# Patient Record
Sex: Female | Born: 1935 | Race: Black or African American | Hispanic: No | Smoking: Former smoker
Health system: Southern US, Community
[De-identification: ages and names within clinical notes are randomized; demographics above are authoritative.]

## PROBLEM LIST (undated history)

## (undated) DIAGNOSIS — F0390 Unspecified dementia without behavioral disturbance: Secondary | ICD-10-CM

## (undated) DIAGNOSIS — J9600 Acute respiratory failure, unspecified whether with hypoxia or hypercapnia: Secondary | ICD-10-CM

## (undated) DIAGNOSIS — J309 Allergic rhinitis, unspecified: Secondary | ICD-10-CM

## (undated) DIAGNOSIS — I4891 Unspecified atrial fibrillation: Secondary | ICD-10-CM

## (undated) DIAGNOSIS — F419 Anxiety disorder, unspecified: Secondary | ICD-10-CM

## (undated) DIAGNOSIS — J449 Chronic obstructive pulmonary disease, unspecified: Secondary | ICD-10-CM

## (undated) DIAGNOSIS — Z85038 Personal history of other malignant neoplasm of large intestine: Secondary | ICD-10-CM

## (undated) DIAGNOSIS — I10 Essential (primary) hypertension: Secondary | ICD-10-CM

## (undated) DIAGNOSIS — F39 Unspecified mood [affective] disorder: Secondary | ICD-10-CM

## (undated) DIAGNOSIS — K219 Gastro-esophageal reflux disease without esophagitis: Secondary | ICD-10-CM

## (undated) DIAGNOSIS — M810 Age-related osteoporosis without current pathological fracture: Secondary | ICD-10-CM

## (undated) DIAGNOSIS — L309 Dermatitis, unspecified: Secondary | ICD-10-CM

## (undated) DIAGNOSIS — E119 Type 2 diabetes mellitus without complications: Secondary | ICD-10-CM

## (undated) HISTORY — DX: Unspecified atrial fibrillation: I48.91

## (undated) HISTORY — DX: Gastro-esophageal reflux disease without esophagitis: K21.9

## (undated) HISTORY — DX: Anxiety disorder, unspecified: F41.9

## (undated) HISTORY — DX: Unspecified dementia, unspecified severity, without behavioral disturbance, psychotic disturbance, mood disturbance, and anxiety: F03.90

## (undated) HISTORY — DX: Age-related osteoporosis without current pathological fracture: M81.0

## (undated) HISTORY — DX: Allergic rhinitis, unspecified: J30.9

## (undated) HISTORY — DX: Acute respiratory failure, unspecified whether with hypoxia or hypercapnia: J96.00

## (undated) HISTORY — DX: Unspecified mood (affective) disorder: F39

## (undated) HISTORY — DX: Type 2 diabetes mellitus without complications: E11.9

## (undated) HISTORY — DX: Chronic obstructive pulmonary disease, unspecified: J44.9

## (undated) HISTORY — DX: Personal history of other malignant neoplasm of large intestine: Z85.038

## (undated) HISTORY — DX: Dermatitis, unspecified: L30.9

## (undated) HISTORY — DX: Essential (primary) hypertension: I10

---

## 1973-11-28 HISTORY — PX: ABDOMINAL HYSTERECTOMY: SHX81

## 2010-01-26 HISTORY — PX: UMBILICAL HERNIA REPAIR: SHX196

## 2010-01-26 HISTORY — PX: COLON SURGERY: SHX602

## 2013-04-26 DIAGNOSIS — J449 Chronic obstructive pulmonary disease, unspecified: Secondary | ICD-10-CM | POA: Diagnosis not present

## 2013-04-26 DIAGNOSIS — M25559 Pain in unspecified hip: Secondary | ICD-10-CM | POA: Diagnosis not present

## 2013-04-26 DIAGNOSIS — M25569 Pain in unspecified knee: Secondary | ICD-10-CM | POA: Diagnosis not present

## 2013-04-26 DIAGNOSIS — M545 Low back pain: Secondary | ICD-10-CM | POA: Diagnosis not present

## 2013-04-26 DIAGNOSIS — I10 Essential (primary) hypertension: Secondary | ICD-10-CM | POA: Diagnosis not present

## 2013-04-26 DIAGNOSIS — R269 Unspecified abnormalities of gait and mobility: Secondary | ICD-10-CM | POA: Diagnosis not present

## 2013-04-30 DIAGNOSIS — M25569 Pain in unspecified knee: Secondary | ICD-10-CM | POA: Diagnosis not present

## 2013-04-30 DIAGNOSIS — R269 Unspecified abnormalities of gait and mobility: Secondary | ICD-10-CM | POA: Diagnosis not present

## 2013-04-30 DIAGNOSIS — M25559 Pain in unspecified hip: Secondary | ICD-10-CM | POA: Diagnosis not present

## 2013-04-30 DIAGNOSIS — J449 Chronic obstructive pulmonary disease, unspecified: Secondary | ICD-10-CM | POA: Diagnosis not present

## 2013-04-30 DIAGNOSIS — M545 Low back pain: Secondary | ICD-10-CM | POA: Diagnosis not present

## 2013-04-30 DIAGNOSIS — I10 Essential (primary) hypertension: Secondary | ICD-10-CM | POA: Diagnosis not present

## 2013-05-07 DIAGNOSIS — M25569 Pain in unspecified knee: Secondary | ICD-10-CM | POA: Diagnosis not present

## 2013-05-07 DIAGNOSIS — M25559 Pain in unspecified hip: Secondary | ICD-10-CM | POA: Diagnosis not present

## 2013-05-07 DIAGNOSIS — I10 Essential (primary) hypertension: Secondary | ICD-10-CM | POA: Diagnosis not present

## 2013-05-07 DIAGNOSIS — R269 Unspecified abnormalities of gait and mobility: Secondary | ICD-10-CM | POA: Diagnosis not present

## 2013-05-07 DIAGNOSIS — M545 Low back pain: Secondary | ICD-10-CM | POA: Diagnosis not present

## 2013-05-07 DIAGNOSIS — J449 Chronic obstructive pulmonary disease, unspecified: Secondary | ICD-10-CM | POA: Diagnosis not present

## 2013-05-16 DIAGNOSIS — M545 Low back pain: Secondary | ICD-10-CM | POA: Diagnosis not present

## 2013-05-16 DIAGNOSIS — I10 Essential (primary) hypertension: Secondary | ICD-10-CM | POA: Diagnosis not present

## 2013-05-16 DIAGNOSIS — M25559 Pain in unspecified hip: Secondary | ICD-10-CM | POA: Diagnosis not present

## 2013-05-16 DIAGNOSIS — M25569 Pain in unspecified knee: Secondary | ICD-10-CM | POA: Diagnosis not present

## 2013-05-16 DIAGNOSIS — J449 Chronic obstructive pulmonary disease, unspecified: Secondary | ICD-10-CM | POA: Diagnosis not present

## 2013-05-16 DIAGNOSIS — R269 Unspecified abnormalities of gait and mobility: Secondary | ICD-10-CM | POA: Diagnosis not present

## 2013-05-21 DIAGNOSIS — M25559 Pain in unspecified hip: Secondary | ICD-10-CM | POA: Diagnosis not present

## 2013-05-21 DIAGNOSIS — J449 Chronic obstructive pulmonary disease, unspecified: Secondary | ICD-10-CM | POA: Diagnosis not present

## 2013-05-21 DIAGNOSIS — R269 Unspecified abnormalities of gait and mobility: Secondary | ICD-10-CM | POA: Diagnosis not present

## 2013-05-21 DIAGNOSIS — M25569 Pain in unspecified knee: Secondary | ICD-10-CM | POA: Diagnosis not present

## 2013-05-21 DIAGNOSIS — M545 Low back pain: Secondary | ICD-10-CM | POA: Diagnosis not present

## 2013-05-21 DIAGNOSIS — I10 Essential (primary) hypertension: Secondary | ICD-10-CM | POA: Diagnosis not present

## 2013-05-29 DIAGNOSIS — M25559 Pain in unspecified hip: Secondary | ICD-10-CM | POA: Diagnosis not present

## 2013-05-29 DIAGNOSIS — J449 Chronic obstructive pulmonary disease, unspecified: Secondary | ICD-10-CM | POA: Diagnosis not present

## 2013-05-29 DIAGNOSIS — R269 Unspecified abnormalities of gait and mobility: Secondary | ICD-10-CM | POA: Diagnosis not present

## 2013-05-29 DIAGNOSIS — M25569 Pain in unspecified knee: Secondary | ICD-10-CM | POA: Diagnosis not present

## 2013-05-29 DIAGNOSIS — M545 Low back pain: Secondary | ICD-10-CM | POA: Diagnosis not present

## 2013-05-29 DIAGNOSIS — I10 Essential (primary) hypertension: Secondary | ICD-10-CM | POA: Diagnosis not present

## 2013-06-03 DIAGNOSIS — R269 Unspecified abnormalities of gait and mobility: Secondary | ICD-10-CM | POA: Diagnosis not present

## 2013-06-03 DIAGNOSIS — J449 Chronic obstructive pulmonary disease, unspecified: Secondary | ICD-10-CM | POA: Diagnosis not present

## 2013-06-03 DIAGNOSIS — M25569 Pain in unspecified knee: Secondary | ICD-10-CM | POA: Diagnosis not present

## 2013-06-03 DIAGNOSIS — I10 Essential (primary) hypertension: Secondary | ICD-10-CM | POA: Diagnosis not present

## 2013-06-03 DIAGNOSIS — M25559 Pain in unspecified hip: Secondary | ICD-10-CM | POA: Diagnosis not present

## 2013-06-03 DIAGNOSIS — M545 Low back pain: Secondary | ICD-10-CM | POA: Diagnosis not present

## 2013-06-13 DIAGNOSIS — M545 Low back pain: Secondary | ICD-10-CM | POA: Diagnosis not present

## 2013-06-13 DIAGNOSIS — J449 Chronic obstructive pulmonary disease, unspecified: Secondary | ICD-10-CM | POA: Diagnosis not present

## 2013-06-13 DIAGNOSIS — I10 Essential (primary) hypertension: Secondary | ICD-10-CM | POA: Diagnosis not present

## 2013-06-13 DIAGNOSIS — M25559 Pain in unspecified hip: Secondary | ICD-10-CM | POA: Diagnosis not present

## 2013-06-13 DIAGNOSIS — R269 Unspecified abnormalities of gait and mobility: Secondary | ICD-10-CM | POA: Diagnosis not present

## 2013-06-13 DIAGNOSIS — M25569 Pain in unspecified knee: Secondary | ICD-10-CM | POA: Diagnosis not present

## 2013-06-18 DIAGNOSIS — M25559 Pain in unspecified hip: Secondary | ICD-10-CM | POA: Diagnosis not present

## 2013-06-18 DIAGNOSIS — R269 Unspecified abnormalities of gait and mobility: Secondary | ICD-10-CM | POA: Diagnosis not present

## 2013-06-18 DIAGNOSIS — M25569 Pain in unspecified knee: Secondary | ICD-10-CM | POA: Diagnosis not present

## 2013-06-18 DIAGNOSIS — J449 Chronic obstructive pulmonary disease, unspecified: Secondary | ICD-10-CM | POA: Diagnosis not present

## 2013-06-18 DIAGNOSIS — I10 Essential (primary) hypertension: Secondary | ICD-10-CM | POA: Diagnosis not present

## 2013-06-18 DIAGNOSIS — M545 Low back pain: Secondary | ICD-10-CM | POA: Diagnosis not present

## 2013-06-24 DIAGNOSIS — M25559 Pain in unspecified hip: Secondary | ICD-10-CM | POA: Diagnosis not present

## 2013-06-24 DIAGNOSIS — J449 Chronic obstructive pulmonary disease, unspecified: Secondary | ICD-10-CM | POA: Diagnosis not present

## 2013-06-24 DIAGNOSIS — M25569 Pain in unspecified knee: Secondary | ICD-10-CM | POA: Diagnosis not present

## 2013-06-24 DIAGNOSIS — R269 Unspecified abnormalities of gait and mobility: Secondary | ICD-10-CM | POA: Diagnosis not present

## 2013-06-24 DIAGNOSIS — M545 Low back pain: Secondary | ICD-10-CM | POA: Diagnosis not present

## 2013-06-24 DIAGNOSIS — I10 Essential (primary) hypertension: Secondary | ICD-10-CM | POA: Diagnosis not present

## 2014-12-02 DIAGNOSIS — H00024 Hordeolum internum left upper eyelid: Secondary | ICD-10-CM | POA: Diagnosis not present

## 2014-12-02 DIAGNOSIS — H524 Presbyopia: Secondary | ICD-10-CM | POA: Diagnosis not present

## 2014-12-02 DIAGNOSIS — H35372 Puckering of macula, left eye: Secondary | ICD-10-CM | POA: Diagnosis not present

## 2014-12-02 DIAGNOSIS — H00021 Hordeolum internum right upper eyelid: Secondary | ICD-10-CM | POA: Diagnosis not present

## 2014-12-02 DIAGNOSIS — E119 Type 2 diabetes mellitus without complications: Secondary | ICD-10-CM | POA: Diagnosis not present

## 2015-01-07 DIAGNOSIS — H6980 Other specified disorders of Eustachian tube, unspecified ear: Secondary | ICD-10-CM | POA: Diagnosis not present

## 2015-01-07 DIAGNOSIS — L219 Seborrheic dermatitis, unspecified: Secondary | ICD-10-CM | POA: Diagnosis not present

## 2015-01-07 DIAGNOSIS — H903 Sensorineural hearing loss, bilateral: Secondary | ICD-10-CM | POA: Diagnosis not present

## 2015-01-07 DIAGNOSIS — H6983 Other specified disorders of Eustachian tube, bilateral: Secondary | ICD-10-CM | POA: Diagnosis not present

## 2015-02-07 DIAGNOSIS — J449 Chronic obstructive pulmonary disease, unspecified: Secondary | ICD-10-CM | POA: Diagnosis not present

## 2015-02-07 DIAGNOSIS — Z79899 Other long term (current) drug therapy: Secondary | ICD-10-CM | POA: Diagnosis not present

## 2015-02-07 DIAGNOSIS — R040 Epistaxis: Secondary | ICD-10-CM | POA: Diagnosis not present

## 2015-02-07 DIAGNOSIS — I10 Essential (primary) hypertension: Secondary | ICD-10-CM | POA: Diagnosis not present

## 2015-02-07 DIAGNOSIS — Z7982 Long term (current) use of aspirin: Secondary | ICD-10-CM | POA: Diagnosis not present

## 2015-02-07 DIAGNOSIS — Z9981 Dependence on supplemental oxygen: Secondary | ICD-10-CM | POA: Diagnosis not present

## 2015-03-12 DIAGNOSIS — M25473 Effusion, unspecified ankle: Secondary | ICD-10-CM | POA: Diagnosis not present

## 2015-03-12 DIAGNOSIS — M79606 Pain in leg, unspecified: Secondary | ICD-10-CM | POA: Diagnosis not present

## 2015-03-12 DIAGNOSIS — I10 Essential (primary) hypertension: Secondary | ICD-10-CM | POA: Diagnosis not present

## 2015-04-08 DIAGNOSIS — I10 Essential (primary) hypertension: Secondary | ICD-10-CM | POA: Diagnosis not present

## 2015-04-08 DIAGNOSIS — J302 Other seasonal allergic rhinitis: Secondary | ICD-10-CM | POA: Diagnosis not present

## 2015-04-08 DIAGNOSIS — K6289 Other specified diseases of anus and rectum: Secondary | ICD-10-CM | POA: Diagnosis not present

## 2015-04-08 DIAGNOSIS — Z8711 Personal history of peptic ulcer disease: Secondary | ICD-10-CM | POA: Diagnosis not present

## 2015-04-08 DIAGNOSIS — Z9049 Acquired absence of other specified parts of digestive tract: Secondary | ICD-10-CM | POA: Diagnosis not present

## 2015-04-08 DIAGNOSIS — J45909 Unspecified asthma, uncomplicated: Secondary | ICD-10-CM | POA: Diagnosis not present

## 2015-04-08 DIAGNOSIS — Z86010 Personal history of colonic polyps: Secondary | ICD-10-CM | POA: Diagnosis not present

## 2015-04-08 DIAGNOSIS — Z85038 Personal history of other malignant neoplasm of large intestine: Secondary | ICD-10-CM | POA: Diagnosis not present

## 2015-04-08 DIAGNOSIS — M1990 Unspecified osteoarthritis, unspecified site: Secondary | ICD-10-CM | POA: Diagnosis not present

## 2015-04-08 DIAGNOSIS — N200 Calculus of kidney: Secondary | ICD-10-CM | POA: Diagnosis not present

## 2015-04-08 DIAGNOSIS — R1032 Left lower quadrant pain: Secondary | ICD-10-CM | POA: Diagnosis not present

## 2015-04-08 DIAGNOSIS — Z7982 Long term (current) use of aspirin: Secondary | ICD-10-CM | POA: Diagnosis not present

## 2015-04-08 DIAGNOSIS — Z79899 Other long term (current) drug therapy: Secondary | ICD-10-CM | POA: Diagnosis not present

## 2015-04-08 DIAGNOSIS — N202 Calculus of kidney with calculus of ureter: Secondary | ICD-10-CM | POA: Diagnosis not present

## 2015-04-08 DIAGNOSIS — R109 Unspecified abdominal pain: Secondary | ICD-10-CM | POA: Diagnosis not present

## 2015-04-08 DIAGNOSIS — E785 Hyperlipidemia, unspecified: Secondary | ICD-10-CM | POA: Diagnosis not present

## 2015-04-08 DIAGNOSIS — Z7951 Long term (current) use of inhaled steroids: Secondary | ICD-10-CM | POA: Diagnosis not present

## 2015-04-08 DIAGNOSIS — R110 Nausea: Secondary | ICD-10-CM | POA: Diagnosis not present

## 2015-04-08 DIAGNOSIS — E119 Type 2 diabetes mellitus without complications: Secondary | ICD-10-CM | POA: Diagnosis not present

## 2015-04-08 DIAGNOSIS — Z9981 Dependence on supplemental oxygen: Secondary | ICD-10-CM | POA: Diagnosis not present

## 2015-04-08 DIAGNOSIS — E860 Dehydration: Secondary | ICD-10-CM | POA: Diagnosis not present

## 2015-04-08 DIAGNOSIS — J449 Chronic obstructive pulmonary disease, unspecified: Secondary | ICD-10-CM | POA: Diagnosis not present

## 2015-04-08 DIAGNOSIS — K5900 Constipation, unspecified: Secondary | ICD-10-CM | POA: Diagnosis not present

## 2015-04-08 DIAGNOSIS — Z90710 Acquired absence of both cervix and uterus: Secondary | ICD-10-CM | POA: Diagnosis not present

## 2015-04-08 DIAGNOSIS — N390 Urinary tract infection, site not specified: Secondary | ICD-10-CM | POA: Diagnosis not present

## 2015-04-09 DIAGNOSIS — Z85038 Personal history of other malignant neoplasm of large intestine: Secondary | ICD-10-CM | POA: Diagnosis not present

## 2015-04-09 DIAGNOSIS — N200 Calculus of kidney: Secondary | ICD-10-CM | POA: Diagnosis not present

## 2015-04-09 DIAGNOSIS — R1032 Left lower quadrant pain: Secondary | ICD-10-CM | POA: Diagnosis not present

## 2015-04-10 DIAGNOSIS — E860 Dehydration: Secondary | ICD-10-CM | POA: Diagnosis not present

## 2015-04-10 DIAGNOSIS — N390 Urinary tract infection, site not specified: Secondary | ICD-10-CM | POA: Diagnosis not present

## 2015-04-10 DIAGNOSIS — N200 Calculus of kidney: Secondary | ICD-10-CM | POA: Diagnosis not present

## 2015-04-11 DIAGNOSIS — E860 Dehydration: Secondary | ICD-10-CM | POA: Diagnosis not present

## 2015-04-11 DIAGNOSIS — N390 Urinary tract infection, site not specified: Secondary | ICD-10-CM | POA: Diagnosis not present

## 2015-04-11 DIAGNOSIS — N200 Calculus of kidney: Secondary | ICD-10-CM | POA: Diagnosis not present

## 2015-04-12 DIAGNOSIS — J439 Emphysema, unspecified: Secondary | ICD-10-CM | POA: Diagnosis not present

## 2015-04-12 DIAGNOSIS — N200 Calculus of kidney: Secondary | ICD-10-CM | POA: Diagnosis not present

## 2015-04-12 DIAGNOSIS — N390 Urinary tract infection, site not specified: Secondary | ICD-10-CM | POA: Diagnosis not present

## 2015-04-12 DIAGNOSIS — I10 Essential (primary) hypertension: Secondary | ICD-10-CM | POA: Diagnosis not present

## 2015-04-12 DIAGNOSIS — J45909 Unspecified asthma, uncomplicated: Secondary | ICD-10-CM | POA: Diagnosis not present

## 2015-04-12 DIAGNOSIS — E119 Type 2 diabetes mellitus without complications: Secondary | ICD-10-CM | POA: Diagnosis not present

## 2015-04-15 DIAGNOSIS — J439 Emphysema, unspecified: Secondary | ICD-10-CM | POA: Diagnosis not present

## 2015-04-15 DIAGNOSIS — I10 Essential (primary) hypertension: Secondary | ICD-10-CM | POA: Diagnosis not present

## 2015-04-15 DIAGNOSIS — J45909 Unspecified asthma, uncomplicated: Secondary | ICD-10-CM | POA: Diagnosis not present

## 2015-04-15 DIAGNOSIS — N390 Urinary tract infection, site not specified: Secondary | ICD-10-CM | POA: Diagnosis not present

## 2015-04-15 DIAGNOSIS — E119 Type 2 diabetes mellitus without complications: Secondary | ICD-10-CM | POA: Diagnosis not present

## 2015-04-15 DIAGNOSIS — N200 Calculus of kidney: Secondary | ICD-10-CM | POA: Diagnosis not present

## 2015-04-16 DIAGNOSIS — N200 Calculus of kidney: Secondary | ICD-10-CM | POA: Diagnosis not present

## 2015-04-16 DIAGNOSIS — N390 Urinary tract infection, site not specified: Secondary | ICD-10-CM | POA: Diagnosis not present

## 2015-04-16 DIAGNOSIS — J45909 Unspecified asthma, uncomplicated: Secondary | ICD-10-CM | POA: Diagnosis not present

## 2015-04-16 DIAGNOSIS — J439 Emphysema, unspecified: Secondary | ICD-10-CM | POA: Diagnosis not present

## 2015-04-16 DIAGNOSIS — I10 Essential (primary) hypertension: Secondary | ICD-10-CM | POA: Diagnosis not present

## 2015-04-16 DIAGNOSIS — E119 Type 2 diabetes mellitus without complications: Secondary | ICD-10-CM | POA: Diagnosis not present

## 2015-04-18 DIAGNOSIS — E119 Type 2 diabetes mellitus without complications: Secondary | ICD-10-CM | POA: Diagnosis not present

## 2015-04-18 DIAGNOSIS — N390 Urinary tract infection, site not specified: Secondary | ICD-10-CM | POA: Diagnosis not present

## 2015-04-18 DIAGNOSIS — N200 Calculus of kidney: Secondary | ICD-10-CM | POA: Diagnosis not present

## 2015-04-18 DIAGNOSIS — I10 Essential (primary) hypertension: Secondary | ICD-10-CM | POA: Diagnosis not present

## 2015-04-18 DIAGNOSIS — J439 Emphysema, unspecified: Secondary | ICD-10-CM | POA: Diagnosis not present

## 2015-04-18 DIAGNOSIS — J45909 Unspecified asthma, uncomplicated: Secondary | ICD-10-CM | POA: Diagnosis not present

## 2015-04-21 DIAGNOSIS — I10 Essential (primary) hypertension: Secondary | ICD-10-CM | POA: Diagnosis not present

## 2015-04-21 DIAGNOSIS — J439 Emphysema, unspecified: Secondary | ICD-10-CM | POA: Diagnosis not present

## 2015-04-21 DIAGNOSIS — J45909 Unspecified asthma, uncomplicated: Secondary | ICD-10-CM | POA: Diagnosis not present

## 2015-04-21 DIAGNOSIS — N200 Calculus of kidney: Secondary | ICD-10-CM | POA: Diagnosis not present

## 2015-04-21 DIAGNOSIS — E119 Type 2 diabetes mellitus without complications: Secondary | ICD-10-CM | POA: Diagnosis not present

## 2015-04-21 DIAGNOSIS — N390 Urinary tract infection, site not specified: Secondary | ICD-10-CM | POA: Diagnosis not present

## 2015-04-23 DIAGNOSIS — J45909 Unspecified asthma, uncomplicated: Secondary | ICD-10-CM | POA: Diagnosis not present

## 2015-04-23 DIAGNOSIS — N200 Calculus of kidney: Secondary | ICD-10-CM | POA: Diagnosis not present

## 2015-04-23 DIAGNOSIS — I10 Essential (primary) hypertension: Secondary | ICD-10-CM | POA: Diagnosis not present

## 2015-04-23 DIAGNOSIS — E119 Type 2 diabetes mellitus without complications: Secondary | ICD-10-CM | POA: Diagnosis not present

## 2015-04-23 DIAGNOSIS — J439 Emphysema, unspecified: Secondary | ICD-10-CM | POA: Diagnosis not present

## 2015-04-23 DIAGNOSIS — N390 Urinary tract infection, site not specified: Secondary | ICD-10-CM | POA: Diagnosis not present

## 2015-04-24 DIAGNOSIS — Z7951 Long term (current) use of inhaled steroids: Secondary | ICD-10-CM | POA: Diagnosis not present

## 2015-04-24 DIAGNOSIS — Z79891 Long term (current) use of opiate analgesic: Secondary | ICD-10-CM | POA: Diagnosis not present

## 2015-04-24 DIAGNOSIS — S0993XA Unspecified injury of face, initial encounter: Secondary | ICD-10-CM | POA: Diagnosis not present

## 2015-04-24 DIAGNOSIS — R51 Headache: Secondary | ICD-10-CM | POA: Diagnosis not present

## 2015-04-24 DIAGNOSIS — S8001XA Contusion of right knee, initial encounter: Secondary | ICD-10-CM | POA: Diagnosis not present

## 2015-04-24 DIAGNOSIS — Z79899 Other long term (current) drug therapy: Secondary | ICD-10-CM | POA: Diagnosis not present

## 2015-04-24 DIAGNOSIS — S0083XA Contusion of other part of head, initial encounter: Secondary | ICD-10-CM | POA: Diagnosis not present

## 2015-04-24 DIAGNOSIS — W0110XA Fall on same level from slipping, tripping and stumbling with subsequent striking against unspecified object, initial encounter: Secondary | ICD-10-CM | POA: Diagnosis not present

## 2015-04-24 DIAGNOSIS — I70201 Unspecified atherosclerosis of native arteries of extremities, right leg: Secondary | ICD-10-CM | POA: Diagnosis not present

## 2015-04-24 DIAGNOSIS — M8548 Solitary bone cyst, other site: Secondary | ICD-10-CM | POA: Diagnosis not present

## 2015-04-24 DIAGNOSIS — M79662 Pain in left lower leg: Secondary | ICD-10-CM | POA: Diagnosis not present

## 2015-04-24 DIAGNOSIS — M11261 Other chondrocalcinosis, right knee: Secondary | ICD-10-CM | POA: Diagnosis not present

## 2015-04-24 DIAGNOSIS — M25561 Pain in right knee: Secondary | ICD-10-CM | POA: Diagnosis not present

## 2015-04-24 DIAGNOSIS — M79605 Pain in left leg: Secondary | ICD-10-CM | POA: Diagnosis not present

## 2015-04-25 DIAGNOSIS — I10 Essential (primary) hypertension: Secondary | ICD-10-CM | POA: Diagnosis not present

## 2015-04-25 DIAGNOSIS — J45909 Unspecified asthma, uncomplicated: Secondary | ICD-10-CM | POA: Diagnosis not present

## 2015-04-25 DIAGNOSIS — N390 Urinary tract infection, site not specified: Secondary | ICD-10-CM | POA: Diagnosis not present

## 2015-04-25 DIAGNOSIS — J439 Emphysema, unspecified: Secondary | ICD-10-CM | POA: Diagnosis not present

## 2015-04-25 DIAGNOSIS — N200 Calculus of kidney: Secondary | ICD-10-CM | POA: Diagnosis not present

## 2015-04-25 DIAGNOSIS — E119 Type 2 diabetes mellitus without complications: Secondary | ICD-10-CM | POA: Diagnosis not present

## 2015-04-28 DIAGNOSIS — I10 Essential (primary) hypertension: Secondary | ICD-10-CM | POA: Diagnosis not present

## 2015-04-28 DIAGNOSIS — E119 Type 2 diabetes mellitus without complications: Secondary | ICD-10-CM | POA: Diagnosis not present

## 2015-04-28 DIAGNOSIS — J439 Emphysema, unspecified: Secondary | ICD-10-CM | POA: Diagnosis not present

## 2015-04-28 DIAGNOSIS — N390 Urinary tract infection, site not specified: Secondary | ICD-10-CM | POA: Diagnosis not present

## 2015-04-28 DIAGNOSIS — N200 Calculus of kidney: Secondary | ICD-10-CM | POA: Diagnosis not present

## 2015-04-28 DIAGNOSIS — J45909 Unspecified asthma, uncomplicated: Secondary | ICD-10-CM | POA: Diagnosis not present

## 2015-04-29 DIAGNOSIS — E119 Type 2 diabetes mellitus without complications: Secondary | ICD-10-CM | POA: Diagnosis not present

## 2015-04-29 DIAGNOSIS — J45909 Unspecified asthma, uncomplicated: Secondary | ICD-10-CM | POA: Diagnosis not present

## 2015-04-29 DIAGNOSIS — I10 Essential (primary) hypertension: Secondary | ICD-10-CM | POA: Diagnosis not present

## 2015-04-29 DIAGNOSIS — N390 Urinary tract infection, site not specified: Secondary | ICD-10-CM | POA: Diagnosis not present

## 2015-04-29 DIAGNOSIS — N200 Calculus of kidney: Secondary | ICD-10-CM | POA: Diagnosis not present

## 2015-04-29 DIAGNOSIS — J439 Emphysema, unspecified: Secondary | ICD-10-CM | POA: Diagnosis not present

## 2015-05-02 DIAGNOSIS — N390 Urinary tract infection, site not specified: Secondary | ICD-10-CM | POA: Diagnosis not present

## 2015-05-02 DIAGNOSIS — N200 Calculus of kidney: Secondary | ICD-10-CM | POA: Diagnosis not present

## 2015-05-02 DIAGNOSIS — J439 Emphysema, unspecified: Secondary | ICD-10-CM | POA: Diagnosis not present

## 2015-05-02 DIAGNOSIS — E119 Type 2 diabetes mellitus without complications: Secondary | ICD-10-CM | POA: Diagnosis not present

## 2015-05-02 DIAGNOSIS — I10 Essential (primary) hypertension: Secondary | ICD-10-CM | POA: Diagnosis not present

## 2015-05-02 DIAGNOSIS — J45909 Unspecified asthma, uncomplicated: Secondary | ICD-10-CM | POA: Diagnosis not present

## 2015-05-05 DIAGNOSIS — I10 Essential (primary) hypertension: Secondary | ICD-10-CM | POA: Diagnosis not present

## 2015-05-05 DIAGNOSIS — J439 Emphysema, unspecified: Secondary | ICD-10-CM | POA: Diagnosis not present

## 2015-05-05 DIAGNOSIS — N200 Calculus of kidney: Secondary | ICD-10-CM | POA: Diagnosis not present

## 2015-05-05 DIAGNOSIS — N390 Urinary tract infection, site not specified: Secondary | ICD-10-CM | POA: Diagnosis not present

## 2015-05-05 DIAGNOSIS — J45909 Unspecified asthma, uncomplicated: Secondary | ICD-10-CM | POA: Diagnosis not present

## 2015-05-05 DIAGNOSIS — E119 Type 2 diabetes mellitus without complications: Secondary | ICD-10-CM | POA: Diagnosis not present

## 2015-05-07 DIAGNOSIS — I10 Essential (primary) hypertension: Secondary | ICD-10-CM | POA: Diagnosis not present

## 2015-05-07 DIAGNOSIS — E119 Type 2 diabetes mellitus without complications: Secondary | ICD-10-CM | POA: Diagnosis not present

## 2015-05-07 DIAGNOSIS — J439 Emphysema, unspecified: Secondary | ICD-10-CM | POA: Diagnosis not present

## 2015-05-07 DIAGNOSIS — J45909 Unspecified asthma, uncomplicated: Secondary | ICD-10-CM | POA: Diagnosis not present

## 2015-05-07 DIAGNOSIS — N390 Urinary tract infection, site not specified: Secondary | ICD-10-CM | POA: Diagnosis not present

## 2015-05-07 DIAGNOSIS — N200 Calculus of kidney: Secondary | ICD-10-CM | POA: Diagnosis not present

## 2015-05-12 DIAGNOSIS — I10 Essential (primary) hypertension: Secondary | ICD-10-CM | POA: Diagnosis not present

## 2015-05-12 DIAGNOSIS — J45909 Unspecified asthma, uncomplicated: Secondary | ICD-10-CM | POA: Diagnosis not present

## 2015-05-12 DIAGNOSIS — N390 Urinary tract infection, site not specified: Secondary | ICD-10-CM | POA: Diagnosis not present

## 2015-05-12 DIAGNOSIS — J439 Emphysema, unspecified: Secondary | ICD-10-CM | POA: Diagnosis not present

## 2015-05-12 DIAGNOSIS — N200 Calculus of kidney: Secondary | ICD-10-CM | POA: Diagnosis not present

## 2015-05-12 DIAGNOSIS — E119 Type 2 diabetes mellitus without complications: Secondary | ICD-10-CM | POA: Diagnosis not present

## 2015-05-13 DIAGNOSIS — E119 Type 2 diabetes mellitus without complications: Secondary | ICD-10-CM | POA: Diagnosis not present

## 2015-05-13 DIAGNOSIS — J45909 Unspecified asthma, uncomplicated: Secondary | ICD-10-CM | POA: Diagnosis not present

## 2015-05-13 DIAGNOSIS — I10 Essential (primary) hypertension: Secondary | ICD-10-CM | POA: Diagnosis not present

## 2015-05-13 DIAGNOSIS — J439 Emphysema, unspecified: Secondary | ICD-10-CM | POA: Diagnosis not present

## 2015-05-13 DIAGNOSIS — N200 Calculus of kidney: Secondary | ICD-10-CM | POA: Diagnosis not present

## 2015-05-13 DIAGNOSIS — N390 Urinary tract infection, site not specified: Secondary | ICD-10-CM | POA: Diagnosis not present

## 2015-05-15 DIAGNOSIS — N200 Calculus of kidney: Secondary | ICD-10-CM | POA: Diagnosis not present

## 2015-05-15 DIAGNOSIS — J45909 Unspecified asthma, uncomplicated: Secondary | ICD-10-CM | POA: Diagnosis not present

## 2015-05-15 DIAGNOSIS — N390 Urinary tract infection, site not specified: Secondary | ICD-10-CM | POA: Diagnosis not present

## 2015-05-15 DIAGNOSIS — I10 Essential (primary) hypertension: Secondary | ICD-10-CM | POA: Diagnosis not present

## 2015-05-15 DIAGNOSIS — E119 Type 2 diabetes mellitus without complications: Secondary | ICD-10-CM | POA: Diagnosis not present

## 2015-05-15 DIAGNOSIS — J439 Emphysema, unspecified: Secondary | ICD-10-CM | POA: Diagnosis not present

## 2015-05-19 DIAGNOSIS — E119 Type 2 diabetes mellitus without complications: Secondary | ICD-10-CM | POA: Diagnosis not present

## 2015-05-19 DIAGNOSIS — N390 Urinary tract infection, site not specified: Secondary | ICD-10-CM | POA: Diagnosis not present

## 2015-05-19 DIAGNOSIS — J439 Emphysema, unspecified: Secondary | ICD-10-CM | POA: Diagnosis not present

## 2015-05-19 DIAGNOSIS — N200 Calculus of kidney: Secondary | ICD-10-CM | POA: Diagnosis not present

## 2015-05-19 DIAGNOSIS — I10 Essential (primary) hypertension: Secondary | ICD-10-CM | POA: Diagnosis not present

## 2015-05-19 DIAGNOSIS — N202 Calculus of kidney with calculus of ureter: Secondary | ICD-10-CM | POA: Diagnosis not present

## 2015-05-19 DIAGNOSIS — J45909 Unspecified asthma, uncomplicated: Secondary | ICD-10-CM | POA: Diagnosis not present

## 2015-05-20 DIAGNOSIS — N390 Urinary tract infection, site not specified: Secondary | ICD-10-CM | POA: Diagnosis not present

## 2015-05-20 DIAGNOSIS — J439 Emphysema, unspecified: Secondary | ICD-10-CM | POA: Diagnosis not present

## 2015-05-20 DIAGNOSIS — N200 Calculus of kidney: Secondary | ICD-10-CM | POA: Diagnosis not present

## 2015-05-20 DIAGNOSIS — J45909 Unspecified asthma, uncomplicated: Secondary | ICD-10-CM | POA: Diagnosis not present

## 2015-05-20 DIAGNOSIS — I10 Essential (primary) hypertension: Secondary | ICD-10-CM | POA: Diagnosis not present

## 2015-05-20 DIAGNOSIS — E119 Type 2 diabetes mellitus without complications: Secondary | ICD-10-CM | POA: Diagnosis not present

## 2015-05-21 DIAGNOSIS — R109 Unspecified abdominal pain: Secondary | ICD-10-CM | POA: Diagnosis not present

## 2015-05-21 DIAGNOSIS — N289 Disorder of kidney and ureter, unspecified: Secondary | ICD-10-CM | POA: Diagnosis not present

## 2015-05-21 DIAGNOSIS — N200 Calculus of kidney: Secondary | ICD-10-CM | POA: Diagnosis not present

## 2015-05-22 DIAGNOSIS — J45909 Unspecified asthma, uncomplicated: Secondary | ICD-10-CM | POA: Diagnosis not present

## 2015-05-22 DIAGNOSIS — N390 Urinary tract infection, site not specified: Secondary | ICD-10-CM | POA: Diagnosis not present

## 2015-05-22 DIAGNOSIS — N200 Calculus of kidney: Secondary | ICD-10-CM | POA: Diagnosis not present

## 2015-05-22 DIAGNOSIS — I10 Essential (primary) hypertension: Secondary | ICD-10-CM | POA: Diagnosis not present

## 2015-05-22 DIAGNOSIS — E119 Type 2 diabetes mellitus without complications: Secondary | ICD-10-CM | POA: Diagnosis not present

## 2015-05-22 DIAGNOSIS — J439 Emphysema, unspecified: Secondary | ICD-10-CM | POA: Diagnosis not present

## 2015-05-25 DIAGNOSIS — R630 Anorexia: Secondary | ICD-10-CM | POA: Diagnosis not present

## 2015-05-25 DIAGNOSIS — R1013 Epigastric pain: Secondary | ICD-10-CM | POA: Diagnosis not present

## 2015-05-25 DIAGNOSIS — R109 Unspecified abdominal pain: Secondary | ICD-10-CM | POA: Diagnosis not present

## 2015-05-25 DIAGNOSIS — R634 Abnormal weight loss: Secondary | ICD-10-CM | POA: Diagnosis not present

## 2015-05-25 DIAGNOSIS — K648 Other hemorrhoids: Secondary | ICD-10-CM | POA: Diagnosis not present

## 2015-05-25 DIAGNOSIS — C189 Malignant neoplasm of colon, unspecified: Secondary | ICD-10-CM | POA: Diagnosis not present

## 2015-05-26 DIAGNOSIS — N390 Urinary tract infection, site not specified: Secondary | ICD-10-CM | POA: Diagnosis not present

## 2015-05-26 DIAGNOSIS — J45909 Unspecified asthma, uncomplicated: Secondary | ICD-10-CM | POA: Diagnosis not present

## 2015-05-26 DIAGNOSIS — I10 Essential (primary) hypertension: Secondary | ICD-10-CM | POA: Diagnosis not present

## 2015-05-26 DIAGNOSIS — N200 Calculus of kidney: Secondary | ICD-10-CM | POA: Diagnosis not present

## 2015-05-26 DIAGNOSIS — J439 Emphysema, unspecified: Secondary | ICD-10-CM | POA: Diagnosis not present

## 2015-05-26 DIAGNOSIS — E119 Type 2 diabetes mellitus without complications: Secondary | ICD-10-CM | POA: Diagnosis not present

## 2015-05-27 DIAGNOSIS — E119 Type 2 diabetes mellitus without complications: Secondary | ICD-10-CM | POA: Diagnosis not present

## 2015-05-27 DIAGNOSIS — I10 Essential (primary) hypertension: Secondary | ICD-10-CM | POA: Diagnosis not present

## 2015-05-27 DIAGNOSIS — J439 Emphysema, unspecified: Secondary | ICD-10-CM | POA: Diagnosis not present

## 2015-05-27 DIAGNOSIS — N200 Calculus of kidney: Secondary | ICD-10-CM | POA: Diagnosis not present

## 2015-05-27 DIAGNOSIS — R634 Abnormal weight loss: Secondary | ICD-10-CM | POA: Diagnosis not present

## 2015-05-27 DIAGNOSIS — J45909 Unspecified asthma, uncomplicated: Secondary | ICD-10-CM | POA: Diagnosis not present

## 2015-05-27 DIAGNOSIS — N390 Urinary tract infection, site not specified: Secondary | ICD-10-CM | POA: Diagnosis not present

## 2015-05-28 DIAGNOSIS — N390 Urinary tract infection, site not specified: Secondary | ICD-10-CM | POA: Diagnosis not present

## 2015-05-28 DIAGNOSIS — N200 Calculus of kidney: Secondary | ICD-10-CM | POA: Diagnosis not present

## 2015-05-28 DIAGNOSIS — J45909 Unspecified asthma, uncomplicated: Secondary | ICD-10-CM | POA: Diagnosis not present

## 2015-05-28 DIAGNOSIS — E119 Type 2 diabetes mellitus without complications: Secondary | ICD-10-CM | POA: Diagnosis not present

## 2015-05-28 DIAGNOSIS — J439 Emphysema, unspecified: Secondary | ICD-10-CM | POA: Diagnosis not present

## 2015-05-28 DIAGNOSIS — I10 Essential (primary) hypertension: Secondary | ICD-10-CM | POA: Diagnosis not present

## 2015-07-10 DIAGNOSIS — K259 Gastric ulcer, unspecified as acute or chronic, without hemorrhage or perforation: Secondary | ICD-10-CM | POA: Diagnosis not present

## 2015-07-10 DIAGNOSIS — R1013 Epigastric pain: Secondary | ICD-10-CM | POA: Diagnosis not present

## 2015-07-10 DIAGNOSIS — R634 Abnormal weight loss: Secondary | ICD-10-CM | POA: Diagnosis not present

## 2015-07-10 DIAGNOSIS — K449 Diaphragmatic hernia without obstruction or gangrene: Secondary | ICD-10-CM | POA: Diagnosis not present

## 2015-07-10 DIAGNOSIS — K2930 Chronic superficial gastritis without bleeding: Secondary | ICD-10-CM | POA: Diagnosis not present

## 2015-07-10 DIAGNOSIS — K2900 Acute gastritis without bleeding: Secondary | ICD-10-CM | POA: Diagnosis not present

## 2015-07-10 DIAGNOSIS — K253 Acute gastric ulcer without hemorrhage or perforation: Secondary | ICD-10-CM | POA: Diagnosis not present

## 2015-07-10 DIAGNOSIS — K319 Disease of stomach and duodenum, unspecified: Secondary | ICD-10-CM | POA: Diagnosis not present

## 2015-07-10 DIAGNOSIS — K2970 Gastritis, unspecified, without bleeding: Secondary | ICD-10-CM | POA: Diagnosis not present

## 2015-10-27 DIAGNOSIS — N200 Calculus of kidney: Secondary | ICD-10-CM | POA: Diagnosis not present

## 2015-10-27 DIAGNOSIS — R109 Unspecified abdominal pain: Secondary | ICD-10-CM | POA: Diagnosis not present

## 2015-11-10 DIAGNOSIS — N200 Calculus of kidney: Secondary | ICD-10-CM | POA: Diagnosis not present

## 2015-11-10 DIAGNOSIS — N281 Cyst of kidney, acquired: Secondary | ICD-10-CM | POA: Diagnosis not present

## 2015-12-08 DIAGNOSIS — H04123 Dry eye syndrome of bilateral lacrimal glands: Secondary | ICD-10-CM | POA: Diagnosis not present

## 2015-12-08 DIAGNOSIS — H1045 Other chronic allergic conjunctivitis: Secondary | ICD-10-CM | POA: Diagnosis not present

## 2015-12-08 DIAGNOSIS — H35373 Puckering of macula, bilateral: Secondary | ICD-10-CM | POA: Diagnosis not present

## 2015-12-08 DIAGNOSIS — H00024 Hordeolum internum left upper eyelid: Secondary | ICD-10-CM | POA: Diagnosis not present

## 2015-12-16 DIAGNOSIS — N200 Calculus of kidney: Secondary | ICD-10-CM | POA: Diagnosis not present

## 2015-12-27 DIAGNOSIS — M25569 Pain in unspecified knee: Secondary | ICD-10-CM | POA: Diagnosis not present

## 2015-12-27 DIAGNOSIS — J439 Emphysema, unspecified: Secondary | ICD-10-CM | POA: Diagnosis not present

## 2015-12-27 DIAGNOSIS — M25561 Pain in right knee: Secondary | ICD-10-CM | POA: Diagnosis not present

## 2015-12-27 DIAGNOSIS — M79604 Pain in right leg: Secondary | ICD-10-CM | POA: Diagnosis not present

## 2015-12-27 DIAGNOSIS — J45909 Unspecified asthma, uncomplicated: Secondary | ICD-10-CM | POA: Diagnosis not present

## 2015-12-27 DIAGNOSIS — L03115 Cellulitis of right lower limb: Secondary | ICD-10-CM | POA: Diagnosis not present

## 2015-12-27 DIAGNOSIS — Z7951 Long term (current) use of inhaled steroids: Secondary | ICD-10-CM | POA: Diagnosis not present

## 2015-12-27 DIAGNOSIS — Z7982 Long term (current) use of aspirin: Secondary | ICD-10-CM | POA: Diagnosis not present

## 2015-12-27 DIAGNOSIS — Z79899 Other long term (current) drug therapy: Secondary | ICD-10-CM | POA: Diagnosis not present

## 2016-01-25 DIAGNOSIS — S2242XD Multiple fractures of ribs, left side, subsequent encounter for fracture with routine healing: Secondary | ICD-10-CM | POA: Diagnosis not present

## 2016-01-25 DIAGNOSIS — J439 Emphysema, unspecified: Secondary | ICD-10-CM | POA: Diagnosis not present

## 2016-02-16 LAB — HM DEXA SCAN

## 2016-06-10 DIAGNOSIS — H6981 Other specified disorders of Eustachian tube, right ear: Secondary | ICD-10-CM | POA: Diagnosis not present

## 2016-06-10 DIAGNOSIS — J449 Chronic obstructive pulmonary disease, unspecified: Secondary | ICD-10-CM | POA: Diagnosis not present

## 2016-06-10 DIAGNOSIS — M79604 Pain in right leg: Secondary | ICD-10-CM | POA: Diagnosis not present

## 2016-09-21 DIAGNOSIS — H9201 Otalgia, right ear: Secondary | ICD-10-CM | POA: Diagnosis not present

## 2016-09-21 DIAGNOSIS — H903 Sensorineural hearing loss, bilateral: Secondary | ICD-10-CM | POA: Diagnosis not present

## 2016-09-21 DIAGNOSIS — R0982 Postnasal drip: Secondary | ICD-10-CM | POA: Diagnosis not present

## 2016-09-21 DIAGNOSIS — J301 Allergic rhinitis due to pollen: Secondary | ICD-10-CM | POA: Diagnosis not present

## 2016-12-16 DIAGNOSIS — E119 Type 2 diabetes mellitus without complications: Secondary | ICD-10-CM | POA: Diagnosis not present

## 2016-12-16 DIAGNOSIS — H1045 Other chronic allergic conjunctivitis: Secondary | ICD-10-CM | POA: Diagnosis not present

## 2016-12-16 DIAGNOSIS — H18452 Nodular corneal degeneration, left eye: Secondary | ICD-10-CM | POA: Diagnosis not present

## 2016-12-16 DIAGNOSIS — H35373 Puckering of macula, bilateral: Secondary | ICD-10-CM | POA: Diagnosis not present

## 2017-01-05 DIAGNOSIS — R319 Hematuria, unspecified: Secondary | ICD-10-CM | POA: Diagnosis not present

## 2017-01-05 DIAGNOSIS — R410 Disorientation, unspecified: Secondary | ICD-10-CM | POA: Diagnosis not present

## 2017-01-05 DIAGNOSIS — Z79899 Other long term (current) drug therapy: Secondary | ICD-10-CM | POA: Diagnosis not present

## 2017-01-05 DIAGNOSIS — N132 Hydronephrosis with renal and ureteral calculous obstruction: Secondary | ICD-10-CM | POA: Diagnosis not present

## 2017-01-05 DIAGNOSIS — R8271 Bacteriuria: Secondary | ICD-10-CM | POA: Diagnosis not present

## 2017-01-05 DIAGNOSIS — Z7982 Long term (current) use of aspirin: Secondary | ICD-10-CM | POA: Diagnosis not present

## 2017-01-05 DIAGNOSIS — R4182 Altered mental status, unspecified: Secondary | ICD-10-CM | POA: Diagnosis not present

## 2017-02-08 DIAGNOSIS — N289 Disorder of kidney and ureter, unspecified: Secondary | ICD-10-CM | POA: Diagnosis not present

## 2017-02-08 DIAGNOSIS — N200 Calculus of kidney: Secondary | ICD-10-CM | POA: Diagnosis not present

## 2017-03-20 ENCOUNTER — Other Ambulatory Visit (INDEPENDENT_AMBULATORY_CARE_PROVIDER_SITE_OTHER): Payer: Medicare Other

## 2017-03-20 ENCOUNTER — Ambulatory Visit (INDEPENDENT_AMBULATORY_CARE_PROVIDER_SITE_OTHER)
Admission: RE | Admit: 2017-03-20 | Discharge: 2017-03-20 | Disposition: A | Discharge status: Home / Self Care | Payer: Medicare Other | Source: Ambulatory Visit | Attending: Nurse Practitioner | Admitting: Nurse Practitioner

## 2017-03-20 ENCOUNTER — Encounter: Payer: Self-pay | Admitting: Nurse Practitioner

## 2017-03-20 ENCOUNTER — Ambulatory Visit (INDEPENDENT_AMBULATORY_CARE_PROVIDER_SITE_OTHER): Payer: Medicare Other | Admitting: Nurse Practitioner

## 2017-03-20 VITALS — BP 130/60 | HR 61 | Temp 98.4°F | Ht 63.0 in | Wt 111.0 lb

## 2017-03-20 DIAGNOSIS — J449 Chronic obstructive pulmonary disease, unspecified: Secondary | ICD-10-CM | POA: Diagnosis not present

## 2017-03-20 DIAGNOSIS — J310 Chronic rhinitis: Secondary | ICD-10-CM

## 2017-03-20 DIAGNOSIS — R058 Other specified cough: Secondary | ICD-10-CM

## 2017-03-20 DIAGNOSIS — R05 Cough: Secondary | ICD-10-CM

## 2017-03-20 DIAGNOSIS — R404 Transient alteration of awareness: Secondary | ICD-10-CM

## 2017-03-20 DIAGNOSIS — Z9981 Dependence on supplemental oxygen: Secondary | ICD-10-CM

## 2017-03-20 LAB — COMPREHENSIVE METABOLIC PANEL
ALT: 12 U/L (ref 0–35)
AST: 19 U/L (ref 0–37)
Albumin: 3.9 g/dL (ref 3.5–5.2)
Alkaline Phosphatase: 57 U/L (ref 39–117)
BILIRUBIN TOTAL: 0.7 mg/dL (ref 0.2–1.2)
BUN: 15 mg/dL (ref 6–23)
CALCIUM: 10.2 mg/dL (ref 8.4–10.5)
CHLORIDE: 103 meq/L (ref 96–112)
CO2: 37 meq/L — AB (ref 19–32)
Creatinine, Ser: 0.7 mg/dL (ref 0.40–1.20)
GFR: 101 mL/min (ref >60.00)
Glucose, Bld: 88 mg/dL (ref 70–99)
POTASSIUM: 4.6 meq/L (ref 3.5–5.1)
Sodium: 143 mEq/L (ref 135–145)
Total Protein: 6.5 g/dL (ref 6.0–8.3)

## 2017-03-20 LAB — CBC WITH DIFFERENTIAL/PLATELET
BASOS PCT: 0.5 % (ref 0.0–3.0)
Basophils Absolute: 0 10*3/uL (ref 0.0–0.1)
Eosinophils Absolute: 0.4 10*3/uL (ref 0.0–0.7)
Eosinophils Relative: 7.6 % — ABNORMAL HIGH (ref 0.0–5.0)
HEMATOCRIT: 38.7 % (ref 36.0–46.0)
Hemoglobin: 12.3 g/dL (ref 12.0–15.0)
LYMPHS ABS: 1.3 10*3/uL (ref 0.7–4.0)
LYMPHS PCT: 22.5 % (ref 12.0–46.0)
MCHC: 31.8 g/dL (ref 30.0–36.0)
MCV: 90.2 fl (ref 78.0–100.0)
MONOS PCT: 10.9 % (ref 3.0–12.0)
Monocytes Absolute: 0.6 10*3/uL (ref 0.1–1.0)
NEUTROS ABS: 3.3 10*3/uL (ref 1.4–7.7)
Neutrophils Relative %: 58.5 % (ref 43.0–77.0)
PLATELETS: 164 10*3/uL (ref 150.0–400.0)
RBC: 4.29 Mil/uL (ref 3.87–5.11)
RDW: 13.1 % (ref 11.5–15.5)
WBC: 5.6 10*3/uL (ref 4.0–10.5)

## 2017-03-20 LAB — TSH: TSH: 0.84 u[IU]/mL (ref 0.35–4.50)

## 2017-03-20 NOTE — Patient Instructions (Signed)
You will be contacted with appt to pulmonology.  Sign medical release to get records from previous pcp.

## 2017-03-20 NOTE — Progress Notes (Signed)
Pre visit review using our clinic review tool, if applicable. No additional management support is needed unless otherwise documented below in the visit note. 

## 2017-03-20 NOTE — Progress Notes (Signed)
Subjective:    Patient ID: Samantha Zamora, female    DOB: [birth year], 83 y.o.   MRN: [id number]  Patient presents today for establish care (new patient).  HPI  In office with son: Samantha Zamora  from Dr.  Saul Fordyce to [place]. Lives with son Samantha Zamora. Has daughter in [place].  Previous pcp with [place] seen 38months ago.  Suspected Dementia: No official diagnosis. No previous screening done  Insomnia: Sleep for 5hrs Premature waking  Naps through the day. Wakes up hyperventilating and feels like she can not breath. Witnessed by son and daughter-in-law.  COPD: Previous tobacco use. O2 dependent. advair and spiriva (not used as directed) Dependent on albuterol rescue inhaler. Uses About 20times.  Nasal congestion: treated with saline spray. Due to nasal canula  Right ear pain: Onset 09/2016.  Immunizations: (TDAP, Hep C screen, Pneumovax, Influenza, zoster)  Health Maintenance  Topic Date Due  . Tetanus Vaccine  10/13/1945  . DEXA scan (bone density measurement)  10/14/1991  . Pneumonia vaccines (1 of 2 - PCV13) 10/14/1991  . Flu Shot  06/28/2017   Weight:  Wt Readings from Last 3 Encounters:  03/20/17 111 lb (50.3 kg)   Fall Risk: Fall Risk  03/20/2017  Falls in the past year? Yes  Number falls in past yr: 1  Injury with Fall? No   Home Safety:ho e with son and daughter in law Depression/Suicide: Depression screen [facility] 2/9 03/20/2017  Decreased Interest 0  Down, Depressed, Hopeless 0  PHQ - 2 Score 0   Medications and allergies reviewed with patient and updated if appropriate.  Patient Active Problem List   Diagnosis Date Noted  . Chronic rhinitis 03/24/2017  . Transient alteration of awareness 03/24/2017  . 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 COPD (chronic obstructive pulmonary disease) ([place]) 03/20/2017    No current outpatient prescriptions on file prior to visit.   No current facility-administered medications on file prior to visit.      Past Medical History:  Diagnosis Date  . COPD (chronic obstructive pulmonary disease) ([place])   . Dementia    early stage    Past Surgical History:  Procedure Laterality Date  . COLON SURGERY    . KIDNEY STONE SURGERY      Social History   Social History  . Marital status: Widowed    Spouse name: N/A  . Number of children: N/A  . Years of education: N/A   Social History Main Topics  . Smoking status: Never Smoker  . Smokeless tobacco: Never Used  . Alcohol use No  . Drug use: No  . Sexual activity: Not Asked   Other Topics Concern  . None   Social History Narrative  . None    History reviewed. No pertinent family history.      ROS  Objective:   Vitals:   03/20/17 1436  BP: 130/60  Pulse: 61  Temp: 98.4 F (36.9 C)    Body mass index is 19.66 kg/m.   Physical Examination:  Physical Exam  Constitutional: She is oriented to person, place, and time. No distress.  Neck: Normal range of motion. Neck supple.  Cardiovascular: Normal rate, regular rhythm and normal heart sounds.   Pulmonary/Chest: Effort normal. No respiratory distress.  Diminished lung sounds  Musculoskeletal: She exhibits no edema.  Neurological: She is alert and oriented to person, place, and time.  Wheelchair  Skin: Skin is warm and dry.  Vitals reviewed.   ASSESSMENT and PLAN:  [name] was seen today  for establish care.  Diagnoses and all orders for this visit:  Transient alteration of awareness -     CBC w/Diff; Future -     Comprehensive metabolic panel; Future -     TSH; Future -     Ambulatory referral to Neurology  Chronic obstructive pulmonary disease, unspecified COPD type ([place]) -     Ambulatory referral to Pulmonology -     theophylline (THEODUR) 100 MG 12 hr tablet; Take 1 tablet (100 mg total) by mouth 2 (two) times daily. -     Fluticasone-Umeclidin-Vilant (TRELEGY ELLIPTA) 100-62.5-25 MCG/INH AEPB; Inhale 1 Inhaler into the lungs daily.  Cough with  sputum -     DG Chest 2 View; Future  Chronic rhinitis -     ipratropium (ATROVENT) 0.03 % nasal spray; Place 2 sprays into both nostrils 2 (two) times daily. Do not use for more than 5days.  O2 dependent   No problem-specific Assessment & Plan notes found for this encounter.     Follow up: Return in about 1 month (around 04/19/2017) for COPD and possible dementia.  Wilfred Lacy, NP

## 2017-03-24 DIAGNOSIS — J310 Chronic rhinitis: Secondary | ICD-10-CM | POA: Insufficient documentation

## 2017-03-24 DIAGNOSIS — Z9981 Dependence on supplemental oxygen: Secondary | ICD-10-CM | POA: Insufficient documentation

## 2017-03-24 DIAGNOSIS — R404 Transient alteration of awareness: Secondary | ICD-10-CM | POA: Insufficient documentation

## 2017-03-24 MED ORDER — FLUTICASONE-UMECLIDIN-VILANT 100-62.5-25 MCG/INH IN AEPB
1.0000 | INHALATION_SPRAY | Freq: Every day | RESPIRATORY_TRACT | 1 refills | Status: DC
Start: 2017-03-24 — End: 2017-03-28

## 2017-03-24 MED ORDER — THEOPHYLLINE ER 100 MG PO TB12: 100.0000 mg | ORAL_TABLET | Freq: Two times a day (BID) | ORAL | 1 refills | Status: DC

## 2017-03-24 MED ORDER — IPRATROPIUM BROMIDE 0.03 % NA SOLN: 2.0000 | Freq: Two times a day (BID) | NASAL | 2 refills | Status: DC

## 2017-03-27 ENCOUNTER — Encounter: Payer: Self-pay | Admitting: Neurology

## 2017-03-28 ENCOUNTER — Ambulatory Visit (INDEPENDENT_AMBULATORY_CARE_PROVIDER_SITE_OTHER): Payer: Medicare Other | Admitting: Pulmonary Disease

## 2017-03-28 ENCOUNTER — Encounter: Payer: Self-pay | Admitting: Pulmonary Disease

## 2017-03-28 DIAGNOSIS — J449 Chronic obstructive pulmonary disease, unspecified: Secondary | ICD-10-CM | POA: Diagnosis not present

## 2017-03-28 MED ORDER — FLUTICASONE-UMECLIDIN-VILANT 100-62.5-25 MCG/INH IN AEPB: 1.0000 | INHALATION_SPRAY | Freq: Every day | RESPIRATORY_TRACT | 11 refills | Status: DC

## 2017-03-28 NOTE — Addendum Note (Signed)
Addended by: Maryanna Shape A on: 03/28/2017 02:50 PM   Modules accepted: Orders

## 2017-03-28 NOTE — Progress Notes (Addendum)
Samantha Zamora    [id number]    January 08, 1924  Primary Care Physician:Charlotte Nche, NP  Referring Physician: Flossie Buffy, NP [address], [place] [address]  Chief complaint:  Consult for evaluation of COPD  HPI: Samantha Zamora has history of dementia, COPD (CAT score 28). She has been diagnosed a few years ago and is maintained on Advair and Spiriva, supplemental oxygen at 2-3 L. She gets most of her care at [facility] and is planning on moving here to be with her son. She was seen by primary care past week and started on Trilogy inhaler instead of Advair and Spiriva. She started this 2 days ago and has not noticed any alteration in her dyspnea. She is also given a new prescription for theophylline, she was not on theophylline before this. She has not started the theophylline as it was not available at the drugstore.  She reports chronic dyspnea on exertion and at res, cough, nonproductive in nature, no wheeze, fevers, chills.she reports awakening at night with dyspnea.  Outpatient Encounter Prescriptions as of 03/28/2017  Medication Sig  . Acetaminophen (TYLENOL) 325 MG CAPS Take 325 mg by mouth as needed.  Marland Kitchen albuterol (PROVENTIL HFA;VENTOLIN HFA) 108 (90 Base) MCG/ACT inhaler Inhale into the lungs every 6 (six) hours as needed for wheezing or shortness of breath.  Marland Kitchen aspirin EC 81 MG tablet Take 81 mg by mouth daily.  . fluticasone (FLONASE) 50 MCG/ACT nasal spray Place 1 spray into the nose. 50 mcg  . Fluticasone-Umeclidin-Vilant (TRELEGY ELLIPTA) 100-62.5-25 MCG/INH AEPB Inhale 1 Inhaler into the lungs daily.  Marland Kitchen ipratropium (ATROVENT) 0.03 % nasal spray Place 2 sprays into both nostrils 2 (two) times daily. Do not use for more than 5days.  . Loratadine (CLARITIN PO) Take by mouth.  . theophylline (THEODUR) 100 MG 12 hr tablet Take 1 tablet (100 mg total) by mouth 2 (two) times daily.   No facility-administered encounter medications on file as of 03/28/2017.      Allergies as of 03/28/2017 - Review Complete 03/28/2017  Allergen Reaction Noted  . Ibuprofen  06/10/2016    Past Medical History:  Diagnosis Date  . COPD (chronic obstructive pulmonary disease) ([place])   . Dementia    early stage    Past Surgical History:  Procedure Laterality Date  . COLON SURGERY      Family History  Problem Relation Age of Onset  . Family history unknown: Yes    Social History   Social History  . Marital status: Widowed    Spouse name: N/A  . Number of children: N/A  . Years of education: N/A   Occupational History  . Not on file.   Social History Main Topics  . Smoking status: Former Smoker    Packs/day: 2.00    Years: 30.00  . Smokeless tobacco: Never Used     Comment: quit smoking over 50 years ago  . Alcohol use No  . Drug use: No  . Sexual activity: Not on file   Other Topics Concern  . Not on file   Social History Narrative  . No narrative on file    Review of systems: Review of Systems  Constitutional: Negative for fever and chills.  HENT: Negative.   Eyes: Negative for blurred vision.  Respiratory: as per HPI  Cardiovascular: Negative for chest pain and palpitations.  Gastrointestinal: Negative for vomiting, diarrhea, blood per rectum. Genitourinary: Negative for dysuria, urgency, frequency and hematuria.  Musculoskeletal: Negative for myalgias, back pain and joint pain.  Skin: Negative for itching and rash.  Neurological: Negative for dizziness, tremors, focal weakness, seizures and loss of consciousness.  Endo/Heme/Allergies: Negative for environmental allergies.  Psychiatric/Behavioral: Negative for depression, suicidal ideas and hallucinations.  All other systems reviewed and are negative.  Physical Exam: Blood pressure 118/64, pulse 63, height 5\' 3"  (1.6 m), weight 114 lb (51.7 kg), SpO2 96 %. Gen:      No acute distress HEENT:  EOMI, sclera anicteric Neck:     No masses; no thyromegaly Lungs:    Clear to  auscultation bilaterally; normal respiratory effort CV:         Regular rate and rhythm; no murmurs Abd:      + bowel sounds; soft, non-tender; no palpable masses, no distension Ext:    No edema; adequate peripheral perfusion Skin:      Warm and dry; no rash Neuro: alert and oriented x 3 Psych: normal mood and affect  Data Reviewed: Chest x-ray 03/20/17- hyperinflated lungs, no acute lung infiltrate. I had reviewed the images personally.  CBC    Component Value Date/Time   WBC 5.6 03/20/2017 1606   RBC 4.29 03/20/2017 1606   HGB 12.3 03/20/2017 1606   HCT 38.7 03/20/2017 1606   PLT 164.0 03/20/2017 1606   MCV 90.2 03/20/2017 1606   MCHC 31.8 03/20/2017 1606   RDW 13.1 03/20/2017 1606   LYMPHSABS 1.3 03/20/2017 1606   MONOABS 0.6 03/20/2017 1606   EOSABS 0.4 03/20/2017 1606   BASOSABS 0.0 03/20/2017 1606    Assessment:  COPD She's been recently switched should Trelegy. We will continue the same inhalers and supplemental oxygen. She has been given theophylline but as per the family she's never been on this medication before. I have asked them to hold off on this for now. We'll get pulmonary function test for evaluation of lung function. CBC noted from last month with elevated eosinophils. This is of unclear etiology. We will continue to follow.  Plan/Recommendations: - Prescription for trelegy. Stop theophylline - Continue supplemental O2 - PFTs   Marshell Garfinkel MD  Pulmonary and Critical Care Pager [phone] 03/28/2017, 1:52 PM  CC: Nche, [name], NP =  /

## 2017-03-28 NOTE — Patient Instructions (Signed)
We will give a prescription for trelegy  Schedule PFTs Do not take the theophylline  Return in 3 months

## 2017-03-28 NOTE — Addendum Note (Signed)
Addended by: Maryanna Shape A on: 03/28/2017 02:59 PM   Modules accepted: Orders

## 2017-03-30 ENCOUNTER — Ambulatory Visit (INDEPENDENT_AMBULATORY_CARE_PROVIDER_SITE_OTHER): Payer: Medicare Other | Admitting: Pulmonary Disease

## 2017-03-30 ENCOUNTER — Telehealth: Payer: Self-pay | Admitting: Pulmonary Disease

## 2017-03-30 DIAGNOSIS — J449 Chronic obstructive pulmonary disease, unspecified: Secondary | ICD-10-CM | POA: Diagnosis not present

## 2017-03-30 NOTE — Telephone Encounter (Signed)
Spoke with pt's son Samantha Zamora (dpr on file), wants to verify that we have placed an order to [facility] for a POC.  I verified that this order was placed.  Nothing further needed.

## 2017-03-30 NOTE — Progress Notes (Signed)
PFT done today. 

## 2017-03-31 ENCOUNTER — Ambulatory Visit (INDEPENDENT_AMBULATORY_CARE_PROVIDER_SITE_OTHER): Payer: Medicare Other | Admitting: Neurology

## 2017-03-31 ENCOUNTER — Encounter: Payer: Self-pay | Admitting: Neurology

## 2017-03-31 ENCOUNTER — Other Ambulatory Visit: Payer: Self-pay | Admitting: Neurology

## 2017-03-31 VITALS — BP 114/58 | HR 91 | Temp 98.0°F | Ht 63.0 in | Wt 114.0 lb

## 2017-03-31 DIAGNOSIS — F419 Anxiety disorder, unspecified: Secondary | ICD-10-CM | POA: Insufficient documentation

## 2017-03-31 DIAGNOSIS — F3289 Other specified depressive episodes: Secondary | ICD-10-CM

## 2017-03-31 DIAGNOSIS — F329 Major depressive disorder, single episode, unspecified: Secondary | ICD-10-CM | POA: Insufficient documentation

## 2017-03-31 DIAGNOSIS — F0390 Unspecified dementia without behavioral disturbance: Secondary | ICD-10-CM

## 2017-03-31 DIAGNOSIS — F03A0 Unspecified dementia, mild, without behavioral disturbance, psychotic disturbance, mood disturbance, and anxiety: Secondary | ICD-10-CM

## 2017-03-31 DIAGNOSIS — F32A Depression, unspecified: Secondary | ICD-10-CM | POA: Insufficient documentation

## 2017-03-31 MED ORDER — MIRTAZAPINE 15 MG PO TABS: ORAL_TABLET | ORAL | 3 refills | Status: DC

## 2017-03-31 MED ORDER — MIRTAZAPINE 15 MG PO TABS: ORAL_TABLET | ORAL | 6 refills | Status: DC

## 2017-03-31 NOTE — Patient Instructions (Signed)
1. Start mirtazapine 15mg : take 1/2 tablet at night for 2 weeks, if doing well stay on the same dose. If still with issues, increase to 1 tablet at night 2. Physical exercise and brain stimulation exercises are important for brain health 3. Follow-up in 6 months, call for any changes
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
## 2017-04-07 ENCOUNTER — Telehealth: Payer: Self-pay | Admitting: Pulmonary Disease

## 2017-04-07 LAB — PULMONARY FUNCTION TEST
FEF 25-75 POST: 0.32 L/s
FEF 25-75 Pre: 0.24 L/sec
FEF2575-%CHANGE-POST: 32 %
FEF2575-%PRED-POST: 30 %
FEF2575-%Pred-Pre: 22 %
FEV1-%CHANGE-POST: -2 %
FEV1-%Pred-Post: 42 %
FEV1-%Pred-Pre: 43 %
FEV1-Post: 0.61 L
FEV1-Pre: 0.63 L
FEV1FVC-%Change-Post: -14 %
FEV1FVC-%PRED-PRE: 72 %
FEV6-%Change-Post: 21 %
FEV6-%Pred-Post: 75 %
FEV6-%Pred-Pre: 62 %
FEV6-POST: 1.34 L
FEV6-Pre: 1.1 L
FEV6FVC-%Change-Post: 4 %
FEV6FVC-%PRED-POST: 102 %
FEV6FVC-%Pred-Pre: 98 %
FVC-%Change-Post: 13 %
FVC-%PRED-PRE: 64 %
FVC-%Pred-Post: 73 %
FVC-POST: 1.35 L
FVC-PRE: 1.19 L
POST FEV1/FVC RATIO: 45 %
PRE FEV6/FVC RATIO: 95 %
Post FEV6/FVC ratio: 99 %
Pre FEV1/FVC ratio: 53 %

## 2017-04-07 NOTE — Telephone Encounter (Signed)
lmomtcb x 1 jerome (pts son)

## 2017-04-07 NOTE — Telephone Encounter (Signed)
PM I spoke with Daneil Dan and she stated that if you go into breeze you can review and sign it and it should flow over into epic.  If not then let us know and we can get the copy printed off and place this in your look at folder.  thanks

## 2017-04-07 NOTE — Telephone Encounter (Signed)
PFTs confirm severe COPD. Continue current therapy with inhalers.  PM

## 2017-04-07 NOTE — Telephone Encounter (Signed)
I don't see the PFT test in EPIC or my in box. Please call PFTs lab to verify it was uploaded.  Samantha Zamora

## 2017-04-07 NOTE — Telephone Encounter (Signed)
pts son is calling for the results of the pts PFT.  PM please advise. thanks

## 2017-04-10 NOTE — Telephone Encounter (Signed)
Called and spoke with pts son jerome and he is aware of results per PM.

## 2017-04-11 ENCOUNTER — Telehealth: Payer: Self-pay | Admitting: Pulmonary Disease

## 2017-04-11 DIAGNOSIS — J449 Chronic obstructive pulmonary disease, unspecified: Secondary | ICD-10-CM

## 2017-04-11 NOTE — Telephone Encounter (Signed)
Called and spoke to another rep from [employer]. She states they are going to fax over a form that needs to be signed by PM for pt's O2.   Will forward to [facility] to look out for.

## 2017-04-17 NOTE — Telephone Encounter (Signed)
Margie have you received the forms yet?

## 2017-04-19 NOTE — Telephone Encounter (Signed)
Form has not been received. I have attempted to call [facility] and was placed on a long hold. Will call back.

## 2017-04-20 NOTE — Telephone Encounter (Signed)
Called and spoke with Samantha Zamora and they will fax over the forms to the triage fax.

## 2017-04-25 NOTE — Telephone Encounter (Signed)
Samantha Zamora have any forms from [place] been received as of today for PM to sign.  Thanks

## 2017-04-26 NOTE — Telephone Encounter (Signed)
I have not received any forms from [place].

## 2017-04-27 ENCOUNTER — Ambulatory Visit: Payer: TRICARE For Life (TFL) | Admitting: Pulmonary Disease

## 2017-04-27 NOTE — Telephone Encounter (Signed)
Samantha Zamora have you received any forms from [place] on this pt?

## 2017-05-01 NOTE — Telephone Encounter (Signed)
Called [facility] and spoke with Tammy who reported that there is nothing outstanding for this patient.  All Huey Romans needs is a prescription for the POC (though this order has already been placed).  Will place an additional for POC to see if this takes care of the issue.    Order placed Will sign off

## 2017-05-01 NOTE — Telephone Encounter (Signed)
I have just checked all the [employer] forms that I have and I don't have anything for this patient

## 2017-05-03 ENCOUNTER — Other Ambulatory Visit: Payer: Self-pay

## 2017-05-03 ENCOUNTER — Telehealth: Payer: Self-pay | Admitting: Neurology

## 2017-05-03 DIAGNOSIS — F419 Anxiety disorder, unspecified: Secondary | ICD-10-CM

## 2017-05-03 DIAGNOSIS — F3289 Other specified depressive episodes: Secondary | ICD-10-CM

## 2017-05-03 MED ORDER — MIRTAZAPINE 15 MG PO TABS: ORAL_TABLET | ORAL | 3 refills | Status: DC

## 2017-05-03 NOTE — Telephone Encounter (Signed)
Son called and states that they tried the half pill and it did not work so they went to the whole pill like Dr Delice Lesch said they could. It is working much better but they will need a new rx called into the [facility] for the Mirtazapine patient son states they will be out of medication today please call when you have called in the RX to let him know

## 2017-05-03 NOTE — Telephone Encounter (Signed)
Spoke with son.  He states that pt is doing great with 1 tab qhs.  Sent new Rx to primary pharmacy with updated dosage.  90 day supply with 3 refills.

## 2017-05-25 ENCOUNTER — Ambulatory Visit: Payer: TRICARE For Life (TFL) | Admitting: Neurology

## 2017-05-29 ENCOUNTER — Encounter: Payer: Self-pay | Admitting: Nurse Practitioner

## 2017-06-01 ENCOUNTER — Encounter: Payer: Self-pay | Admitting: Nurse Practitioner

## 2017-06-01 NOTE — Progress Notes (Signed)
Abstracted result and sent to scan  

## 2017-06-07 ENCOUNTER — Other Ambulatory Visit: Payer: Self-pay | Admitting: Nurse Practitioner

## 2017-06-07 DIAGNOSIS — J449 Chronic obstructive pulmonary disease, unspecified: Secondary | ICD-10-CM

## 2017-06-08 ENCOUNTER — Telehealth: Payer: Self-pay

## 2017-06-08 ENCOUNTER — Telehealth: Payer: Self-pay | Admitting: Pulmonary Disease

## 2017-06-08 DIAGNOSIS — J449 Chronic obstructive pulmonary disease, unspecified: Secondary | ICD-10-CM

## 2017-06-08 NOTE — Telephone Encounter (Signed)
Pt needs refill today, they are leaving out of town in the morning.  They want more then 30 day at a time and they need a paper script for this because they have to take it to [place] to get it filled   [phone] best number

## 2017-06-08 NOTE — Telephone Encounter (Signed)
Received below message from pt's PCP office in regards Trelegy   During OV with PM on 03/28/17, pt was given printed Rx for Trelegy with 11 refills to take to the [place] [facility] [place] with [name], NP's office pt has misplaced printed Rx. Lm for pt's son, [name] to return our call.     Medication Refill    Noitamyae, Phetcharat, LPN routed conversation to You 4 hours ago (12:02 PM)    Noitamyae, Phetcharat, LPN 4 hours ago (41:63 AM)      Spoke with [name], he stated he lost the written rx for this med when Dr. Vaughan Browner gave to him in 03/2017. [facility] and some [facility] ( written rx is for when he goes up to [facility] he can get this med for her for free). Pt need to contact Dr. Vaughan Browner for this--per [name]. Please advise.       Documentation      Stovall, Edwena Blow A routed conversation to [employer], LPN 5 hours ago (84:53 AM)    Percell Belt A 5 hours ago (10:46 AM)      Pt needs refill today, they are leaving out of town in the morning.  They want more then 30 day at a time and they need a paper script for this because they have to take it to [place] to get it filled   [phone] best number       Documentation     McFadden,Jerome   Stovall, Shana A 5 hours ago (10:46 AM)    Engineer, structural, Surescripts Out routed conversation to [employer]

## 2017-06-08 NOTE — Telephone Encounter (Signed)
lmtcb x1 for pt. Need to clarify which [facility] she would like this to go to.

## 2017-06-08 NOTE — Telephone Encounter (Signed)
Spoke with [name], he stated he lost the written rx for this med when Dr. Vaughan Browner gave to him in 03/2017. [facility] and [facility] ( written rx is for when he goes up to [facility] he can get this med for her for free). Pt need to contact Dr. Vaughan Browner for this--per [name]. Please advise.

## 2017-06-09 ENCOUNTER — Telehealth: Payer: Self-pay | Admitting: *Deleted

## 2017-06-09 MED ORDER — FLUTICASONE-UMECLIDIN-VILANT 100-62.5-25 MCG/INH IN AEPB: 1.0000 | INHALATION_SPRAY | Freq: Every day | RESPIRATORY_TRACT | 11 refills | Status: DC

## 2017-06-09 MED ORDER — FLUTICASONE-UMECLIDIN-VILANT 100-62.5-25 MCG/INH IN AEPB: 1.0000 | INHALATION_SPRAY | Freq: Every day | RESPIRATORY_TRACT | 3 refills | Status: DC

## 2017-06-09 NOTE — Telephone Encounter (Signed)
Patient son [name] in lobby.  Requesting that we send a Rx for the Trelegy to the patients local pharmacy [facility].  This has been sent. Nothing further needed.

## 2017-06-09 NOTE — Telephone Encounter (Signed)
Patient son came in office and had questions about the medication Trelegy . The son stated that the medication needed to be prior authorization and that the pharmacy would not fill the medication. .  Notified and discussed this over with [place]. Patient son states that he spoke to [facility] NP nurse about this medication. See previous messages. Thank you

## 2017-06-09 NOTE — Telephone Encounter (Signed)
1 sample of trelegy given to help until a PA can be completed.

## 2017-06-09 NOTE — Telephone Encounter (Signed)
I have called and lmom for pt to call back to verify her pharmacy.

## 2017-06-09 NOTE — Telephone Encounter (Signed)
Pt son Samantha Zamora in lobby, is requesting to get a printed Rx for Trelegy -- the patient misplaced the printed Rx given at last OV. Pt son is going to take the Rx to [facility] to see if he can get this cheaper than the local pharmacy. This has been printed and Dr Melvyn Novas signed in Dr Matilde Bash absence. Nothing further needed.

## 2017-06-09 NOTE — Telephone Encounter (Signed)
Son is waiting in lobby, requesting to speak to nurse..ert

## 2017-06-29 ENCOUNTER — Ambulatory Visit: Payer: TRICARE For Life (TFL) | Admitting: Pulmonary Disease

## 2017-08-08 ENCOUNTER — Ambulatory Visit: Payer: TRICARE For Life (TFL) | Admitting: Neurology

## 2017-08-21 ENCOUNTER — Telehealth: Payer: Self-pay | Admitting: Pulmonary Disease

## 2017-08-21 NOTE — Telephone Encounter (Signed)
Left message to call back. Sample of Trelegy is up front ready to pick up/

## 2017-08-22 NOTE — Telephone Encounter (Signed)
lmomtcb x 2  

## 2017-08-22 NOTE — Telephone Encounter (Signed)
Will close encounter as nothing further is needed.  

## 2017-08-22 NOTE — Telephone Encounter (Signed)
Pt called back and told of the sample of Trelegy left for him.  08/22/17-tr

## 2017-08-30 DIAGNOSIS — S2241XA Multiple fractures of ribs, right side, initial encounter for closed fracture: Secondary | ICD-10-CM | POA: Diagnosis not present

## 2017-08-30 DIAGNOSIS — J439 Emphysema, unspecified: Secondary | ICD-10-CM | POA: Diagnosis not present

## 2017-08-30 DIAGNOSIS — I10 Essential (primary) hypertension: Secondary | ICD-10-CM | POA: Diagnosis not present

## 2017-08-30 DIAGNOSIS — R0602 Shortness of breath: Secondary | ICD-10-CM | POA: Diagnosis not present

## 2017-08-30 DIAGNOSIS — R079 Chest pain, unspecified: Secondary | ICD-10-CM | POA: Diagnosis not present

## 2017-08-30 DIAGNOSIS — K5900 Constipation, unspecified: Secondary | ICD-10-CM | POA: Diagnosis not present

## 2017-08-30 DIAGNOSIS — Z9981 Dependence on supplemental oxygen: Secondary | ICD-10-CM | POA: Diagnosis not present

## 2017-08-30 DIAGNOSIS — F0390 Unspecified dementia without behavioral disturbance: Secondary | ICD-10-CM | POA: Diagnosis not present

## 2017-08-30 DIAGNOSIS — R5383 Other fatigue: Secondary | ICD-10-CM | POA: Diagnosis not present

## 2017-08-30 DIAGNOSIS — Z87891 Personal history of nicotine dependence: Secondary | ICD-10-CM | POA: Diagnosis not present

## 2017-08-30 DIAGNOSIS — N281 Cyst of kidney, acquired: Secondary | ICD-10-CM | POA: Diagnosis not present

## 2017-08-30 DIAGNOSIS — Z888 Allergy status to other drugs, medicaments and biological substances status: Secondary | ICD-10-CM | POA: Diagnosis not present

## 2017-08-30 DIAGNOSIS — Z7951 Long term (current) use of inhaled steroids: Secondary | ICD-10-CM | POA: Diagnosis not present

## 2017-08-30 DIAGNOSIS — Z7982 Long term (current) use of aspirin: Secondary | ICD-10-CM | POA: Diagnosis not present

## 2017-08-30 DIAGNOSIS — J449 Chronic obstructive pulmonary disease, unspecified: Secondary | ICD-10-CM | POA: Diagnosis not present

## 2017-08-30 DIAGNOSIS — Z79899 Other long term (current) drug therapy: Secondary | ICD-10-CM | POA: Diagnosis not present

## 2017-08-30 DIAGNOSIS — R918 Other nonspecific abnormal finding of lung field: Secondary | ICD-10-CM | POA: Diagnosis not present

## 2017-08-30 DIAGNOSIS — J90 Pleural effusion, not elsewhere classified: Secondary | ICD-10-CM | POA: Diagnosis not present

## 2017-08-30 DIAGNOSIS — N200 Calculus of kidney: Secondary | ICD-10-CM | POA: Diagnosis not present

## 2017-08-30 DIAGNOSIS — Z23 Encounter for immunization: Secondary | ICD-10-CM | POA: Diagnosis not present

## 2017-08-30 DIAGNOSIS — R319 Hematuria, unspecified: Secondary | ICD-10-CM | POA: Diagnosis not present

## 2017-08-30 DIAGNOSIS — R0789 Other chest pain: Secondary | ICD-10-CM | POA: Diagnosis not present

## 2017-08-31 DIAGNOSIS — S2241XA Multiple fractures of ribs, right side, initial encounter for closed fracture: Secondary | ICD-10-CM | POA: Diagnosis not present

## 2017-08-31 DIAGNOSIS — K5900 Constipation, unspecified: Secondary | ICD-10-CM | POA: Diagnosis not present

## 2017-08-31 DIAGNOSIS — F0390 Unspecified dementia without behavioral disturbance: Secondary | ICD-10-CM | POA: Diagnosis not present

## 2017-08-31 DIAGNOSIS — J449 Chronic obstructive pulmonary disease, unspecified: Secondary | ICD-10-CM | POA: Diagnosis not present

## 2017-08-31 DIAGNOSIS — I10 Essential (primary) hypertension: Secondary | ICD-10-CM | POA: Diagnosis not present

## 2017-09-01 DIAGNOSIS — Z515 Encounter for palliative care: Secondary | ICD-10-CM | POA: Diagnosis not present

## 2017-09-01 DIAGNOSIS — Z7189 Other specified counseling: Secondary | ICD-10-CM | POA: Diagnosis not present

## 2017-09-01 DIAGNOSIS — W19XXXA Unspecified fall, initial encounter: Secondary | ICD-10-CM | POA: Diagnosis not present

## 2017-09-01 DIAGNOSIS — J449 Chronic obstructive pulmonary disease, unspecified: Secondary | ICD-10-CM | POA: Diagnosis not present

## 2017-09-01 DIAGNOSIS — Z87891 Personal history of nicotine dependence: Secondary | ICD-10-CM | POA: Diagnosis not present

## 2017-09-01 DIAGNOSIS — F0390 Unspecified dementia without behavioral disturbance: Secondary | ICD-10-CM | POA: Diagnosis not present

## 2017-09-01 DIAGNOSIS — I10 Essential (primary) hypertension: Secondary | ICD-10-CM | POA: Diagnosis not present

## 2017-09-01 DIAGNOSIS — Z7982 Long term (current) use of aspirin: Secondary | ICD-10-CM | POA: Diagnosis not present

## 2017-09-01 DIAGNOSIS — R0602 Shortness of breath: Secondary | ICD-10-CM | POA: Diagnosis not present

## 2017-09-01 DIAGNOSIS — K5900 Constipation, unspecified: Secondary | ICD-10-CM | POA: Diagnosis not present

## 2017-09-01 DIAGNOSIS — Z7951 Long term (current) use of inhaled steroids: Secondary | ICD-10-CM | POA: Diagnosis not present

## 2017-09-01 DIAGNOSIS — Z79899 Other long term (current) drug therapy: Secondary | ICD-10-CM | POA: Diagnosis not present

## 2017-09-01 DIAGNOSIS — Z23 Encounter for immunization: Secondary | ICD-10-CM | POA: Diagnosis not present

## 2017-09-01 DIAGNOSIS — Z888 Allergy status to other drugs, medicaments and biological substances status: Secondary | ICD-10-CM | POA: Diagnosis not present

## 2017-09-01 DIAGNOSIS — Z9981 Dependence on supplemental oxygen: Secondary | ICD-10-CM | POA: Diagnosis not present

## 2017-09-01 DIAGNOSIS — S2241XA Multiple fractures of ribs, right side, initial encounter for closed fracture: Secondary | ICD-10-CM | POA: Diagnosis not present

## 2017-09-03 DIAGNOSIS — M47815 Spondylosis without myelopathy or radiculopathy, thoracolumbar region: Secondary | ICD-10-CM | POA: Diagnosis not present

## 2017-09-03 DIAGNOSIS — Z9981 Dependence on supplemental oxygen: Secondary | ICD-10-CM | POA: Diagnosis not present

## 2017-09-03 DIAGNOSIS — I10 Essential (primary) hypertension: Secondary | ICD-10-CM | POA: Diagnosis not present

## 2017-09-03 DIAGNOSIS — F0390 Unspecified dementia without behavioral disturbance: Secondary | ICD-10-CM | POA: Diagnosis not present

## 2017-09-03 DIAGNOSIS — S2241XD Multiple fractures of ribs, right side, subsequent encounter for fracture with routine healing: Secondary | ICD-10-CM | POA: Diagnosis not present

## 2017-09-03 DIAGNOSIS — N200 Calculus of kidney: Secondary | ICD-10-CM | POA: Diagnosis not present

## 2017-09-03 DIAGNOSIS — Z7982 Long term (current) use of aspirin: Secondary | ICD-10-CM | POA: Diagnosis not present

## 2017-09-03 DIAGNOSIS — K5900 Constipation, unspecified: Secondary | ICD-10-CM | POA: Diagnosis not present

## 2017-09-03 DIAGNOSIS — Z87891 Personal history of nicotine dependence: Secondary | ICD-10-CM | POA: Diagnosis not present

## 2017-09-03 DIAGNOSIS — J449 Chronic obstructive pulmonary disease, unspecified: Secondary | ICD-10-CM | POA: Diagnosis not present

## 2017-09-04 ENCOUNTER — Telehealth: Payer: Self-pay | Admitting: Nurse Practitioner

## 2017-09-04 NOTE — Telephone Encounter (Signed)
Notified Samantha Zamora w/Samantha Zamora response...Samantha Zamora

## 2017-09-04 NOTE — Telephone Encounter (Signed)
Requesting verbal orders for home health skill nursing for twice a week for two weeks, one time a week for three weeks, and one every other week for four weeks for disease management.  Also requesting home health aid to assist with bathing needs for twice a week for two weeks.  Requesting a blanket order for: PT, OT, Speech therapy and medical social work.

## 2017-09-04 NOTE — Telephone Encounter (Signed)
Ok to order 

## 2017-09-05 DIAGNOSIS — F0390 Unspecified dementia without behavioral disturbance: Secondary | ICD-10-CM | POA: Diagnosis not present

## 2017-09-05 DIAGNOSIS — J449 Chronic obstructive pulmonary disease, unspecified: Secondary | ICD-10-CM | POA: Diagnosis not present

## 2017-09-05 DIAGNOSIS — N200 Calculus of kidney: Secondary | ICD-10-CM | POA: Diagnosis not present

## 2017-09-05 DIAGNOSIS — K5900 Constipation, unspecified: Secondary | ICD-10-CM | POA: Diagnosis not present

## 2017-09-05 DIAGNOSIS — I10 Essential (primary) hypertension: Secondary | ICD-10-CM | POA: Diagnosis not present

## 2017-09-05 DIAGNOSIS — S2241XD Multiple fractures of ribs, right side, subsequent encounter for fracture with routine healing: Secondary | ICD-10-CM | POA: Diagnosis not present

## 2017-09-06 ENCOUNTER — Emergency Department (HOSPITAL_COMMUNITY): Payer: Medicare Other

## 2017-09-06 ENCOUNTER — Encounter (HOSPITAL_COMMUNITY): Payer: Self-pay | Admitting: *Deleted

## 2017-09-06 ENCOUNTER — Inpatient Hospital Stay (HOSPITAL_COMMUNITY)
Admission: EM | Admit: 2017-09-06 | Discharge: 2017-09-11 | DRG: 194 | Disposition: A | Discharge status: Home / Self Care | Payer: Medicare Other | Attending: Family Medicine | Admitting: Family Medicine

## 2017-09-06 ENCOUNTER — Ambulatory Visit: Payer: TRICARE For Life (TFL) | Admitting: Neurology

## 2017-09-06 DIAGNOSIS — Z87891 Personal history of nicotine dependence: Secondary | ICD-10-CM | POA: Diagnosis not present

## 2017-09-06 DIAGNOSIS — Z7951 Long term (current) use of inhaled steroids: Secondary | ICD-10-CM

## 2017-09-06 DIAGNOSIS — Z9981 Dependence on supplemental oxygen: Secondary | ICD-10-CM | POA: Diagnosis not present

## 2017-09-06 DIAGNOSIS — Y95 Nosocomial condition: Secondary | ICD-10-CM | POA: Diagnosis present

## 2017-09-06 DIAGNOSIS — S2231XA Fracture of one rib, right side, initial encounter for closed fracture: Secondary | ICD-10-CM | POA: Diagnosis not present

## 2017-09-06 DIAGNOSIS — Z7982 Long term (current) use of aspirin: Secondary | ICD-10-CM | POA: Diagnosis not present

## 2017-09-06 DIAGNOSIS — R0602 Shortness of breath: Secondary | ICD-10-CM

## 2017-09-06 DIAGNOSIS — J302 Other seasonal allergic rhinitis: Secondary | ICD-10-CM | POA: Diagnosis present

## 2017-09-06 DIAGNOSIS — F419 Anxiety disorder, unspecified: Secondary | ICD-10-CM | POA: Diagnosis present

## 2017-09-06 DIAGNOSIS — J189 Pneumonia, unspecified organism: Secondary | ICD-10-CM | POA: Diagnosis not present

## 2017-09-06 DIAGNOSIS — Z886 Allergy status to analgesic agent status: Secondary | ICD-10-CM

## 2017-09-06 DIAGNOSIS — F0390 Unspecified dementia without behavioral disturbance: Secondary | ICD-10-CM | POA: Diagnosis present

## 2017-09-06 DIAGNOSIS — J440 Chronic obstructive pulmonary disease with acute lower respiratory infection: Secondary | ICD-10-CM | POA: Diagnosis present

## 2017-09-06 DIAGNOSIS — E119 Type 2 diabetes mellitus without complications: Secondary | ICD-10-CM | POA: Diagnosis present

## 2017-09-06 DIAGNOSIS — R0902 Hypoxemia: Secondary | ICD-10-CM | POA: Diagnosis not present

## 2017-09-06 DIAGNOSIS — R410 Disorientation, unspecified: Secondary | ICD-10-CM | POA: Diagnosis not present

## 2017-09-06 DIAGNOSIS — R4182 Altered mental status, unspecified: Secondary | ICD-10-CM | POA: Diagnosis not present

## 2017-09-06 DIAGNOSIS — J310 Chronic rhinitis: Secondary | ICD-10-CM | POA: Diagnosis present

## 2017-09-06 DIAGNOSIS — J449 Chronic obstructive pulmonary disease, unspecified: Secondary | ICD-10-CM | POA: Diagnosis present

## 2017-09-06 LAB — URINALYSIS, ROUTINE W REFLEX MICROSCOPIC
Bilirubin Urine: NEGATIVE
Glucose, UA: NEGATIVE mg/dL
KETONES UR: NEGATIVE mg/dL
Nitrite: NEGATIVE
Protein, ur: 100 mg/dL — AB
Specific Gravity, Urine: 1.013 (ref 1.005–1.030)
pH: 7 (ref 5.0–8.0)

## 2017-09-06 LAB — COMPREHENSIVE METABOLIC PANEL
ALK PHOS: 64 U/L (ref 38–126)
ALT: 13 U/L — AB (ref 14–54)
AST: 22 U/L (ref 15–41)
Albumin: 3.5 g/dL (ref 3.5–5.0)
Anion gap: 8 (ref 5–15)
BUN: 17 mg/dL (ref 6–20)
CALCIUM: 9.9 mg/dL (ref 8.9–10.3)
CO2: 36 mmol/L — AB (ref 22–32)
CREATININE: 0.72 mg/dL (ref 0.44–1.00)
Chloride: 98 mmol/L — ABNORMAL LOW (ref 101–111)
GFR calc Af Amer: >60 mL/min (ref >60)
GFR calc non Af Amer: >60 mL/min (ref >60)
GLUCOSE: 113 mg/dL — AB (ref 65–99)
Potassium: 4.2 mmol/L (ref 3.5–5.1)
Sodium: 142 mmol/L (ref 135–145)
Total Bilirubin: 0.6 mg/dL (ref 0.3–1.2)
Total Protein: 6.4 g/dL — ABNORMAL LOW (ref 6.5–8.1)

## 2017-09-06 LAB — CBC
HCT: 38.4 % (ref 36.0–46.0)
Hemoglobin: 11.4 g/dL — ABNORMAL LOW (ref 12.0–15.0)
MCH: 27.9 pg (ref 26.0–34.0)
MCHC: 29.7 g/dL — AB (ref 30.0–36.0)
MCV: 94.1 fL (ref 78.0–100.0)
PLATELETS: 206 10*3/uL (ref 150–400)
RBC: 4.08 MIL/uL (ref 3.87–5.11)
RDW: 12.5 % (ref 11.5–15.5)
WBC: 5.8 10*3/uL (ref 4.0–10.5)

## 2017-09-06 LAB — CBG MONITORING, ED: Glucose-Capillary: 110 mg/dL — ABNORMAL HIGH (ref 65–99)

## 2017-09-06 MED ORDER — BUDESONIDE 0.25 MG/2ML IN SUSP
0.2500 mg | Freq: Two times a day (BID) | RESPIRATORY_TRACT | Status: DC
  Administered 2017-09-07 – 2017-09-11 (×7): 0.25 mg via RESPIRATORY_TRACT
  Filled 2017-09-06 (×9): qty 2

## 2017-09-06 MED ORDER — DEXTROSE 5 % IV SOLN
1.0000 g | Freq: Two times a day (BID) | INTRAVENOUS | Status: DC
  Administered 2017-09-06 – 2017-09-07 (×2): 1 g via INTRAVENOUS
  Filled 2017-09-06 (×3): qty 1

## 2017-09-06 MED ORDER — VANCOMYCIN HCL IN DEXTROSE 750-5 MG/150ML-% IV SOLN
750.0000 mg | INTRAVENOUS | Status: DC
  Filled 2017-09-06: qty 150

## 2017-09-06 MED ORDER — UMECLIDINIUM-VILANTEROL 62.5-25 MCG/INH IN AEPB
1.0000 | INHALATION_SPRAY | Freq: Every day | RESPIRATORY_TRACT | Status: DC
  Administered 2017-09-08 – 2017-09-11 (×4): 1 via RESPIRATORY_TRACT
  Filled 2017-09-06: qty 14

## 2017-09-06 MED ORDER — ALBUTEROL SULFATE (2.5 MG/3ML) 0.083% IN NEBU: 2.5000 mg | INHALATION_SOLUTION | Freq: Four times a day (QID) | RESPIRATORY_TRACT | Status: DC | PRN

## 2017-09-06 MED ORDER — ASPIRIN EC 81 MG PO TBEC
81.0000 mg | DELAYED_RELEASE_TABLET | Freq: Every day | ORAL | Status: DC
  Administered 2017-09-07 – 2017-09-11 (×5): 81 mg via ORAL
  Filled 2017-09-06 (×5): qty 1

## 2017-09-06 MED ORDER — PIPERACILLIN-TAZOBACTAM 3.375 G IVPB 30 MIN
3.3750 g | Freq: Once | INTRAVENOUS | Status: AC
  Administered 2017-09-06: 3.375 g via INTRAVENOUS
  Filled 2017-09-06: qty 50

## 2017-09-06 MED ORDER — SODIUM CHLORIDE 0.9% FLUSH
3.0000 mL | Freq: Two times a day (BID) | INTRAVENOUS | Status: DC
  Administered 2017-09-06 – 2017-09-11 (×4): 3 mL via INTRAVENOUS

## 2017-09-06 MED ORDER — HEPARIN SODIUM (PORCINE) 5000 UNIT/ML IJ SOLN
5000.0000 [IU] | Freq: Three times a day (TID) | INTRAMUSCULAR | Status: DC
  Administered 2017-09-06 – 2017-09-11 (×15): 5000 [IU] via SUBCUTANEOUS
  Filled 2017-09-06 (×13): qty 1

## 2017-09-06 MED ORDER — FLUTICASONE PROPIONATE 50 MCG/ACT NA SUSP
1.0000 | Freq: Every day | NASAL | Status: DC
  Administered 2017-09-06 – 2017-09-11 (×6): 1 via NASAL
  Filled 2017-09-06: qty 16

## 2017-09-06 MED ORDER — MIRTAZAPINE 15 MG PO TABS
15.0000 mg | ORAL_TABLET | Freq: Every day | ORAL | Status: DC
  Administered 2017-09-06 – 2017-09-10 (×5): 15 mg via ORAL
  Filled 2017-09-06 (×5): qty 1

## 2017-09-06 MED ORDER — SODIUM CHLORIDE 0.9% FLUSH: 3.0000 mL | INTRAVENOUS | Status: DC | PRN

## 2017-09-06 MED ORDER — SODIUM CHLORIDE 0.9 % IV SOLN: 250.0000 mL | INTRAVENOUS | Status: DC | PRN

## 2017-09-06 MED ORDER — FLUTICASONE-UMECLIDIN-VILANT 100-62.5-25 MCG/INH IN AEPB: 1.0000 | INHALATION_SPRAY | Freq: Every day | RESPIRATORY_TRACT | Status: DC

## 2017-09-06 MED ORDER — CEFEPIME HCL 1 G IJ SOLR: 1.0000 g | Freq: Three times a day (TID) | INTRAMUSCULAR | Status: DC

## 2017-09-06 MED ORDER — SODIUM CHLORIDE 0.9 % IV SOLN
INTRAVENOUS | Status: DC
  Administered 2017-09-06: 20 mL/h via INTRAVENOUS

## 2017-09-06 MED ORDER — ALBUTEROL SULFATE HFA 108 (90 BASE) MCG/ACT IN AERS: 1.0000 | INHALATION_SPRAY | Freq: Four times a day (QID) | RESPIRATORY_TRACT | Status: DC | PRN

## 2017-09-06 MED ORDER — VANCOMYCIN HCL IN DEXTROSE 1-5 GM/200ML-% IV SOLN
1000.0000 mg | Freq: Once | INTRAVENOUS | Status: AC
  Administered 2017-09-06: 1000 mg via INTRAVENOUS
  Filled 2017-09-06: qty 200

## 2017-09-06 NOTE — H&P (Signed)
History and Physical    Samantha Zamora GUY:[id number] DOB: 08/28/1924 DOA: 09/06/2017  PCP: Flossie Buffy, NP  Patient coming from: home  Chief Complaint: Confusion above baseline  HPI: Samantha Zamora is a 83 y.o. female with medical history significant of dementia, anxiety, COPD, and seasonal allergies. Presenting after family noticed patient to be more confused above baseline. Reportedly patient fell within the last few weeks and landed on her right side back training several ribs. Since then family reports that she has not been at her baseline. Patient is not able to provide history as she is confused. Much of the history is obtained by family at bedside and ER physician. When asked directly patient has no particular complaints  ED Course: Upon further evaluation patient was found to have pneumonia. We were consulted for further medical evaluation recommendations  Review of Systems: Unable to assess accurately secondary to dementia  Past Medical History:  Diagnosis Date  . Allergic rhinitis   . COPD (chronic obstructive pulmonary disease) ([place])    O2 dependent  . Dementia    early stage  . DM (diabetes mellitus) ([place])   . Eczema   . GERD (gastroesophageal reflux disease)   . HTN (hypertension), benign   . Hx of colon cancer, stage I   . Osteoporosis     Past Surgical History:  Procedure Laterality Date  . ABDOMINAL HYSTERECTOMY  1975  . COLON SURGERY  01/2010  . UMBILICAL HERNIA REPAIR  01/2010     reports that she has quit smoking. She has a 60.00 pack-year smoking history. She has never used smokeless tobacco. She reports that she does not drink alcohol or use drugs.  Allergies  Allergen Reactions  . Ibuprofen     (motrin)  Other reaction(s): Bleeding    Family History  Problem Relation Age of Onset  . Family history unknown: Yes    Prior to Admission medications   Medication Sig Start Date End Date Taking? Authorizing Provider  Acetaminophen  (TYLENOL) 325 MG CAPS Take 325 mg by mouth as needed.    [provider]  albuterol (PROVENTIL HFA;VENTOLIN HFA) 108 (90 Base) MCG/ACT inhaler Inhale into the lungs every 6 (six) hours as needed for wheezing or shortness of breath.    [provider]  aspirin EC 81 MG tablet Take 81 mg by mouth daily.    [provider]  fluticasone (FLONASE) 50 MCG/ACT nasal spray Place 1 spray into the nose. 50 mcg 06/10/16 06/10/17  [provider]  Fluticasone-Umeclidin-Vilant (TRELEGY ELLIPTA) 100-62.5-25 MCG/INH AEPB Inhale 1 Inhaler into the lungs daily. 06/09/17   Tanda Rockers, MD  Fluticasone-Umeclidin-Vilant (TRELEGY ELLIPTA) 100-62.5-25 MCG/INH AEPB Inhale 1 Inhaler into the lungs daily. 06/09/17   Mannam, Hart Robinsons, MD  Fluticasone-Umeclidin-Vilant (TRELEGY ELLIPTA) 100-62.5-25 MCG/INH AEPB Inhale 1 Inhaler into the lungs daily. 06/09/17   Mannam, Hart Robinsons, MD  Loratadine (CLARITIN PO) Take by mouth.    [provider]  mirtazapine (REMERON) 15 MG tablet Take 1 tablet at night 05/03/17   Cameron Sprang, MD    Physical Exam: Vitals:   09/06/17 1355 09/06/17 1403 09/06/17 1507 09/06/17 1748  BP:  (!) 148/63 132/68 (!) 155/67  Pulse:  74 72 83  Resp:   14 20  Temp:  98.4 F (36.9 C)    SpO2: 100% 100% 100% 98%    Constitutional: NAD, calm, Awake and alert Vitals:   09/06/17 1355 09/06/17 1403 09/06/17 1507 09/06/17 1748  BP:  (!) 148/63 132/68 Marland Kitchen)  155/67  Pulse:  74 72 83  Resp:   14 20  Temp:  98.4 F (36.9 C)    SpO2: 100% 100% 100% 98%   Eyes: PERRL, lids and conjunctivae normal ENMT: Mucous membranes are moist. Posterior pharynx clear of any exudate or lesions. Neck: normal, supple, no masses, no thyromegaly Respiratory: Rhales over right lung base, no wheezing, no crackles. Normal respiratory effort. No accessory muscle use.  Cardiovascular: Regular rate and rhythm, no murmurs / rubs / gallops.  Abdomen: no tenderness, no masses palpated. No  hepatosplenomegaly. Bowel sounds positive.  Musculoskeletal: no clubbing / cyanosis. No joint deformity upper and lower extremities.  Skin: no rashes, lesions, ulcers. No induration, on limited exam. Neurologic: no facial asymmetry, moves all extremities equally, sensation to light touch intact Psychiatric: Unable to assess appropriately as patient is confused    Labs on Admission: I have personally reviewed following labs and imaging studies  CBC:  Recent Labs Lab 09/06/17 1409  WBC 5.8  HGB 11.4*  HCT 38.4  MCV 94.1  PLT 867   Basic Metabolic Panel:  Recent Labs Lab 09/06/17 1409  NA 142  K 4.2  CL 98*  CO2 36*  GLUCOSE 113*  BUN 17  CREATININE 0.72  CALCIUM 9.9   GFR: CrCl cannot be calculated (Unknown ideal weight.). Liver Function Tests:  Recent Labs Lab 09/06/17 1409  AST 22  ALT 13*  ALKPHOS 64  BILITOT 0.6  PROT 6.4*  ALBUMIN 3.5   No results for input(s): LIPASE, AMYLASE in the last 168 hours. No results for input(s): AMMONIA in the last 168 hours. Coagulation Profile: No results for input(s): INR, PROTIME in the last 168 hours. Cardiac Enzymes: No results for input(s): CKTOTAL, CKMB, CKMBINDEX, TROPONINI in the last 168 hours. BNP (last 3 results) No results for input(s): PROBNP in the last 8760 hours. HbA1C: No results for input(s): HGBA1C in the last 72 hours. CBG:  Recent Labs Lab 09/06/17 1511  GLUCAP 110*   Lipid Profile: No results for input(s): CHOL, HDL, LDLCALC, TRIG, CHOLHDL, LDLDIRECT in the last 72 hours. Thyroid Function Tests: No results for input(s): TSH, T4TOTAL, FREET4, T3FREE, THYROIDAB in the last 72 hours. Anemia Panel: No results for input(s): VITAMINB12, FOLATE, FERRITIN, TIBC, IRON, RETICCTPCT in the last 72 hours. Urine analysis:    Component Value Date/Time   COLORURINE YELLOW 09/06/2017 1604   APPEARANCEUR HAZY (A) 09/06/2017 1604   LABSPEC 1.013 09/06/2017 1604   PHURINE 7.0 09/06/2017 1604   GLUCOSEU  NEGATIVE 09/06/2017 1604   HGBUR MODERATE (A) 09/06/2017 [place] 09/06/2017 [place] 09/06/2017 1604   PROTEINUR 100 (A) 09/06/2017 1604   NITRITE NEGATIVE 09/06/2017 1604   LEUKOCYTESUR SMALL (A) 09/06/2017 1604    Radiological Exams on Admission: Dg Chest 2 View  Result Date: 09/06/2017 CLINICAL DATA:  Fall 1 week ago with rib fractures. Shortness of breath. EXAM: CHEST  2 VIEW COMPARISON:  Two-view chest x-ray 03/20/2017. FINDINGS: Heart is mildly enlarged. Aortic atherosclerosis is present. There is no significant edema. The left lung is clear. The lungs remain hyperinflated bilaterally. Right eighth and ninth rib fractures are evident laterally. There is no pneumothorax. Posterior right lower lobe airspace disease is present. The visualized soft tissues and bony thorax are otherwise unremarkable. IMPRESSION: 1. Right-sided rib fractures without pneumothorax. 2. Right posterior lower lobe airspace opacity. The differential diagnosis includes traumatic effusion with associated atelectasis. Lung contusion or infection are also considered. 3. Borderline cardiomegaly without  failure. 4. Stable emphysema. Electronically Signed   By: San Morelle M.D.   On: 09/06/2017 16:51   Ct Head Wo Contrast  Result Date: 09/06/2017 CLINICAL DATA:  Altered mental status for 5 days. EXAM: CT HEAD WITHOUT CONTRAST TECHNIQUE: Contiguous axial images were obtained from the base of the skull through the vertex without intravenous contrast. COMPARISON:  None. FINDINGS: Brain: Stable age related cerebral atrophy, ventriculomegaly and periventricular white matter disease. No extra-axial fluid collections are identified. No CT findings for acute hemispheric infarction or intracranial hemorrhage. No mass lesions. The brainstem and cerebellum are normal. Vascular: No hyperdense vessels or obvious aneurysm. Moderate scattered atherosclerotic calcifications. Skull: No acute  fracture or bone lesion. Sinuses/Orbits: The paranasal sinuses and mastoid air cells are clear. The globes are intact. Other: No scalp lesions or hematoma. IMPRESSION: Age related cerebral atrophy, ventriculomegaly and periventricular white matter disease. No definite acute infarction and no intracranial hemorrhage or mass. Electronically Signed   By: Marijo Sanes M.D.   On: 09/06/2017 16:40    EKG: Independently reviewed. Sinus rhythm with no ST elevations or depressions  Assessment/Plan Active Problems:   HCAP (healthcare-associated pneumonia) - Pt on Vancomycin and Cefepime per my new orders - continue pneumonia order set - monitor wbc levels    COPD (chronic obstructive pulmonary disease) (HCC) - No wheezes or signs consistent with exacerbation. Continue prior to admission medication regimen.  - continue albuterol prn    Chronic rhinitis - Stable on flonase    Anxiety - stable on prior to admission medication regimen. Will continue TCA at bedtime.  DVT prophylaxis: heparin Code Status: Full Family Communication:  Disposition Plan: Consults called:  Admission status: inpatient   Velvet Bathe MD [facility] Pager [phone]  If 7PM-7AM, please contact night-coverage [email or web address] Password [email or web address]  09/06/2017, 6:15 PM

## 2017-09-06 NOTE — ED Notes (Signed)
Vancomycin Did not infuse properly. Restarted at 2026. Lines checked and infusing.

## 2017-09-06 NOTE — ED Provider Notes (Signed)
[facility] DEPT Provider Note   CSN: [id number] Arrival date & time: 09/06/17  1301     History   Chief Complaint Chief Complaint  Patient presents with  . Altered Mental Status    HPI Samantha Zamora is a 83 y.o. female.  83 year old female presents with several days of generalized weakness as well as altered mental status. Has had foul-smelling urine and concern for possible urinary tract infection. Had a fall several days ago and was reportedly seen outside facility for that. She was admitted for several rib fractures. Since being home she has not been as active. No fever, vomiting, diarrhea. No cough or congestion. No dyspnea. Family states that although she has a history of dementia she is currently at her baseline.      Past Medical History:  Diagnosis Date  . Allergic rhinitis   . COPD (chronic obstructive pulmonary disease) ([place])    O2 dependent  . Dementia    early stage  . DM (diabetes mellitus) ([place])   . Eczema   . GERD (gastroesophageal reflux disease)   . HTN (hypertension), benign   . Hx of colon cancer, stage I   . Osteoporosis     Patient Active Problem List   Diagnosis Date Noted  . Mild dementia 03/31/2017  . Anxiety 03/31/2017  . Depression 03/31/2017  . Chronic rhinitis 03/24/2017  . Transient alteration of awareness 03/24/2017  . 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 COPD (chronic obstructive pulmonary disease) ([place]) 03/20/2017    Past Surgical History:  Procedure Laterality Date  . ABDOMINAL HYSTERECTOMY  1975  . COLON SURGERY  01/2010  . UMBILICAL HERNIA REPAIR  01/2010    OB History    No data available       Home Medications    Prior to Admission medications   Medication Sig Start Date End Date Taking? Authorizing Provider  Acetaminophen (TYLENOL) 325 MG CAPS Take 325 mg by mouth as needed.    [provider]  albuterol (PROVENTIL HFA;VENTOLIN HFA) 108 (90 Base) MCG/ACT inhaler Inhale into the lungs every 6 (six) hours  as needed for wheezing or shortness of breath.    [provider]  aspirin EC 81 MG tablet Take 81 mg by mouth daily.    [provider]  fluticasone (FLONASE) 50 MCG/ACT nasal spray Place 1 spray into the nose. 50 mcg 06/10/16 06/10/17  [provider]  Fluticasone-Umeclidin-Vilant (TRELEGY ELLIPTA) 100-62.5-25 MCG/INH AEPB Inhale 1 Inhaler into the lungs daily. 06/09/17   Tanda Rockers, MD  Fluticasone-Umeclidin-Vilant (TRELEGY ELLIPTA) 100-62.5-25 MCG/INH AEPB Inhale 1 Inhaler into the lungs daily. 06/09/17   Mannam, Hart Robinsons, MD  Fluticasone-Umeclidin-Vilant (TRELEGY ELLIPTA) 100-62.5-25 MCG/INH AEPB Inhale 1 Inhaler into the lungs daily. 06/09/17   Mannam, Hart Robinsons, MD  Loratadine (CLARITIN PO) Take by mouth.    [provider]  mirtazapine (REMERON) 15 MG tablet Take 1 tablet at night 05/03/17   Cameron Sprang, MD    Family History Family History  Problem Relation Age of Onset  . Family history unknown: Yes    Social History Social History  Substance Use Topics  . Smoking status: Former Smoker    Packs/day: 2.00    Years: 30.00  . Smokeless tobacco: Never Used     Comment: quit smoking over 50 years ago  . Alcohol use No     Allergies   Ibuprofen   Review of Systems Review of Systems  All other systems reviewed and are negative.    Physical Exam  Updated Vital Signs BP (!) 148/63   Pulse 74   Temp 98.4 F (36.9 C)   SpO2 100%   Physical Exam  Constitutional: She is oriented to person, place, and time. She appears well-developed and well-nourished.  Non-toxic appearance. No distress.  HENT:  Head: Normocephalic and atraumatic.  Eyes: Pupils are equal, round, and reactive to light. Conjunctivae, EOM and lids are normal.  Neck: Normal range of motion. Neck supple. No tracheal deviation present. No thyroid mass present.  Cardiovascular: Normal rate, regular rhythm and normal heart sounds.  Exam reveals no gallop.   No murmur  heard. Pulmonary/Chest: Effort normal and breath sounds normal. No stridor. No respiratory distress. She has no decreased breath sounds. She has no wheezes. She has no rhonchi. She has no rales. She exhibits no crepitus.  Abdominal: Soft. Normal appearance and bowel sounds are normal. She exhibits no distension. There is no tenderness. There is no rebound and no CVA tenderness.  Musculoskeletal: Normal range of motion. She exhibits no edema or tenderness.  Neurological: She is alert and oriented to person, place, and time. She has normal strength. No cranial nerve deficit or sensory deficit. GCS eye subscore is 4. GCS verbal subscore is 5. GCS motor subscore is 6.  Skin: Skin is warm and dry. No abrasion and no rash noted.  Psychiatric: Her affect is blunt. Her speech is delayed. She is slowed.  Nursing note and vitals reviewed.    ED Treatments / Results  Labs (all labs ordered are listed, but only abnormal results are displayed) Labs Reviewed  CBG MONITORING, ED - Abnormal; Notable for the following:       Result Value   Glucose-Capillary 110 (*)    All other components within normal limits  COMPREHENSIVE METABOLIC PANEL  CBC  URINALYSIS, ROUTINE W REFLEX MICROSCOPIC    EKG  EKG Interpretation None       Radiology No results found.  Procedures Procedures (including critical care time)  Medications Ordered in ED Medications  0.9 %  sodium chloride infusion (not administered)     Initial Impression / Assessment and Plan / ED Course  I have reviewed the triage vital signs and the nursing notes.  Pertinent labs & imaging results that were available during my care of the patient were reviewed by me and considered in my medical decision making (see chart for details).   patient's chest x-ray concerning for pneumonia. Patient has been more weak and slightly short of breath. Was started on IV antibiotics and admitted to the hospitalist service  Final Clinical  Impressions(s) / ED Diagnoses   Final diagnoses:  SOB (shortness of breath)    New Prescriptions New Prescriptions   No medications on file     Lacretia Leigh, MD 09/06/17 1711

## 2017-09-06 NOTE — ED Triage Notes (Addendum)
Per EMS, Pt here from [facility] with AMS x 5 days. Urine has been dark and foul smelling. Reports fever at urgent care of 99.1. Afebrile here. Reports fall a week ago to right side with 4 broken ribs. No anticoagulants. Hx of dementia. Family reports that pt hasn't eaten or drank anything since Friday because she thinks everyone is poisoning her.

## 2017-09-06 NOTE — ED Notes (Signed)
ED TO INPATIENT HANDOFF REPORT  Name/Age/Gender Samantha Zamora 83 y.o. female  Code Status Code Status History    This patient does not have a recorded code status. Please follow your organizational policy for patients in this situation.    Advance Directive Documentation     Most Recent Value  Type of Advance Directive  Healthcare Power of Attorney, Living will  Pre-existing out of facility DNR order (yellow form or pink MOST form)  -  "MOST" Form in Place?  -      Home/SNF/Other Home  Chief Complaint altered mental status  Level of Care/Admitting Diagnosis ED Disposition    ED Disposition Condition [place]: [facility] [100102]  Level of Care: Med-Surg [16]  Diagnosis: HCAP (healthcare-associated pneumonia) [161096]  Admitting Physician: Velvet Bathe [[id number]]  Attending Physician: Velvet Bathe [phone]  Estimated length of stay: past midnight tomorrow  Certification:: I certify this patient will need inpatient services for at least 2 midnights  PT Class (Do Not Modify): Inpatient [101]  PT Acc Code (Do Not Modify): Private [1]       Medical History Past Medical History:  Diagnosis Date  . Allergic rhinitis   . COPD (chronic obstructive pulmonary disease) ([place])    O2 dependent  . Dementia    early stage  . DM (diabetes mellitus) ([place])   . Eczema   . GERD (gastroesophageal reflux disease)   . HTN (hypertension), benign   . Hx of colon cancer, stage I   . Osteoporosis     Allergies Allergies  Allergen Reactions  . Ibuprofen     (motrin)  Other reaction(s): Bleeding    IV Location/Drains/Wounds Patient Lines/Drains/Airways Status   Active Line/Drains/Airways    Name:   Placement date:   Placement time:   Site:   Days:   Peripheral IV 09/06/17 Left Forearm  09/06/17    1509    Forearm    less than 1          Labs/Imaging Results for orders placed or performed during the hospital encounter of 09/06/17  (from the past 48 hour(s))  Comprehensive metabolic panel     Status: Abnormal   Collection Time: 09/06/17  2:09 PM  Result Value Ref Range   Sodium 142 135 - 145 mmol/L   Potassium 4.2 3.5 - 5.1 mmol/L   Chloride 98 (L) 101 - 111 mmol/L   CO2 36 (H) 22 - 32 mmol/L   Glucose, Bld 113 (H) 65 - 99 mg/dL   BUN 17 6 - 20 mg/dL   Creatinine, Ser 0.72 0.44 - 1.00 mg/dL   Calcium 9.9 8.9 - 10.3 mg/dL   Total Protein 6.4 (L) 6.5 - 8.1 g/dL   Albumin 3.5 3.5 - 5.0 g/dL   AST 22 15 - 41 U/L   ALT 13 (L) 14 - 54 U/L   Alkaline Phosphatase 64 38 - 126 U/L   Total Bilirubin 0.6 0.3 - 1.2 mg/dL   GFR calc non Af Amer >60 >60 mL/min   GFR calc Af Amer >60 >60 mL/min    Comment: (NOTE) The eGFR has been calculated using the CKD EPI equation. This calculation has not been validated in all clinical situations. eGFR's persistently <60 mL/min signify possible Chronic Kidney Disease.    Anion gap 8 5 - 15  CBC     Status: Abnormal   Collection Time: 09/06/17  2:09 PM  Result Value Ref Range   WBC 5.8 4.0 -  10.5 K/uL   RBC 4.08 3.87 - 5.11 MIL/uL   Hemoglobin 11.4 (L) 12.0 - 15.0 g/dL   HCT 38.4 36.0 - 46.0 %   MCV 94.1 78.0 - 100.0 fL   MCH 27.9 26.0 - 34.0 pg   MCHC 29.7 (L) 30.0 - 36.0 g/dL   RDW 12.5 11.5 - 15.5 %   Platelets 206 150 - 400 K/uL  CBG monitoring, ED     Status: Abnormal   Collection Time: 09/06/17  3:11 PM  Result Value Ref Range   Glucose-Capillary 110 (H) 65 - 99 mg/dL  Urinalysis, Routine w reflex microscopic     Status: Abnormal   Collection Time: 09/06/17  4:04 PM  Result Value Ref Range   Color, Urine YELLOW YELLOW   APPearance HAZY (A) CLEAR   Specific Gravity, Urine 1.013 1.005 - 1.030   pH 7.0 5.0 - 8.0   Glucose, UA NEGATIVE NEGATIVE mg/dL   Hgb urine dipstick MODERATE (A) NEGATIVE   Bilirubin Urine NEGATIVE NEGATIVE   Ketones, ur NEGATIVE NEGATIVE mg/dL   Protein, ur 100 (A) NEGATIVE mg/dL   Nitrite NEGATIVE NEGATIVE   Leukocytes, UA SMALL (A) NEGATIVE    RBC / HPF TOO NUMEROUS TO COUNT 0 - 5 RBC/hpf   WBC, UA 6-30 0 - 5 WBC/hpf   Bacteria, UA MANY (A) NONE SEEN   Squamous Epithelial / LPF 0-5 (A) NONE SEEN   Dg Chest 2 View  Result Date: 09/06/2017 CLINICAL DATA:  Fall 1 week ago with rib fractures. Shortness of breath. EXAM: CHEST  2 VIEW COMPARISON:  Two-view chest x-ray 03/20/2017. FINDINGS: Heart is mildly enlarged. Aortic atherosclerosis is present. There is no significant edema. The left lung is clear. The lungs remain hyperinflated bilaterally. Right eighth and ninth rib fractures are evident laterally. There is no pneumothorax. Posterior right lower lobe airspace disease is present. The visualized soft tissues and bony thorax are otherwise unremarkable. IMPRESSION: 1. Right-sided rib fractures without pneumothorax. 2. Right posterior lower lobe airspace opacity. The differential diagnosis includes traumatic effusion with associated atelectasis. Lung contusion or infection are also considered. 3. Borderline cardiomegaly without failure. 4. Stable emphysema. Electronically Signed   By: San Morelle M.D.   On: 09/06/2017 16:51   Ct Head Wo Contrast  Result Date: 09/06/2017 CLINICAL DATA:  Altered mental status for 5 days. EXAM: CT HEAD WITHOUT CONTRAST TECHNIQUE: Contiguous axial images were obtained from the base of the skull through the vertex without intravenous contrast. COMPARISON:  None. FINDINGS: Brain: Stable age related cerebral atrophy, ventriculomegaly and periventricular white matter disease. No extra-axial fluid collections are identified. No CT findings for acute hemispheric infarction or intracranial hemorrhage. No mass lesions. The brainstem and cerebellum are normal. Vascular: No hyperdense vessels or obvious aneurysm. Moderate scattered atherosclerotic calcifications. Skull: No acute fracture or bone lesion. Sinuses/Orbits: The paranasal sinuses and mastoid air cells are clear. The globes are intact. Other: No scalp  lesions or hematoma. IMPRESSION: Age related cerebral atrophy, ventriculomegaly and periventricular white matter disease. No definite acute infarction and no intracranial hemorrhage or mass. Electronically Signed   By: Marijo Sanes M.D.   On: 09/06/2017 16:40    Pending Labs [employer]     Ordered   Signed and Held  HIV antibody  Once,   R     Signed and Held   Signed and Held  Culture, blood (routine x 2) Call MD if unable to obtain prior to antibiotics being given  BLOOD  CULTURE X 2,   R    Comments:  If blood cultures drawn in Emergency Department - Do not draw and cancel order    Signed and Held   Signed and Held  Culture, sputum-assessment  Once,   R     Signed and Held   Signed and Held  Gram stain  Once,   R     Signed and Held   Signed and Held  Strep pneumoniae urinary antigen  Once,   R     Signed and Held   Signed and Held  CBC  (heparin)  Once,   R    Comments:  Baseline for heparin therapy IF NOT ALREADY DRAWN.  Notify MD if PLT < 100 K.    Signed and Held   Signed and Held  Creatinine, serum  (heparin)  Once,   R    Comments:  Baseline for heparin therapy IF NOT ALREADY DRAWN.    Signed and Held   Signed and Held  Basic metabolic panel  Tomorrow morning,   R     Signed and Held   Signed and Held  CBC  Tomorrow morning,   R     Signed and Held      Vitals/Pain Today's Vitals   09/06/17 1355 09/06/17 1403 09/06/17 1507 09/06/17 1748  BP:  (!) 148/63 132/68 (!) 155/67  Pulse:  74 72 83  Resp:   14 20  Temp:  98.4 F (36.9 C)    SpO2: 100% 100% 100% 98%    Isolation Precautions No active isolations  Medications Medications  0.9 %  sodium chloride infusion (20 mL/hr Intravenous New Bag/Given 09/06/17 1550)  vancomycin (VANCOCIN) IVPB 1000 mg/200 mL premix (not administered)  piperacillin-tazobactam (ZOSYN) IVPB 3.375 g (3.375 g Intravenous New Bag/Given 09/06/17 1747)    Mobility walks with person assist

## 2017-09-06 NOTE — Progress Notes (Signed)
Pharmacy Antibiotic Note  Samantha Zamora is a 83 y.o. female with dementia transferred to [facility] ED from [facility] on 09/06/17 for evaluation of AMS and fever.  To start broad abx with vancomycin and cefepime for suspected HCAP.  - scr 0.72 (crcl~38- rounded scr to 0.8 for age)  Plan: - change cefepime to 1gm IV q12h for low crcl - vancomycin 1000 mg x1 per MD, then 750 mg IV q24h  ____________________________________  Weight: 115 lb (52.2 kg)  Temp (24hrs), Avg:98.3 F (36.8 C), Min:98.2 F (36.8 C), Max:98.4 F (36.9 C)   Recent Labs Lab 09/06/17 1409  WBC 5.8  CREATININE 0.72    Estimated Creatinine Clearance: 38.5 mL/min (by C-G formula based on SCr of 0.72 mg/dL).    Allergies  Allergen Reactions  . Ibuprofen     (motrin)  Other reaction(s): Bleeding     Thank you for allowing pharmacy to be a part of this patient's care.  Lynelle Doctor 09/06/2017 9:11 PM

## 2017-09-06 NOTE — ED Notes (Signed)
Cultures x1 set drawn prior to antibiotics

## 2017-09-07 LAB — CBC
HEMATOCRIT: 36.1 % (ref 36.0–46.0)
HEMOGLOBIN: 10.8 g/dL — AB (ref 12.0–15.0)
MCH: 28.1 pg (ref 26.0–34.0)
MCHC: 29.9 g/dL — AB (ref 30.0–36.0)
MCV: 93.8 fL (ref 78.0–100.0)
PLATELETS: 193 10*3/uL (ref 150–400)
RBC: 3.85 MIL/uL — AB (ref 3.87–5.11)
RDW: 12.8 % (ref 11.5–15.5)
WBC: 5 10*3/uL (ref 4.0–10.5)

## 2017-09-07 LAB — STREP PNEUMONIAE URINARY ANTIGEN: Strep Pneumo Urinary Antigen: NEGATIVE

## 2017-09-07 LAB — BASIC METABOLIC PANEL
ANION GAP: 7 (ref 5–15)
BUN: 16 mg/dL (ref 6–20)
CALCIUM: 9.7 mg/dL (ref 8.9–10.3)
CHLORIDE: 99 mmol/L — AB (ref 101–111)
CO2: 36 mmol/L — AB (ref 22–32)
Creatinine, Ser: 0.85 mg/dL (ref 0.44–1.00)
GFR calc non Af Amer: 59 mL/min — ABNORMAL LOW (ref >60)
Glucose, Bld: 94 mg/dL (ref 65–99)
POTASSIUM: 4.1 mmol/L (ref 3.5–5.1)
Sodium: 142 mmol/L (ref 135–145)

## 2017-09-07 LAB — HIV ANTIBODY (ROUTINE TESTING W REFLEX): HIV SCREEN 4TH GENERATION: NONREACTIVE

## 2017-09-07 MED ORDER — LORAZEPAM 2 MG/ML IJ SOLN
0.5000 mg | Freq: Once | INTRAMUSCULAR | Status: AC
  Administered 2017-09-07: 0.5 mg via INTRAVENOUS
  Filled 2017-09-07: qty 1

## 2017-09-07 MED ORDER — LEVOFLOXACIN IN D5W 750 MG/150ML IV SOLN
750.0000 mg | INTRAVENOUS | Status: DC
  Administered 2017-09-07 – 2017-09-11 (×3): 750 mg via INTRAVENOUS
  Filled 2017-09-07 (×5): qty 150

## 2017-09-07 NOTE — Progress Notes (Signed)
Pharmacy Antibiotic Note  Samantha Zamora is a 83 y.o. female with dementia transferred to [facility] ED from [facility] on 09/06/17 for evaluation of AMS and fever.  To start broad abx with vancomycin and cefepime for suspected HCAP.  Plan:  Abx changed to Levaquin for PNA, dose 750mg  q48h  Height: 5' 2.99" (160 cm) Weight: 115 lb 1.3 oz (52.2 kg) IBW/kg (Calculated) : 52.38  Temp (24hrs), Avg:98.2 F (36.8 C), Min:97.9 F (36.6 C), Max:98.4 F (36.9 C)   Recent Labs Lab 09/06/17 1409 09/07/17 0414  WBC 5.8 5.0  CREATININE 0.72 0.85    Estimated Creatinine Clearance: 36.3 mL/min (by C-G formula based on SCr of 0.85 mg/dL).    Allergies  Allergen Reactions  . Ibuprofen     (motrin)  Other reaction(s): Bleeding   Antimicrobials this admission:  10/10 zosyn x1 10/10 cefepime>> 10/11 10/10 vanc>> 10/11 10/11 Levaquin  Dose adjustments this admission:   Microbiology results:  10/10 BCx: sent 10/10 Strep pneumo: neg  Thank you for allowing pharmacy to be a part of this patient's care.  Minda Ditto PharmD Pager [phone] 09/07/2017, 1:37 PM

## 2017-09-07 NOTE — Progress Notes (Signed)
PROGRESS NOTE    Samantha Zamora  TKZ:[id number] DOB: [birth year] DOA: 09/06/2017 PCP: Flossie Buffy, NP    Brief Narrative:   83 year old presenting with suspected hospital-acquired pneumonia patient was seen at prior hospital less than 2 weeks ago roughly after a fall which left her with fractured ribs.    Assessment & Plan:   Active Problems:   HCAP (healthcare-associated pneumonia) - Pt was on Vancomycin and Cefepime , will transition to levaquin today. - continue pneumonia order set - WBC within normal limits on last check.    COPD (chronic obstructive pulmonary disease) ([place]) - Continue prior to admission medication regimen.  - No wheezes or signs consistent with exacerbation.  - continue albuterol prn    Chronic rhinitis - Stable on flonase    Anxiety - stable on prior to admission medication regimen.  - Will continue TCA at bedtime.   DVT prophylaxis: Heparin  Code Status: Full Family Communication: None at bedside.  Disposition Plan: plan for mobilization today. Possible d/c in 1-2 days with continued improvement   Consultants:   None   Procedures: None   Antimicrobials: Levaquin   Subjective: Pt has no new complaints, no acute issues overnight.   Objective: Vitals:   09/06/17 2052 09/06/17 2233 09/07/17 0530 09/07/17 1402  BP: (!) 160/80  113/60 128/66  Pulse: 98  70 77  Resp: 19  20 16   Temp: 98.2 F (36.8 C)  97.9 F (36.6 C) 98.4 F (36.9 C)  TempSrc: Oral  Oral Oral  SpO2: 100%  100% 100%  Weight: 52.2 kg (115 lb) 52.2 kg (115 lb 1.3 oz)    Height:  5' 2.99" (1.6 m)      Intake/Output Summary (Last 24 hours) at 09/07/17 1546 Last data filed at 09/07/17 0426  Gross per 24 hour  Intake              160 ml  Output                0 ml  Net              160 ml   Filed Weights   09/06/17 2052 09/06/17 2233  Weight: 52.2 kg (115 lb) 52.2 kg (115 lb 1.3 oz)    Examination:  General exam: Appears calm and comfortable,  in nad. Respiratory system: rhales, no wheezes, equal chest rise, poor inspiratory effort Cardiovascular system: S1 & S2 heard, RRR. No JVD, murmurs, rubs, gallops or clicks. Gastrointestinal system: Abdomen is nondistended, soft and nontender. No organomegaly or masses felt. Normal bowel sounds heard. Central nervous system: Alert and awake, no facial asymmetry, Moves extremities equally and spontaneously Extremities: Symmetric 5 x 5 power. Skin: No rashes, lesions or ulcers, on limited exam. Psychiatry: Unable to assess well secondary to confusion and dementia    Data Reviewed: I have personally reviewed following labs and imaging studies  CBC:  Recent Labs Lab 09/06/17 1409 09/07/17 0414  WBC 5.8 5.0  HGB 11.4* 10.8*  HCT 38.4 36.1  MCV 94.1 93.8  PLT 206 573   Basic Metabolic Panel:  Recent Labs Lab 09/06/17 1409 09/07/17 0414  NA 142 142  K 4.2 4.1  CL 98* 99*  CO2 36* 36*  GLUCOSE 113* 94  BUN 17 16  CREATININE 0.72 0.85  CALCIUM 9.9 9.7   GFR: Estimated Creatinine Clearance: 36.3 mL/min (by C-G formula based on SCr of 0.85 mg/dL). Liver Function Tests:  Recent Labs Lab 09/06/17 1409  AST 22  ALT 13*  ALKPHOS 64  BILITOT 0.6  PROT 6.4*  ALBUMIN 3.5   No results for input(s): LIPASE, AMYLASE in the last 168 hours. No results for input(s): AMMONIA in the last 168 hours. Coagulation Profile: No results for input(s): INR, PROTIME in the last 168 hours. Cardiac Enzymes: No results for input(s): CKTOTAL, CKMB, CKMBINDEX, TROPONINI in the last 168 hours. BNP (last 3 results) No results for input(s): PROBNP in the last 8760 hours. HbA1C: No results for input(s): HGBA1C in the last 72 hours. CBG:  Recent Labs Lab 09/06/17 1511  GLUCAP 110*   Lipid Profile: No results for input(s): CHOL, HDL, LDLCALC, TRIG, CHOLHDL, LDLDIRECT in the last 72 hours. Thyroid Function Tests: No results for input(s): TSH, T4TOTAL, FREET4, T3FREE, THYROIDAB in the last  72 hours. Anemia Panel: No results for input(s): VITAMINB12, FOLATE, FERRITIN, TIBC, IRON, RETICCTPCT in the last 72 hours. Sepsis Labs: No results for input(s): PROCALCITON, LATICACIDVEN in the last 168 hours.  Recent Results (from the past 240 hour(s))  Culture, blood (routine x 2) Call MD if unable to obtain prior to antibiotics being given     Status: None (Preliminary result)   Collection Time: 09/06/17  9:26 PM  Result Value Ref Range Status   Specimen Description BLOOD LEFT ARM  Final   Special Requests IN PEDIATRIC BOTTLE Blood Culture adequate volume  Final   Culture   Final    NO GROWTH < 24 HOURS Performed at [facility], [address], [place], [place] [address]    Report Status PENDING  Incomplete  Culture, blood (routine x 2) Call MD if unable to obtain prior to antibiotics being given     Status: None (Preliminary result)   Collection Time: 09/06/17  9:26 PM  Result Value Ref Range Status   Specimen Description BLOOD LEFT ARM  Final   Special Requests IN PEDIATRIC BOTTLE Blood Culture adequate volume  Final   Culture   Final    NO GROWTH < 24 HOURS Performed at [facility], [place] [address], [place], [place] [address]    Report Status PENDING  Incomplete         Radiology Studies: Dg Chest 2 View  Result Date: 09/06/2017 CLINICAL DATA:  Fall 1 week ago with rib fractures. Shortness of breath. EXAM: CHEST  2 VIEW COMPARISON:  Two-view chest x-ray 03/20/2017. FINDINGS: Heart is mildly enlarged. Aortic atherosclerosis is present. There is no significant edema. The left lung is clear. The lungs remain hyperinflated bilaterally. Right eighth and ninth rib fractures are evident laterally. There is no pneumothorax. Posterior right lower lobe airspace disease is present. The visualized soft tissues and bony thorax are otherwise unremarkable. IMPRESSION: 1. Right-sided rib fractures without pneumothorax. 2. Right posterior lower lobe airspace opacity. The  differential diagnosis includes traumatic effusion with associated atelectasis. Lung contusion or infection are also considered. 3. Borderline cardiomegaly without failure. 4. Stable emphysema. Electronically Signed   By: San Morelle M.D.   On: 09/06/2017 16:51   Ct Head Wo Contrast  Result Date: 09/06/2017 CLINICAL DATA:  Altered mental status for 5 days. EXAM: CT HEAD WITHOUT CONTRAST TECHNIQUE: Contiguous axial images were obtained from the base of the skull through the vertex without intravenous contrast. COMPARISON:  None. FINDINGS: Brain: Stable age related cerebral atrophy, ventriculomegaly and periventricular white matter disease. No extra-axial fluid collections are identified. No CT findings for acute hemispheric infarction or intracranial hemorrhage. No mass lesions. The brainstem and cerebellum are normal.  Vascular: No hyperdense vessels or obvious aneurysm. Moderate scattered atherosclerotic calcifications. Skull: No acute fracture or bone lesion. Sinuses/Orbits: The paranasal sinuses and mastoid air cells are clear. The globes are intact. Other: No scalp lesions or hematoma. IMPRESSION: Age related cerebral atrophy, ventriculomegaly and periventricular white matter disease. No definite acute infarction and no intracranial hemorrhage or mass. Electronically Signed   By: Marijo Sanes M.D.   On: 09/06/2017 16:40    Scheduled Meds: . aspirin EC  81 mg Oral Daily  . budesonide (PULMICORT) nebulizer solution  0.25 mg Nebulization BID  . fluticasone  1 spray Each Nare Daily  . heparin  5,000 Units Subcutaneous Q8H  . mirtazapine  15 mg Oral QHS  . sodium chloride flush  3 mL Intravenous Q12H  . umeclidinium-vilanterol  1 puff Inhalation Daily   Continuous Infusions: . sodium chloride    . levofloxacin (LEVAQUIN) IV 750 mg (09/07/17 1422)     LOS: 1 day    Time spent: > 35 minutes  Velvet Bathe, MD [facility] Pager [phone]  If 7PM-7AM, please contact  night-coverage [email or web address] Password [facility] 09/07/2017, 3:46 PM

## 2017-09-08 DIAGNOSIS — R0602 Shortness of breath: Secondary | ICD-10-CM

## 2017-09-08 NOTE — Evaluation (Addendum)
Physical Therapy Evaluation Patient Details Name: Samantha Zamora MRN: [id number] DOB: [birth year] Today's Date: 09/08/2017   History of Present Illness  Samantha Zamora is a 83 y.o. female with medical history significant of dementia, anxiety, COPD, and seasonal allergies. Presenting after family noticed patient to be more confused above baseline. Reportedly patient fell recently resulting in rib fxs, dx with HCAP  Clinical Impression  Pt admitted with above diagnosis. Pt currently with functional limitations due to the deficits listed below (see PT Problem List).  Pt will benefit from skilled PT to increase their independence and safety with mobility to allow discharge to the venue listed below.  Pt confused, see below for further details; grossly deconditioned with poor balance and at risk for further falls; recommend SNF     Follow Up Recommendations Supervision/Assistance - 24 hour;SNF    Equipment Recommendations  None recommended by PT    Recommendations for Other Services       Precautions / Restrictions Precautions Precautions: Fall Restrictions Weight Bearing Restrictions: No      Mobility  Bed Mobility Overal bed mobility: Needs Assistance Bed Mobility: Supine to Sit     Supine to sit: Min assist     General bed mobility comments: assist with trunk and cues to complete task  Transfers Overall transfer level: Needs assistance Equipment used: 1 person hand held assist Transfers: Sit to/from Stand;Stand Pivot Transfers Sit to Stand: Min assist;Mod assist Stand pivot transfers: Mod assist       General transfer comment: cues for safety and hand placement;sit to stand x3 for toileting and pericare; pt requires incr assist with fatigue  Ambulation/Gait Ambulation/Gait assistance: Mod assist   Assistive device: 1 person hand held assist       General Gait Details: pivotal steps only  Stairs            Wheelchair Mobility    Modified Rankin  (Stroke Patients Only)       Balance Overall balance assessment: Needs assistance Sitting-balance support: Feet supported Sitting balance-Leahy Scale: Fair     Standing balance support: Single extremity supported;During functional activity Standing balance-Leahy Scale: Poor Standing balance comment: reliant on external support                             Pertinent Vitals/Pain Pain Assessment: Faces Faces Pain Scale: Hurts little more Pain Location: with movement, does not specify Pain Descriptors / Indicators: Moaning Pain Intervention(s): Monitored during session    Home Living Family/patient expects to be discharged to:: Private residence Living Arrangements: Children;Other relatives (son and dtr-in-law)               Additional Comments: pt is unreliable historian at this time, unable to give info    Prior Function                 Hand Dominance        Extremity/Trunk Assessment   Upper Extremity Assessment Upper Extremity Assessment: Defer to OT evaluation;Generalized weakness    Lower Extremity Assessment Lower Extremity Assessment: Generalized weakness       Communication   Communication: No difficulties  Cognition Arousal/Alertness: Awake/alert Behavior During Therapy: WFL for tasks assessed/performed;Flat affect Overall Cognitive Status: Impaired/Different from baseline Area of Impairment: Memory;Orientation                 Orientation Level: Disoriented to;Place;Time;Situation   Memory: Decreased short-term memory         General  Comments: intermittently confused per RN      General Comments      Exercises     Assessment/Plan    PT Assessment Patient needs continued PT services  PT Problem List Decreased strength;Decreased activity tolerance;Decreased mobility;Decreased balance;Decreased cognition       PT Treatment Interventions DME instruction;Functional mobility training;Gait training;Therapeutic  activities;Therapeutic exercise;Patient/family education;Balance training    PT Goals (Current goals can be found in the Care Plan section)  Acute Rehab PT Goals Patient Stated Goal: none stated PT Goal Formulation: Patient unable to participate in goal setting Time For Goal Achievement: 09/15/17 Potential to Achieve Goals: Good    Frequency Min 3X/week   Barriers to discharge        Co-evaluation               AM-PAC PT "6 Clicks" Daily Activity  Outcome Measure Difficulty turning over in bed (including adjusting bedclothes, sheets and blankets)?: Unable Difficulty moving from lying on back to sitting on the side of the bed? : Unable Difficulty sitting down on and standing up from a chair with arms (e.g., wheelchair, bedside commode, etc,.)?: Unable Help needed moving to and from a bed to chair (including a wheelchair)?: A Lot Help needed walking in hospital room?: A Lot Help needed climbing 3-5 steps with a railing? : A Lot 6 Click Score: 9    End of Session   Activity Tolerance: Patient tolerated treatment well Patient left: in chair;with call bell/phone within reach;with nursing/sitter in room   PT Visit Diagnosis: Muscle weakness (generalized) (M62.81);Unsteadiness on feet (R26.81)    Time: 9470-9628 PT Time Calculation (min) (ACUTE ONLY): 20 min   Charges:   PT Evaluation $PT Eval Low Complexity: 1 Low     PT G Codes:          Jereline Ticer 11-Sep-2017, 11:06 AM

## 2017-09-08 NOTE — Progress Notes (Signed)
PROGRESS NOTE    Samantha Zamora  ZOX:[id number] DOB: 08-03-1924 DOA: 09/06/2017 PCP: Flossie Buffy, NP    Brief Narrative:   83 year old presenting with suspected hospital-acquired pneumonia patient was seen at prior hospital less than 2 weeks ago roughly after a fall which left her with fractured ribs.   Assessment & Plan:   Active Problems:   HCAP (healthcare-associated pneumonia) - currently on levaqin - WBC within normal limits on last check.    COPD (chronic obstructive pulmonary disease) ([place]) - Continue prior to admission medication regimen.  - No wheezes or signs consistent with exacerbation.  - continue albuterol prn    Chronic rhinitis - Stable on flonase    Anxiety - stable on prior to admission medication regimen.  - Will continue TCA at bedtime.   DVT prophylaxis: Heparin  Code Status: Full Family Communication: None at bedside.  Disposition Plan: PT assisting with mobilization Possible d/c in 1-2 days with continued improvement   Consultants:   None   Procedures: None   Antimicrobials: Levaquin   Subjective: Family reports that patient is still confused above baseline.  Objective: Vitals:   09/07/17 1938 09/07/17 2228 09/08/17 0559 09/08/17 0815  BP:  117/67 134/69   Pulse:  67 72   Resp:  16 16   Temp:  97.9 F (36.6 C) 98 F (36.7 C)   TempSrc:  Oral Oral   SpO2: 97% 98% 100% 99%  Weight:      Height:        Intake/Output Summary (Last 24 hours) at 09/08/17 1433 Last data filed at 09/08/17 0336  Gross per 24 hour  Intake              450 ml  Output                0 ml  Net              450 ml   Filed Weights   09/06/17 2052 09/06/17 2233  Weight: 52.2 kg (115 lb) 52.2 kg (115 lb 1.3 oz)    Examination: Exam unchaged when comparted to 09/07/17  General exam: Appears calm and comfortable, in nad. Respiratory system: rhales, no wheezes, equal chest rise, poor inspiratory effort Cardiovascular system: S1 & S2  heard, RRR. No JVD, murmurs, rubs, gallops or clicks. Gastrointestinal system: Abdomen is nondistended, soft and nontender. No organomegaly or masses felt. Normal bowel sounds heard. Central nervous system: Alert and awake, no facial asymmetry, Moves extremities equally and spontaneously Extremities: Symmetric 5 x 5 power. Skin: No rashes, lesions or ulcers, on limited exam. Psychiatry: Unable to assess well secondary to confusion and dementia    Data Reviewed: I have personally reviewed following labs and imaging studies  CBC:  Recent Labs Lab 09/06/17 1409 09/07/17 0414  WBC 5.8 5.0  HGB 11.4* 10.8*  HCT 38.4 36.1  MCV 94.1 93.8  PLT 206 811   Basic Metabolic Panel:  Recent Labs Lab 09/06/17 1409 09/07/17 0414  NA 142 142  K 4.2 4.1  CL 98* 99*  CO2 36* 36*  GLUCOSE 113* 94  BUN 17 16  CREATININE 0.72 0.85  CALCIUM 9.9 9.7   GFR: Estimated Creatinine Clearance: 36.3 mL/min (by C-G formula based on SCr of 0.85 mg/dL). Liver Function Tests:  Recent Labs Lab 09/06/17 1409  AST 22  ALT 13*  ALKPHOS 64  BILITOT 0.6  PROT 6.4*  ALBUMIN 3.5   No results for input(s): LIPASE, AMYLASE in the last  168 hours. No results for input(s): AMMONIA in the last 168 hours. Coagulation Profile: No results for input(s): INR, PROTIME in the last 168 hours. Cardiac Enzymes: No results for input(s): CKTOTAL, CKMB, CKMBINDEX, TROPONINI in the last 168 hours. BNP (last 3 results) No results for input(s): PROBNP in the last 8760 hours. HbA1C: No results for input(s): HGBA1C in the last 72 hours. CBG:  Recent Labs Lab 09/06/17 1511  GLUCAP 110*   Lipid Profile: No results for input(s): CHOL, HDL, LDLCALC, TRIG, CHOLHDL, LDLDIRECT in the last 72 hours. Thyroid Function Tests: No results for input(s): TSH, T4TOTAL, FREET4, T3FREE, THYROIDAB in the last 72 hours. Anemia Panel: No results for input(s): VITAMINB12, FOLATE, FERRITIN, TIBC, IRON, RETICCTPCT in the last 72  hours. Sepsis Labs: No results for input(s): PROCALCITON, LATICACIDVEN in the last 168 hours.  Recent Results (from the past 240 hour(s))  Culture, blood (routine x 2) Call MD if unable to obtain prior to antibiotics being given     Status: None (Preliminary result)   Collection Time: 09/06/17  9:26 PM  Result Value Ref Range Status   Specimen Description BLOOD LEFT ARM  Final   Special Requests IN PEDIATRIC BOTTLE Blood Culture adequate volume  Final   Culture   Final    NO GROWTH < 24 HOURS Performed at [facility], [address] [place], [place] [address]    Report Status PENDING  Incomplete  Culture, blood (routine x 2) Call MD if unable to obtain prior to antibiotics being given     Status: None (Preliminary result)   Collection Time: 09/06/17  9:26 PM  Result Value Ref Range Status   Specimen Description BLOOD LEFT ARM  Final   Special Requests IN PEDIATRIC BOTTLE Blood Culture adequate volume  Final   Culture   Final    NO GROWTH < 24 HOURS Performed at [facility], [place] [address] [place], [place] [address]    Report Status PENDING  Incomplete         Radiology Studies: Dg Chest 2 View  Result Date: 09/06/2017 CLINICAL DATA:  Fall 1 week ago with rib fractures. Shortness of breath. EXAM: CHEST  2 VIEW COMPARISON:  Two-view chest x-ray 03/20/2017. FINDINGS: Heart is mildly enlarged. Aortic atherosclerosis is present. There is no significant edema. The left lung is clear. The lungs remain hyperinflated bilaterally. Right eighth and ninth rib fractures are evident laterally. There is no pneumothorax. Posterior right lower lobe airspace disease is present. The visualized soft tissues and bony thorax are otherwise unremarkable. IMPRESSION: 1. Right-sided rib fractures without pneumothorax. 2. Right posterior lower lobe airspace opacity. The differential diagnosis includes traumatic effusion with associated atelectasis. Lung contusion or infection are also  considered. 3. Borderline cardiomegaly without failure. 4. Stable emphysema. Electronically Signed   By: San Morelle M.D.   On: 09/06/2017 16:51   Ct Head Wo Contrast  Result Date: 09/06/2017 CLINICAL DATA:  Altered mental status for 5 days. EXAM: CT HEAD WITHOUT CONTRAST TECHNIQUE: Contiguous axial images were obtained from the base of the skull through the vertex without intravenous contrast. COMPARISON:  None. FINDINGS: Brain: Stable age related cerebral atrophy, ventriculomegaly and periventricular white matter disease. No extra-axial fluid collections are identified. No CT findings for acute hemispheric infarction or intracranial hemorrhage. No mass lesions. The brainstem and cerebellum are normal. Vascular: No hyperdense vessels or obvious aneurysm. Moderate scattered atherosclerotic calcifications. Skull: No acute fracture or bone lesion. Sinuses/Orbits: The paranasal sinuses and mastoid air  cells are clear. The globes are intact. Other: No scalp lesions or hematoma. IMPRESSION: Age related cerebral atrophy, ventriculomegaly and periventricular white matter disease. No definite acute infarction and no intracranial hemorrhage or mass. Electronically Signed   By: Marijo Sanes M.D.   On: 09/06/2017 16:40    Scheduled Meds: . aspirin EC  81 mg Oral Daily  . budesonide (PULMICORT) nebulizer solution  0.25 mg Nebulization BID  . fluticasone  1 spray Each Nare Daily  . heparin  5,000 Units Subcutaneous Q8H  . mirtazapine  15 mg Oral QHS  . sodium chloride flush  3 mL Intravenous Q12H  . umeclidinium-vilanterol  1 puff Inhalation Daily   Continuous Infusions: . sodium chloride    . levofloxacin (LEVAQUIN) IV Stopped (09/07/17 1555)     LOS: 2 days    Time spent: > 35 minutes  Velvet Bathe, MD [facility] Pager [phone]  If 7PM-7AM, please contact night-coverage [email or web address] Password [facility] 09/08/2017, 2:33 PM

## 2017-09-09 MED ORDER — LORAZEPAM 0.5 MG PO TABS: 0.2500 mg | ORAL_TABLET | Freq: Once | ORAL | Status: DC

## 2017-09-09 NOTE — Clinical Social Work Placement (Addendum)
12:14 PM Patient to bed discharged today per MD Call placed to son and updated. Patient to transport by EMS.  All paperwork sent via [place]. Facility agreeable to accept patient for admission today. HNC.  10:52 AM LCSW spoke with son: [name] regarding bed offers and choice. Son agreeable to [facility] for placement Son opted for ambulance at time of discharge. Son notified possible Discharge today AEB MD note stating if medically stable DC in AM on progress note 10/14.   LCSW notfied facility Santiago Glad at [facility]) of patient preference and possible discharge today.   Will continue to follow and assist with discharge planning.    CLINICAL SOCIAL WORK PLACEMENT  NOTE  Date:  09/09/2017  Patient Details  Name: Samantha Zamora MRN: [id number] Date of Birth: 08/30/1924  Clinical Social Work is seeking post-discharge placement for this patient at the [place] level of care (*CSW will initial, date and re-position this form in  chart as items are completed):  Yes   Patient/family provided with [facility] Work Department's list of facilities offering this level of care within the geographic area requested by the patient (or if unable, by the patient's family).  Yes   Patient/family informed of their freedom to choose among providers that offer the needed level of care, that participate in Medicare, Medicaid or managed care program needed by the patient, have an available bed and are willing to accept the patient.  Yes   Patient/family informed of [place]'s ownership interest in [facility] and [facility], as well as of the fact that they are under no obligation to receive care at these facilities.  PASRR submitted to EDS on 09/09/17     PASRR number received on     09/11/2017   Existing PASRR number confirmed on       FL2 transmitted to all facilities in geographic area requested by pt/family on 09/09/17     FL2 transmitted to all  facilities within larger geographic area on       Patient informed that his/her managed care company has contracts with or will negotiate with certain facilities, including the following:            Patient/family informed of bed offers received.  09/11/2017   Patient chooses bed at     [facility]  Physician recommends and patient chooses bed at     SNF Patient to be transferred to   on  . 09/11/2017   Patient to be transferred to facility by     EMS  Patient family notified on   of transfer. Son  Name of family member notified:      Jermone  PHYSICIAN Please sign FL2, Please prepare prescriptions, Please prepare priority discharge summary, including medications     Additional Comment:    _______________________________________________ Truitt Merle, LCSW 09/09/2017, 5:33 PM

## 2017-09-09 NOTE — NC FL2 (Signed)
[place] LEVEL OF CARE SCREENING TOOL     IDENTIFICATION  Patient Name: Samantha Zamora Birthdate: May 29, 1924 Sex: female Admission Date (Current Location): 09/06/2017  [facility] [place] and [place] Number:  Herbalist and Address:  [facility],  [address] [place], [place]      Provider Number: [id number]  Attending Physician Name and Address:  Velvet Bathe, MD  Relative Name and Phone Number:  Son Jorene Guest [phone]    Current Level of Care: Hospital Recommended Level of Care: [place] Prior Approval Number:    Date Approved/Denied:   PASRR Number:   [id number]    Discharge Plan: SNF    Current Diagnoses: Patient Active Problem List   Diagnosis Date Noted  . HCAP (healthcare-associated pneumonia) 09/06/2017  . Mild dementia 03/31/2017  . Anxiety 03/31/2017  . Depression 03/31/2017  . Chronic rhinitis 03/24/2017  . Transient alteration of awareness 03/24/2017  . 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 COPD (chronic obstructive pulmonary disease) ([place]) 03/20/2017    Orientation RESPIRATION BLADDER Height & Weight      (Disoriented)  O2 (3L [place]) Continent Weight: 115 lb 1.3 oz (52.2 kg) Height:  5' 2.99" (160 cm)  BEHAVIORAL SYMPTOMS/MOOD NEUROLOGICAL BOWEL NUTRITION STATUS      Incontinent Diet (Regular diet)  AMBULATORY STATUS COMMUNICATION OF NEEDS Skin   Extensive Assist Verbally Normal                       Personal Care Assistance Level of Assistance  Bathing, Feeding, Dressing Bathing Assistance: Maximum assistance Feeding assistance: Limited assistance Dressing Assistance: Maximum assistance     Functional Limitations Info  Sight, Hearing, Speech Sight Info: Adequate Hearing Info: Impaired Speech Info: Adequate    SPECIAL CARE FACTORS FREQUENCY  PT (By licensed PT)     PT Frequency: 5x              Contractures Contractures Info: Not present    Additional Factors Info   Code Status, Allergies Code Status Info: Full Allergies Info: Ibuprofen           Current Medications (09/09/2017):  This is the current hospital active medication list Current Facility-Administered Medications  Medication Dose Route Frequency Provider Last Rate Last Dose  . 0.9 %  sodium chloride infusion  250 mL Intravenous PRN Velvet Bathe, MD      . albuterol (PROVENTIL) (2.5 MG/3ML) 0.083% nebulizer solution 2.5 mg  2.5 mg Nebulization Q6H PRN Velvet Bathe, MD      . aspirin EC tablet 81 mg  81 mg Oral Daily Velvet Bathe, MD   81 mg at 09/09/17 1059  . budesonide (PULMICORT) nebulizer solution 0.25 mg  0.25 mg Nebulization BID Velvet Bathe, MD   0.25 mg at 09/09/17 1044  . fluticasone (FLONASE) 50 MCG/ACT nasal spray 1 spray  1 spray Each Nare Daily Velvet Bathe, MD   1 spray at 09/09/17 1059  . heparin injection 5,000 Units  5,000 Units Subcutaneous Q8H Velvet Bathe, MD   5,000 Units at 09/09/17 1446  . levofloxacin (LEVAQUIN) IVPB 750 mg  750 mg Intravenous Q48H Minda Ditto, RPH   Stopped at 09/09/17 1616  . LORazepam (ATIVAN) tablet 0.25 mg  0.25 mg Oral Once Velvet Bathe, MD      . mirtazapine (REMERON) tablet 15 mg  15 mg Oral QHS Velvet Bathe, MD   15 mg at 09/08/17 2132  . sodium chloride flush (NS) 0.9 % injection  3 mL  3 mL Intravenous Q12H Velvet Bathe, MD   3 mL at 09/06/17 2233  . sodium chloride flush (NS) 0.9 % injection 3 mL  3 mL Intravenous PRN Velvet Bathe, MD      . umeclidinium-vilanterol (ANORO ELLIPTA) 62.5-25 MCG/INH 1 puff  1 puff Inhalation Daily Velvet Bathe, MD   1 puff at 09/09/17 1046     Discharge Medications: Please see discharge summary for a list of discharge medications.  Relevant Imaging Results:  Relevant Lab Results:   Additional Information SSN: [id number]  Truitt Merle, LCSW

## 2017-09-09 NOTE — Progress Notes (Signed)
PROGRESS NOTE    Samantha Zamora  ATF:[id number] DOB: [birth year] DOA: 09/06/2017 PCP: Flossie Buffy, NP    Brief Narrative:   83 year old presenting with suspected hospital-acquired pneumonia patient was seen at prior hospital less than 2 weeks ago roughly after a fall which left her with fractured ribs.   Assessment & Plan:   Active Problems:   HCAP (healthcare-associated pneumonia) - currently on levaqin, recovering slowly. Still confused per family - WBC within normal limits on last check.    COPD (chronic obstructive pulmonary disease) ([place]) - Continue prior to admission medication regimen.  - No wheezes or signs consistent with exacerbation.  - continue albuterol prn    Chronic rhinitis - Stable on flonase    Anxiety - stable on prior to admission medication regimen.  - Will continue TCA at bedtime.   DVT prophylaxis: Heparin  Code Status: Full Family Communication: None at bedside.  Disposition Plan: PT assisting with mobilization Possible d/c in 1-2 days with continued improvement   Consultants:   None   Procedures: None   Antimicrobials: Levaquin   Subjective: Still confused per my discussion with family  Objective: Vitals:   09/08/17 2017 09/09/17 0434 09/09/17 1044 09/09/17 1406  BP: 128/71 115/69  125/60  Pulse: 90 73  65  Resp: 16 14  15   Temp: 98 F (36.7 C) 98 F (36.7 C)  97.9 F (36.6 C)  TempSrc: Oral Oral  Oral  SpO2: 100% 100% 99% 100%  Weight:      Height:        Intake/Output Summary (Last 24 hours) at 09/09/17 1510 Last data filed at 09/09/17 0612  Gross per 24 hour  Intake              100 ml  Output              200 ml  Net             -100 ml   Filed Weights   09/06/17 2052 09/06/17 2233  Weight: 52.2 kg (115 lb) 52.2 kg (115 lb 1.3 oz)    Examination: Exam unchaged when comparted to 09/08/17  General exam: Appears calm and comfortable, in nad. Respiratory system: rhales, no wheezes, equal chest  rise, poor inspiratory effort Cardiovascular system: S1 & S2 heard, RRR. No JVD, murmurs, rubs, gallops or clicks. Gastrointestinal system: Abdomen is nondistended, soft and nontender. No organomegaly or masses felt. Normal bowel sounds heard. Central nervous system: Alert and awake, no facial asymmetry, Moves extremities equally and spontaneously Extremities: Symmetric 5 x 5 power. Skin: No rashes, lesions or ulcers, on limited exam. Psychiatry: Unable to assess well secondary to confusion and dementia    Data Reviewed: I have personally reviewed following labs and imaging studies  CBC:  Recent Labs Lab 09/06/17 1409 09/07/17 0414  WBC 5.8 5.0  HGB 11.4* 10.8*  HCT 38.4 36.1  MCV 94.1 93.8  PLT 206 270   Basic Metabolic Panel:  Recent Labs Lab 09/06/17 1409 09/07/17 0414  NA 142 142  K 4.2 4.1  CL 98* 99*  CO2 36* 36*  GLUCOSE 113* 94  BUN 17 16  CREATININE 0.72 0.85  CALCIUM 9.9 9.7   GFR: Estimated Creatinine Clearance: 36.3 mL/min (by C-G formula based on SCr of 0.85 mg/dL). Liver Function Tests:  Recent Labs Lab 09/06/17 1409  AST 22  ALT 13*  ALKPHOS 64  BILITOT 0.6  PROT 6.4*  ALBUMIN 3.5   No results for input(s): LIPASE,  AMYLASE in the last 168 hours. No results for input(s): AMMONIA in the last 168 hours. Coagulation Profile: No results for input(s): INR, PROTIME in the last 168 hours. Cardiac Enzymes: No results for input(s): CKTOTAL, CKMB, CKMBINDEX, TROPONINI in the last 168 hours. BNP (last 3 results) No results for input(s): PROBNP in the last 8760 hours. HbA1C: No results for input(s): HGBA1C in the last 72 hours. CBG:  Recent Labs Lab 09/06/17 1511  GLUCAP 110*   Lipid Profile: No results for input(s): CHOL, HDL, LDLCALC, TRIG, CHOLHDL, LDLDIRECT in the last 72 hours. Thyroid Function Tests: No results for input(s): TSH, T4TOTAL, FREET4, T3FREE, THYROIDAB in the last 72 hours. Anemia Panel: No results for input(s):  VITAMINB12, FOLATE, FERRITIN, TIBC, IRON, RETICCTPCT in the last 72 hours. Sepsis Labs: No results for input(s): PROCALCITON, LATICACIDVEN in the last 168 hours.  Recent Results (from the past 240 hour(s))  Culture, blood (routine x 2) Call MD if unable to obtain prior to antibiotics being given     Status: None (Preliminary result)   Collection Time: 09/06/17  9:26 PM  Result Value Ref Range Status   Specimen Description BLOOD LEFT ARM  Final   Special Requests IN PEDIATRIC BOTTLE Blood Culture adequate volume  Final   Culture   Final    NO GROWTH 2 DAYS Performed at [facility], [address], [place], [place] [address]    Report Status PENDING  Incomplete  Culture, blood (routine x 2) Call MD if unable to obtain prior to antibiotics being given     Status: None (Preliminary result)   Collection Time: 09/06/17  9:26 PM  Result Value Ref Range Status   Specimen Description BLOOD LEFT ARM  Final   Special Requests IN PEDIATRIC BOTTLE Blood Culture adequate volume  Final   Culture   Final    NO GROWTH 2 DAYS Performed at [facility], [place] [address], [place], [place] [address]    Report Status PENDING  Incomplete         Radiology Studies: No results found.  Scheduled Meds: . aspirin EC  81 mg Oral Daily  . budesonide (PULMICORT) nebulizer solution  0.25 mg Nebulization BID  . fluticasone  1 spray Each Nare Daily  . heparin  5,000 Units Subcutaneous Q8H  . mirtazapine  15 mg Oral QHS  . sodium chloride flush  3 mL Intravenous Q12H  . umeclidinium-vilanterol  1 puff Inhalation Daily   Continuous Infusions: . sodium chloride    . levofloxacin (LEVAQUIN) IV 750 mg (09/09/17 1446)     LOS: 3 days    Time spent: > 35 minutes  Velvet Bathe, MD [facility] Pager [phone]  If 7PM-7AM, please contact night-coverage [email or web address] Password [email or web address] 09/09/2017, 3:10 PM

## 2017-09-09 NOTE — Clinical Social Work Note (Addendum)
Clinical Social Work Assessment  Patient Details  Name: Samantha Zamora MRN: [id number] Date of Birth: Nov 17, 1924  Date of referral:  09/09/17               Reason for consult:  Discharge Planning                Permission sought to share information with:  Family Supports Permission granted to share information::  Yes, Verbal Permission Granted  Name::     Jorene Guest  Agency::  SNFs  Relationship::  Son  Contact Information:  [phone]  Housing/Transportation Living arrangements for the past 2 months:  [place] of Information:  Adult Children Patient Interpreter Needed:  None Criminal Activity/Legal Involvement Pertinent to Current Situation/Hospitalization:  No - Comment as needed Significant Relationships:  Adult Children Lives with:  Adult Children (Son) Do you feel safe going back to the place where you live?  Yes Need for family participation in patient care:  Yes (Comment)  Care giving concerns:  No care giving concerns identified.    Social Worker assessment / plan:  CSW met with pt and family at bedside to address consult for "Skilled nursing facility placement." CSW introduced self and explained Social Work roles. Pt from home living with son and daughter-in-law-Leseanda Malen Gauze [phone]). Good family support. P/T recommending SNF. Pt and family agreeable to fax out to [facility]. Preference is [facility] FL-2 completed and faxed out. PASRR under manual review. CSW continuing to follow for discharge needs.   Employment status:  Retired Forensic scientist:  Medicare PT Recommendations:  [place] / Referral to community resources:  [place]  Patient/Family's Response to care:  Family agreeable  to plan of care and understand pt's need for  STR.   Patient/Family's Understanding of and Emotional Response to Diagnosis, Current Treatment, and Prognosis:  Pt disoriented.  Family understanding of current medical state and are relieved that pt is agreeable to STR at [facility].  Emotional Assessment Appearance:  Appears stated age Attitude/Demeanor/Rapport:  Other (Appropriate) Affect (typically observed):  Unable to Assess (Pt sleeping most of assessment) Orientation:   (Disoriented) Alcohol / Substance use:  Other Psych involvement (Current and /or in the community):  No (Comment)  Discharge Needs  Concerns to be addressed:  Discharge Planning Concerns, Care Coordination Readmission within the last 30 days:  No Current discharge risk:  Dependent with Mobility, Cognitively Impaired Barriers to Discharge:  Continued Medical Work up, Programmer, applications ([place])   Truitt Merle, LCSW 09/09/2017, 5:36 PM

## 2017-09-10 NOTE — Progress Notes (Signed)
PROGRESS NOTE    Samantha Zamora  KKX:[id number] DOB: 1924/11/06 DOA: 09/06/2017 PCP: Flossie Buffy, NP    Brief Narrative:   83 year old presenting with suspected hospital-acquired pneumonia patient was seen at prior hospital less than 2 weeks ago roughly after a fall which left her with fractured ribs.   Assessment & Plan:   Active Problems:   HCAP (healthcare-associated pneumonia) - currently on levaqin, has had a slow recovery - WBC wnl on last check    COPD (chronic obstructive pulmonary disease) ([place]) - Continue prior to admission medication regimen.  - Still no wheezes or signs consistent with exacerbation.  - continue albuterol prn    Chronic rhinitis - Stable on flonase    Anxiety - stable on prior to admission medication regimen.  - Will continue TCA at bedtime.   DVT prophylaxis: Heparin  Code Status: Full Family Communication: None at bedside.  Disposition Plan: PT assisting with mobilization. Most likely d/c next am.   Consultants:   None   Procedures: None   Antimicrobials: Levaquin   Subjective: Pt has no new complaints. No acute issues overnight.  Objective: Vitals:   09/09/17 2030 09/09/17 2113 09/10/17 0531 09/10/17 1151  BP:  116/80 (!) 151/67   Pulse:  61 71   Resp:   16   Temp:  98 F (36.7 C) 97.6 F (36.4 C)   TempSrc:  Oral Oral   SpO2: 98% 100% 100% 97%  Weight:      Height:        Intake/Output Summary (Last 24 hours) at 09/10/17 1351 Last data filed at 09/10/17 1339  Gross per 24 hour  Intake              100 ml  Output              400 ml  Net             -300 ml   Filed Weights   09/06/17 2052 09/06/17 2233  Weight: 52.2 kg (115 lb) 52.2 kg (115 lb 1.3 oz)    Examination: Exam unchaged when comparted to 09/09/17  General exam: Appears calm and comfortable, in nad. Respiratory system: rhales, no wheezes, equal chest rise, poor inspiratory effort Cardiovascular system: S1 & S2 heard, RRR. No JVD,  murmurs, rubs, gallops or clicks. Gastrointestinal system: Abdomen is nondistended, soft and nontender. No organomegaly or masses felt. Normal bowel sounds heard. Central nervous system: Alert and awake, no facial asymmetry, Moves extremities equally and spontaneously Extremities: Symmetric 5 x 5 power. Skin: No rashes, lesions or ulcers, on limited exam. Psychiatry: Unable to assess well secondary to confusion and dementia    Data Reviewed: I have personally reviewed following labs and imaging studies  CBC:  Recent Labs Lab 09/06/17 1409 09/07/17 0414  WBC 5.8 5.0  HGB 11.4* 10.8*  HCT 38.4 36.1  MCV 94.1 93.8  PLT 206 169   Basic Metabolic Panel:  Recent Labs Lab 09/06/17 1409 09/07/17 0414  NA 142 142  K 4.2 4.1  CL 98* 99*  CO2 36* 36*  GLUCOSE 113* 94  BUN 17 16  CREATININE 0.72 0.85  CALCIUM 9.9 9.7   GFR: Estimated Creatinine Clearance: 36.3 mL/min (by C-G formula based on SCr of 0.85 mg/dL). Liver Function Tests:  Recent Labs Lab 09/06/17 1409  AST 22  ALT 13*  ALKPHOS 64  BILITOT 0.6  PROT 6.4*  ALBUMIN 3.5   No results for input(s): LIPASE, AMYLASE in the last 168  hours. No results for input(s): AMMONIA in the last 168 hours. Coagulation Profile: No results for input(s): INR, PROTIME in the last 168 hours. Cardiac Enzymes: No results for input(s): CKTOTAL, CKMB, CKMBINDEX, TROPONINI in the last 168 hours. BNP (last 3 results) No results for input(s): PROBNP in the last 8760 hours. HbA1C: No results for input(s): HGBA1C in the last 72 hours. CBG:  Recent Labs Lab 09/06/17 1511  GLUCAP 110*   Lipid Profile: No results for input(s): CHOL, HDL, LDLCALC, TRIG, CHOLHDL, LDLDIRECT in the last 72 hours. Thyroid Function Tests: No results for input(s): TSH, T4TOTAL, FREET4, T3FREE, THYROIDAB in the last 72 hours. Anemia Panel: No results for input(s): VITAMINB12, FOLATE, FERRITIN, TIBC, IRON, RETICCTPCT in the last 72 hours. Sepsis Labs: No  results for input(s): PROCALCITON, LATICACIDVEN in the last 168 hours.  Recent Results (from the past 240 hour(s))  Culture, blood (routine x 2) Call MD if unable to obtain prior to antibiotics being given     Status: None (Preliminary result)   Collection Time: 09/06/17  9:26 PM  Result Value Ref Range Status   Specimen Description BLOOD LEFT ARM  Final   Special Requests IN PEDIATRIC BOTTLE Blood Culture adequate volume  Final   Culture   Final    NO GROWTH 3 DAYS Performed at [facility], [address] [place], [place] [address]    Report Status PENDING  Incomplete  Culture, blood (routine x 2) Call MD if unable to obtain prior to antibiotics being given     Status: None (Preliminary result)   Collection Time: 09/06/17  9:26 PM  Result Value Ref Range Status   Specimen Description BLOOD LEFT ARM  Final   Special Requests IN PEDIATRIC BOTTLE Blood Culture adequate volume  Final   Culture   Final    NO GROWTH 3 DAYS Performed at [facility], [place] [address], [place], [place] [address]    Report Status PENDING  Incomplete         Radiology Studies: No results found.  Scheduled Meds: . aspirin EC  81 mg Oral Daily  . budesonide (PULMICORT) nebulizer solution  0.25 mg Nebulization BID  . fluticasone  1 spray Each Nare Daily  . heparin  5,000 Units Subcutaneous Q8H  . LORazepam  0.25 mg Oral Once  . mirtazapine  15 mg Oral QHS  . sodium chloride flush  3 mL Intravenous Q12H  . umeclidinium-vilanterol  1 puff Inhalation Daily   Continuous Infusions: . sodium chloride    . levofloxacin (LEVAQUIN) IV Stopped (09/09/17 1616)     LOS: 4 days    Time spent: > 35 minutes  Velvet Bathe, MD [facility] Pager [phone]  If 7PM-7AM, please contact night-coverage [email or web address] Password [email or web address] 09/10/2017, 1:51 PM

## 2017-09-10 NOTE — Progress Notes (Signed)
Pharmacy Antibiotic Note  Samantha Zamora is a 83 y.o. female with dementia transferred to [facility] ED from [facility] on 09/06/17 for evaluation of AMS and fever.  To start broad abx with vancomycin and cefepime for suspected HCAP which have subsequently been changed to levofloxaxin  Today, 09/10/2017  Day #5 antibiotics  No recent labs  afebrile  Plan:   Levofloxaxin 750mg  q48h  Suggest d/c after 10/15 dose, will complete 7-day course  Height: 5' 2.99" (160 cm) Weight: 115 lb 1.3 oz (52.2 kg) IBW/kg (Calculated) : 52.38  Temp (24hrs), Avg:97.8 F (36.6 C), Min:97.6 F (36.4 C), Max:98 F (36.7 C)   Recent Labs Lab 09/06/17 1409 09/07/17 0414  WBC 5.8 5.0  CREATININE 0.72 0.85    Estimated Creatinine Clearance: 36.3 mL/min (by C-G formula based on SCr of 0.85 mg/dL).    Allergies  Allergen Reactions  . Ibuprofen     (motrin)  Other reaction(s): Bleeding   Antimicrobials this admission:  10/10 zosyn x1 10/10 cefepime>> 10/11 10/10 vanc>> 10/11 10/11 Levaquin  Dose adjustments this admission:   Microbiology results:  10/10 BCx: sent 10/10 Strep pneumo: neg  Thank you for allowing pharmacy to be a part of this patient's care.  Doreene Eland, PharmD, BCPS.   Pager: [phone] 09/10/2017 11:50 AM

## 2017-09-11 LAB — CULTURE, BLOOD (ROUTINE X 2)
CULTURE: NO GROWTH
Culture: NO GROWTH
SPECIAL REQUESTS: ADEQUATE
Special Requests: ADEQUATE

## 2017-09-11 MED ORDER — LEVOFLOXACIN 750 MG PO TABS: 750.0000 mg | ORAL_TABLET | Freq: Every day | ORAL | 0 refills | Status: AC

## 2017-09-11 NOTE — Progress Notes (Signed)
Patient discharged to SNF, copies of all discharge medications and instructions sent to facility. Report called to receiving nurse. Patient to be transported via EMS.

## 2017-09-11 NOTE — Progress Notes (Signed)
Patient on chronic O2 at home at 3L. Will d/c to facility with O2 on 3L.

## 2017-09-11 NOTE — Discharge Summary (Signed)
Physician Discharge Summary  [facility] S[facility]:[id number] DOB: Mar 28, 1924 DOA: 09/06/2017  PCP: Flossie Buffy, NP  Admit date: 09/06/2017 Discharge date: 09/11/2017  Time spent: > 35 minutes  Recommendations for Outpatient Follow-up:  1. Monitor glucose levels. 2. Consider PFT's down the road as patient condition improves from current pna   Discharge Diagnoses:  Active Problems:   HCAP (healthcare-associated pneumonia)   COPD (chronic obstructive pulmonary disease) ([place])   Chronic rhinitis   Anxiety   Discharge Condition: stable  Diet recommendation: General diet  Filed Weights   09/06/17 2052 09/06/17 2233  Weight: 52.2 kg (115 lb) 52.2 kg (115 lb 1.3 oz)    History of present illness:  83 y.o. female with medical history significant of dementia, anxiety, COPD, and seasonal allergies. Presenting after family noticed patient to be more confused above baseline. Reportedly patient fell within the last few weeks and landed on her right side back training several ribs. Since then family reports that she has not been at her baseline.  Hospital Course:  Active Problems: HCAP (healthcare-associated pneumonia) - improved on antibiotics. Will continue levaquin for 1 more day and then she will have completed a satisfactory course of antibiotics  COPD (chronic obstructive pulmonary disease) ([place]) - Continue prior to admission medication regimen.  - albuterol prn, no wheezes or exacerbation in house.   Chronic rhinitis - Stable on flonase  Anxiety - stable on prior to admission medication regimen.  - Will continue TCA at bedtime.   Procedures:  None  Consultations:  None  Discharge Exam: Vitals:   09/11/17 0402 09/11/17 0841  BP: 119/67   Pulse: 84 75  Resp: 20 18  Temp: 98.4 F (36.9 C)   SpO2: 97% 96%    General: Pt in nad, alert and awake Cardiovascular: rrr, no rubs Respiratory: no increased wob, no wheezes  Discharge  Instructions   Discharge Instructions    Call MD for:  difficulty breathing, headache or visual disturbances    Complete by:  As directed    Call MD for:  temperature >100.4    Complete by:  As directed    Diet - low sodium heart healthy    Complete by:  As directed    Discharge instructions    Complete by:  As directed    Please have patient follow up with primary care physician at facility within the next 1-2 days   Increase activity slowly    Complete by:  As directed      Current Discharge Medication List    START taking these medications   Details  levofloxacin (LEVAQUIN) 750 MG tablet Take 1 tablet (750 mg total) by mouth daily. Qty: 1 tablet, Refills: 0      CONTINUE these medications which have NOT CHANGED   Details  aspirin EC 81 MG tablet Take 81 mg by mouth daily.    !! Fluticasone-Umeclidin-Vilant (TRELEGY ELLIPTA) 100-62.5-25 MCG/INH AEPB Inhale 1 Inhaler into the lungs daily. Qty: 60 each, Refills: 11   Associated Diagnoses: Chronic obstructive pulmonary disease, unspecified COPD type (HCC)    mirtazapine (REMERON) 15 MG tablet Take 1 tablet at night Qty: 90 tablet, Refills: 3   Associated Diagnoses: Anxiety; Other depression    !! Fluticasone-Umeclidin-Vilant (TRELEGY ELLIPTA) 100-62.5-25 MCG/INH AEPB Inhale 1 Inhaler into the lungs daily. Qty: 60 each, Refills: 3   Associated Diagnoses: Chronic obstructive pulmonary disease, unspecified COPD type ([place])    !! Fluticasone-Umeclidin-Vilant (TRELEGY ELLIPTA) 100-62.5-25 MCG/INH AEPB Inhale 1 Inhaler into the lungs daily.  Qty: 60 each, Refills: 3   Associated Diagnoses: Chronic obstructive pulmonary disease, unspecified COPD type ([place])     !! - Potential duplicate medications found. Please discuss with provider.    STOP taking these medications     fluticasone (FLONASE) 50 MCG/ACT nasal spray        Allergies  Allergen Reactions  . Ibuprofen     (motrin)  Other reaction(s): Bleeding   Contact  information for after-discharge care    [facility] .   Specialty:  [place] information: [address] [place] [phone]               The results of significant diagnostics from this hospitalization (including imaging, microbiology, ancillary and laboratory) are listed below for reference.    Significant Diagnostic Studies: Dg Chest 2 View  Result Date: 09/06/2017 CLINICAL DATA:  Fall 1 week ago with rib fractures. Shortness of breath. EXAM: CHEST  2 VIEW COMPARISON:  Two-view chest x-ray 03/20/2017. FINDINGS: Heart is mildly enlarged. Aortic atherosclerosis is present. There is no significant edema. The left lung is clear. The lungs remain hyperinflated bilaterally. Right eighth and ninth rib fractures are evident laterally. There is no pneumothorax. Posterior right lower lobe airspace disease is present. The visualized soft tissues and bony thorax are otherwise unremarkable. IMPRESSION: 1. Right-sided rib fractures without pneumothorax. 2. Right posterior lower lobe airspace opacity. The differential diagnosis includes traumatic effusion with associated atelectasis. Lung contusion or infection are also considered. 3. Borderline cardiomegaly without failure. 4. Stable emphysema. Electronically Signed   By: San Morelle M.D.   On: 09/06/2017 16:51   Ct Head Wo Contrast  Result Date: 09/06/2017 CLINICAL DATA:  Altered mental status for 5 days. EXAM: CT HEAD WITHOUT CONTRAST TECHNIQUE: Contiguous axial images were obtained from the base of the skull through the vertex without intravenous contrast. COMPARISON:  None. FINDINGS: Brain: Stable age related cerebral atrophy, ventriculomegaly and periventricular white matter disease. No extra-axial fluid collections are identified. No CT findings for acute hemispheric infarction or intracranial hemorrhage. No mass lesions. The brainstem and cerebellum  are normal. Vascular: No hyperdense vessels or obvious aneurysm. Moderate scattered atherosclerotic calcifications. Skull: No acute fracture or bone lesion. Sinuses/Orbits: The paranasal sinuses and mastoid air cells are clear. The globes are intact. Other: No scalp lesions or hematoma. IMPRESSION: Age related cerebral atrophy, ventriculomegaly and periventricular white matter disease. No definite acute infarction and no intracranial hemorrhage or mass. Electronically Signed   By: Marijo Sanes M.D.   On: 09/06/2017 16:40    Microbiology: Recent Results (from the past 240 hour(s))  Culture, blood (routine x 2) Call MD if unable to obtain prior to antibiotics being given     Status: None (Preliminary result)   Collection Time: 09/06/17  9:26 PM  Result Value Ref Range Status   Specimen Description BLOOD LEFT ARM  Final   Special Requests IN PEDIATRIC BOTTLE Blood Culture adequate volume  Final   Culture   Final    NO GROWTH 4 DAYS Performed at [facility], [address] [place], [place] [address]    Report Status PENDING  Incomplete  Culture, blood (routine x 2) Call MD if unable to obtain prior to antibiotics being given     Status: None (Preliminary result)   Collection Time: 09/06/17  9:26 PM  Result Value Ref Range Status   Specimen Description BLOOD LEFT ARM  Final   Special  Requests IN PEDIATRIC BOTTLE Blood Culture adequate volume  Final   Culture   Final    NO GROWTH 4 DAYS Performed at [facility], [place] [address], [place], [place] [address]    Report Status PENDING  Incomplete     Labs: Basic Metabolic Panel:  Recent Labs Lab 09/06/17 1409 09/07/17 0414  NA 142 142  K 4.2 4.1  CL 98* 99*  CO2 36* 36*  GLUCOSE 113* 94  BUN 17 16  CREATININE 0.72 0.85  CALCIUM 9.9 9.7   Liver Function Tests:  Recent Labs Lab 09/06/17 1409  AST 22  ALT 13*  ALKPHOS 64  BILITOT 0.6  PROT 6.4*  ALBUMIN 3.5   No results for input(s): LIPASE, AMYLASE in the last  168 hours. No results for input(s): AMMONIA in the last 168 hours. CBC:  Recent Labs Lab 09/06/17 1409 09/07/17 0414  WBC 5.8 5.0  HGB 11.4* 10.8*  HCT 38.4 36.1  MCV 94.1 93.8  PLT 206 193   Cardiac Enzymes: No results for input(s): CKTOTAL, CKMB, CKMBINDEX, TROPONINI in the last 168 hours. BNP: BNP (last 3 results) No results for input(s): BNP in the last 8760 hours.  ProBNP (last 3 results) No results for input(s): PROBNP in the last 8760 hours.  CBG:  Recent Labs Lab 09/06/17 1511  GLUCAP 110*    Signed:  Velvet Bathe MD.  [facility] 09/11/2017, 11:59 AM

## 2017-09-12 ENCOUNTER — Telehealth: Payer: Self-pay | Admitting: Neurology

## 2017-09-12 NOTE — Telephone Encounter (Signed)
Patient son wants [place] to call his wife about what is going with his mother. She is having a lot anxiety during the day and she has appt on 10-02-17 to see DR Delice Lesch but he wants her seen ASAP

## 2017-09-13 ENCOUNTER — Telehealth: Payer: Self-pay | Admitting: Neurology

## 2017-09-13 ENCOUNTER — Encounter: Payer: Self-pay | Admitting: Nurse Practitioner

## 2017-09-13 ENCOUNTER — Other Ambulatory Visit (INDEPENDENT_AMBULATORY_CARE_PROVIDER_SITE_OTHER): Payer: Medicare Other

## 2017-09-13 ENCOUNTER — Ambulatory Visit (INDEPENDENT_AMBULATORY_CARE_PROVIDER_SITE_OTHER)
Admission: RE | Admit: 2017-09-13 | Discharge: 2017-09-13 | Disposition: A | Discharge status: Home / Self Care | Payer: Medicare Other | Source: Ambulatory Visit | Attending: Nurse Practitioner | Admitting: Nurse Practitioner

## 2017-09-13 ENCOUNTER — Telehealth: Payer: Self-pay | Admitting: *Deleted

## 2017-09-13 ENCOUNTER — Telehealth: Payer: Self-pay | Admitting: Nurse Practitioner

## 2017-09-13 ENCOUNTER — Ambulatory Visit (INDEPENDENT_AMBULATORY_CARE_PROVIDER_SITE_OTHER): Payer: Medicare Other | Admitting: Nurse Practitioner

## 2017-09-13 VITALS — BP 122/60 | HR 72 | Temp 97.9°F | Ht 62.99 in | Wt 107.0 lb

## 2017-09-13 DIAGNOSIS — R410 Disorientation, unspecified: Secondary | ICD-10-CM | POA: Diagnosis not present

## 2017-09-13 DIAGNOSIS — J181 Lobar pneumonia, unspecified organism: Secondary | ICD-10-CM

## 2017-09-13 DIAGNOSIS — J449 Chronic obstructive pulmonary disease, unspecified: Secondary | ICD-10-CM

## 2017-09-13 DIAGNOSIS — F0390 Unspecified dementia without behavioral disturbance: Secondary | ICD-10-CM

## 2017-09-13 DIAGNOSIS — R531 Weakness: Secondary | ICD-10-CM | POA: Diagnosis not present

## 2017-09-13 DIAGNOSIS — J189 Pneumonia, unspecified organism: Secondary | ICD-10-CM

## 2017-09-13 DIAGNOSIS — F03A0 Unspecified dementia, mild, without behavioral disturbance, psychotic disturbance, mood disturbance, and anxiety: Secondary | ICD-10-CM

## 2017-09-13 DIAGNOSIS — J90 Pleural effusion, not elsewhere classified: Secondary | ICD-10-CM | POA: Diagnosis not present

## 2017-09-13 DIAGNOSIS — F419 Anxiety disorder, unspecified: Secondary | ICD-10-CM | POA: Diagnosis not present

## 2017-09-13 LAB — BASIC METABOLIC PANEL
BUN: 21 mg/dL (ref 6–23)
CHLORIDE: 103 meq/L (ref 96–112)
CO2: 39 mEq/L — ABNORMAL HIGH (ref 19–32)
Calcium: 10.4 mg/dL (ref 8.4–10.5)
Creatinine, Ser: 0.75 mg/dL (ref 0.40–1.20)
GFR: 93.17 mL/min (ref >60.00)
Glucose, Bld: 115 mg/dL — ABNORMAL HIGH (ref 70–99)
POTASSIUM: 4.1 meq/L (ref 3.5–5.1)
SODIUM: 145 meq/L (ref 135–145)

## 2017-09-13 LAB — CBC WITH DIFFERENTIAL/PLATELET
Basophils Absolute: 0 K/uL (ref 0.0–0.1)
Basophils Relative: 0.4 % (ref 0.0–3.0)
Eosinophils Absolute: 0.2 K/uL (ref 0.0–0.7)
Eosinophils Relative: 2.9 % (ref 0.0–5.0)
HCT: 38.4 % (ref 36.0–46.0)
Hemoglobin: 11.9 g/dL — ABNORMAL LOW (ref 12.0–15.0)
Lymphocytes Relative: 18.3 % (ref 12.0–46.0)
Lymphs Abs: 1.2 K/uL (ref 0.7–4.0)
MCHC: 31.1 g/dL (ref 30.0–36.0)
MCV: 92 fl (ref 78.0–100.0)
Monocytes Absolute: 0.6 K/uL (ref 0.1–1.0)
Monocytes Relative: 9.5 % (ref 3.0–12.0)
Neutro Abs: 4.3 K/uL (ref 1.4–7.7)
Neutrophils Relative %: 68.9 % (ref 43.0–77.0)
Platelets: 212 K/uL (ref 150.0–400.0)
RBC: 4.18 Mil/uL (ref 3.87–5.11)
RDW: 13.7 % (ref 11.5–15.5)
WBC: 6.3 K/uL (ref 4.0–10.5)

## 2017-09-13 MED ORDER — ALPRAZOLAM 0.25 MG PO TABS: 0.2500 mg | ORAL_TABLET | Freq: Two times a day (BID) | ORAL | 0 refills | Status: DC | PRN

## 2017-09-13 NOTE — Telephone Encounter (Signed)
Pt was on TCM list admitted  09/06/17 for HCAP (healthcare-associated pneumonia), COPD flare. Pt was d/c 09/11/17 to SNF...Samantha Zamora

## 2017-09-13 NOTE — Telephone Encounter (Signed)
Spoke with pt's daughter-in-law.  She states that pt's anxiety is raised.  She thinks that her family is trying to poison/kill her.  Family was in transit to pt's PCP - I advised that they relay everything to PCP

## 2017-09-13 NOTE — Telephone Encounter (Signed)
Patient's son called stating they can not come in on Wednesday 09/27/17. They can only be seen on a Monday or Tuesday, but want sooner appt.  It looks like they have cancelled and no showed several appts.   Patient was last seen in May 2018. They state since this time that her memory has gotten worse. She gets very confused and out of control. She was recently in the hospital and they gave her ativan to calm her down. He wants her seen as soon as possible to get a prescription for this medication.   She is being seen by PCP today and I advised them to talk to them about anti-anxiety medications.  I moved appt back to November 5th as requested, but patient's son was upset that another provider wouldn't see her sooner and states they may "go to [place]".   Dr. Delice Lesch- FYI.

## 2017-09-13 NOTE — Patient Instructions (Addendum)
I am afraid her symptoms may be signs of progression of dementia. She may benefit from Home PT and OT (will enter referral once son gives ok). I am hesitant about using a long acting benzodiazepine at this time due to potential adverse effects and high fall risk. Will reassess in 4weeks.(consider use of buspar or SSRI).  No acute finding on CXR and lab results.  Continue to encourage oral hydration and small frequent meals.  Follow up with neurology asap to re examine use of cholinesterase inhibitor.  Go to hospital if symptoms worsen.
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
## 2017-09-13 NOTE — Telephone Encounter (Signed)
Patient Name: Samantha Zamora DOB: [birth year] Initial Comment Caller states pt was admitted to hospital last week for pneumonia. She also has dementia. She did not like the rehab she was in, they checked her out and took her home. Daughter in law says she needs abx, she feels the pneumonia is getting worse, she has been winded and she is having hallucinations Nurse Assessment Nurse: Ronnald Ramp, RN, Miranda Date/Time (Eastern Time): 09/13/2017 11:41:44 AM Confirm and document reason for call. If symptomatic, describe symptoms. ---Caller states her mother in law was in the hospital last week for Pneumonia. She was discharged to a rehab facility but the family was not happy with the facility and decided to take her home. She has been home since Monday night. Since then she seems more SOB with activity and increased confusion. She has paranoia and refusing to eat or drink because she says they are trying to poison her. Does the patient have any new or worsening symptoms? ---Yes Will a triage be completed? ---Yes Related visit to physician within the last 2 weeks? ---Yes Does the PT have any chronic conditions? (i.e. diabetes, asthma, etc.) ---Yes List chronic conditions. ---Dementia, COPD, on O2 Is this a behavioral health or substance abuse call? ---No Guidelines Guideline Title Affirmed Question Affirmed Notes COPD Post-Hospitalization Follow-up Call [1] MILD difficulty breathing (e.g., minimal/no SOB at rest, SOB with walking) AND [2] worse than when discharged from hospital Confusion - Delirium [1] Longstanding confusion (e.g., dementia, stroke) AND [2] worsening Final Disposition User See Physician within 4 Hours (or PCP triage) Ronnald Ramp, RN, Miranda Comments Please note the caller states the pt is not able to use the inhaler because of the Dementia. She does not understand how to use it. Caller would like to discuss Nebulizer. Pt has appt at 2:30 with [facility]. Attempted to  reach Pulmonology office to see if they could see pt instead but the line kept disconnecting. Told caller to go ahead and keep appt at PCP office for now. Referrals REFERRED TO PCP OFFICE Caller Disagree/Comply Comply Caller Understands Yes PreDisposition Call Doctor

## 2017-09-14 DIAGNOSIS — Z9981 Dependence on supplemental oxygen: Secondary | ICD-10-CM | POA: Diagnosis not present

## 2017-09-14 DIAGNOSIS — N200 Calculus of kidney: Secondary | ICD-10-CM | POA: Diagnosis not present

## 2017-09-14 DIAGNOSIS — Z7982 Long term (current) use of aspirin: Secondary | ICD-10-CM | POA: Diagnosis not present

## 2017-09-14 DIAGNOSIS — K5900 Constipation, unspecified: Secondary | ICD-10-CM | POA: Diagnosis not present

## 2017-09-14 DIAGNOSIS — S2241XD Multiple fractures of ribs, right side, subsequent encounter for fracture with routine healing: Secondary | ICD-10-CM | POA: Diagnosis not present

## 2017-09-14 DIAGNOSIS — I10 Essential (primary) hypertension: Secondary | ICD-10-CM | POA: Diagnosis not present

## 2017-09-14 DIAGNOSIS — F0390 Unspecified dementia without behavioral disturbance: Secondary | ICD-10-CM | POA: Diagnosis not present

## 2017-09-14 DIAGNOSIS — Z87891 Personal history of nicotine dependence: Secondary | ICD-10-CM | POA: Diagnosis not present

## 2017-09-14 DIAGNOSIS — M47815 Spondylosis without myelopathy or radiculopathy, thoracolumbar region: Secondary | ICD-10-CM | POA: Diagnosis not present

## 2017-09-14 DIAGNOSIS — J449 Chronic obstructive pulmonary disease, unspecified: Secondary | ICD-10-CM | POA: Diagnosis not present

## 2017-09-15 ENCOUNTER — Telehealth: Payer: Self-pay | Admitting: Nurse Practitioner

## 2017-09-15 NOTE — Telephone Encounter (Signed)
Left vm for jerome to call back, need to ask question below.

## 2017-09-15 NOTE — Telephone Encounter (Signed)
-----   Message from Flossie Buffy, NP sent at 09/15/2017  1:08 PM EDT ----- Please inquire from patient's son if they are interested in home PT and OT.

## 2017-09-15 NOTE — Addendum Note (Signed)
Addended by: Wilfred Lacy L on: 09/15/2017 03:43 PM   Modules accepted: Orders

## 2017-09-15 NOTE — Telephone Encounter (Signed)
Samantha Zamora would like this done. Please advise

## 2017-09-18 NOTE — Telephone Encounter (Signed)
[name] called back. He stated they do want want the PT&OT. He states call him back on : [phone]

## 2017-09-18 NOTE — Telephone Encounter (Signed)
Spoke with daughter in [employer]. She is aware of massage below.

## 2017-09-18 NOTE — Telephone Encounter (Signed)
Left vm for jerome to call back, need to inform him that home health referral is place, [facility] will send paper work the company we use and they will contact the pt.

## 2017-09-25 ENCOUNTER — Telehealth: Payer: Self-pay | Admitting: Nurse Practitioner

## 2017-09-25 DIAGNOSIS — J449 Chronic obstructive pulmonary disease, unspecified: Secondary | ICD-10-CM | POA: Diagnosis not present

## 2017-09-25 DIAGNOSIS — K5900 Constipation, unspecified: Secondary | ICD-10-CM | POA: Diagnosis not present

## 2017-09-25 DIAGNOSIS — I10 Essential (primary) hypertension: Secondary | ICD-10-CM | POA: Diagnosis not present

## 2017-09-25 DIAGNOSIS — S2241XD Multiple fractures of ribs, right side, subsequent encounter for fracture with routine healing: Secondary | ICD-10-CM | POA: Diagnosis not present

## 2017-09-25 DIAGNOSIS — N200 Calculus of kidney: Secondary | ICD-10-CM | POA: Diagnosis not present

## 2017-09-25 DIAGNOSIS — F0390 Unspecified dementia without behavioral disturbance: Secondary | ICD-10-CM | POA: Diagnosis not present

## 2017-09-25 NOTE — Telephone Encounter (Signed)
ok 

## 2017-09-25 NOTE — Telephone Encounter (Signed)
Samantha Zamora from [facility] called requesting verbal orders to be able to continue skilled nursing services. She said that they will also be going PT but are waiting on an evaluation.

## 2017-09-26 DIAGNOSIS — J449 Chronic obstructive pulmonary disease, unspecified: Secondary | ICD-10-CM | POA: Diagnosis not present

## 2017-09-26 DIAGNOSIS — K5900 Constipation, unspecified: Secondary | ICD-10-CM | POA: Diagnosis not present

## 2017-09-26 DIAGNOSIS — F0390 Unspecified dementia without behavioral disturbance: Secondary | ICD-10-CM | POA: Diagnosis not present

## 2017-09-26 DIAGNOSIS — S2241XD Multiple fractures of ribs, right side, subsequent encounter for fracture with routine healing: Secondary | ICD-10-CM | POA: Diagnosis not present

## 2017-09-26 DIAGNOSIS — N200 Calculus of kidney: Secondary | ICD-10-CM | POA: Diagnosis not present

## 2017-09-26 DIAGNOSIS — I10 Essential (primary) hypertension: Secondary | ICD-10-CM | POA: Diagnosis not present

## 2017-09-26 NOTE — Telephone Encounter (Signed)
LM for someone to call back.

## 2017-09-27 ENCOUNTER — Telehealth: Payer: Self-pay | Admitting: Nurse Practitioner

## 2017-09-27 ENCOUNTER — Ambulatory Visit: Payer: Medicare Other | Admitting: Neurology

## 2017-09-27 DIAGNOSIS — I10 Essential (primary) hypertension: Secondary | ICD-10-CM | POA: Diagnosis not present

## 2017-09-27 DIAGNOSIS — S2241XD Multiple fractures of ribs, right side, subsequent encounter for fracture with routine healing: Secondary | ICD-10-CM | POA: Diagnosis not present

## 2017-09-27 DIAGNOSIS — F0390 Unspecified dementia without behavioral disturbance: Secondary | ICD-10-CM | POA: Diagnosis not present

## 2017-09-27 DIAGNOSIS — K5900 Constipation, unspecified: Secondary | ICD-10-CM | POA: Diagnosis not present

## 2017-09-27 DIAGNOSIS — N200 Calculus of kidney: Secondary | ICD-10-CM | POA: Diagnosis not present

## 2017-09-27 DIAGNOSIS — J449 Chronic obstructive pulmonary disease, unspecified: Secondary | ICD-10-CM | POA: Diagnosis not present

## 2017-09-27 NOTE — Telephone Encounter (Signed)
Needs verbals for physical therapy for 1week1 and 2week3

## 2017-09-27 NOTE — Telephone Encounter (Signed)
Accidentally routed to me, please advise

## 2017-09-27 NOTE — Telephone Encounter (Signed)
ok 

## 2017-09-27 NOTE — Telephone Encounter (Signed)
Called Kendra no answer LMOM w/[place] response...Johny Chess

## 2017-09-29 DIAGNOSIS — I10 Essential (primary) hypertension: Secondary | ICD-10-CM | POA: Diagnosis not present

## 2017-09-29 DIAGNOSIS — K5900 Constipation, unspecified: Secondary | ICD-10-CM | POA: Diagnosis not present

## 2017-09-29 DIAGNOSIS — N200 Calculus of kidney: Secondary | ICD-10-CM | POA: Diagnosis not present

## 2017-09-29 DIAGNOSIS — J449 Chronic obstructive pulmonary disease, unspecified: Secondary | ICD-10-CM | POA: Diagnosis not present

## 2017-09-29 DIAGNOSIS — S2241XD Multiple fractures of ribs, right side, subsequent encounter for fracture with routine healing: Secondary | ICD-10-CM | POA: Diagnosis not present

## 2017-09-29 DIAGNOSIS — F0390 Unspecified dementia without behavioral disturbance: Secondary | ICD-10-CM | POA: Diagnosis not present

## 2017-10-02 ENCOUNTER — Ambulatory Visit (INDEPENDENT_AMBULATORY_CARE_PROVIDER_SITE_OTHER): Payer: Medicare Other | Admitting: Neurology

## 2017-10-02 ENCOUNTER — Telehealth: Payer: Self-pay | Admitting: Nurse Practitioner

## 2017-10-02 ENCOUNTER — Ambulatory Visit: Payer: TRICARE For Life (TFL) | Admitting: Neurology

## 2017-10-02 ENCOUNTER — Encounter: Payer: Self-pay | Admitting: Neurology

## 2017-10-02 VITALS — BP 124/46 | HR 39 | Ht 60.0 in | Wt 110.0 lb

## 2017-10-02 DIAGNOSIS — F0391 Unspecified dementia with behavioral disturbance: Secondary | ICD-10-CM

## 2017-10-02 DIAGNOSIS — F03B18 Unspecified dementia, moderate, with other behavioral disturbance: Secondary | ICD-10-CM

## 2017-10-02 MED ORDER — TRAZODONE HCL 50 MG PO TABS: ORAL_TABLET | ORAL | 6 refills | Status: DC

## 2017-10-02 NOTE — Telephone Encounter (Signed)
Verbal orders; Nursing orders to re evaluate for home health.   [place]
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
## 2017-10-02 NOTE — Telephone Encounter (Signed)
ok 

## 2017-10-02 NOTE — Patient Instructions (Signed)
1. Wean off Remeron: Take 1/2 tablet at night for 2 days, then stop 2. Once off Remeron, start Trazodone 50mg : Take 1/2 tablet at night for a week, then increase to 1 tablet at night 3. Continue trying to keep on a routine with day/night cycle, keep her more active during the day 4. Follow-up in 4-5 months, call for any changes

## 2017-10-03 ENCOUNTER — Ambulatory Visit: Payer: Medicare Other | Admitting: Adult Health

## 2017-10-03 DIAGNOSIS — K5900 Constipation, unspecified: Secondary | ICD-10-CM | POA: Diagnosis not present

## 2017-10-03 DIAGNOSIS — S2241XD Multiple fractures of ribs, right side, subsequent encounter for fracture with routine healing: Secondary | ICD-10-CM | POA: Diagnosis not present

## 2017-10-03 DIAGNOSIS — I10 Essential (primary) hypertension: Secondary | ICD-10-CM | POA: Diagnosis not present

## 2017-10-03 DIAGNOSIS — F0390 Unspecified dementia without behavioral disturbance: Secondary | ICD-10-CM | POA: Diagnosis not present

## 2017-10-03 DIAGNOSIS — J449 Chronic obstructive pulmonary disease, unspecified: Secondary | ICD-10-CM | POA: Diagnosis not present

## 2017-10-03 DIAGNOSIS — N200 Calculus of kidney: Secondary | ICD-10-CM | POA: Diagnosis not present

## 2017-10-03 NOTE — Telephone Encounter (Signed)
Spoke with hilary, she is aware.

## 2017-10-04 ENCOUNTER — Encounter: Payer: Self-pay | Admitting: Neurology

## 2017-10-05 DIAGNOSIS — S2241XD Multiple fractures of ribs, right side, subsequent encounter for fracture with routine healing: Secondary | ICD-10-CM | POA: Diagnosis not present

## 2017-10-05 DIAGNOSIS — J449 Chronic obstructive pulmonary disease, unspecified: Secondary | ICD-10-CM | POA: Diagnosis not present

## 2017-10-05 DIAGNOSIS — N200 Calculus of kidney: Secondary | ICD-10-CM | POA: Diagnosis not present

## 2017-10-05 DIAGNOSIS — F0390 Unspecified dementia without behavioral disturbance: Secondary | ICD-10-CM | POA: Diagnosis not present

## 2017-10-05 DIAGNOSIS — I10 Essential (primary) hypertension: Secondary | ICD-10-CM | POA: Diagnosis not present

## 2017-10-05 DIAGNOSIS — K5900 Constipation, unspecified: Secondary | ICD-10-CM | POA: Diagnosis not present

## 2017-10-06 ENCOUNTER — Telehealth: Payer: Self-pay | Admitting: Nurse Practitioner

## 2017-10-06 NOTE — Telephone Encounter (Signed)
Please have patient return to office for demonstration of inhaler use.

## 2017-10-06 NOTE — Telephone Encounter (Signed)
Requesting to continue to monitor patient weekly.  Has small pressure wound that is healing.  Patient is on Trelegy.  Patient can not remember how to use this.  Is there another inhaler patient can recommend that patient might have a little more time with to inhale?   Can leave VM when calling back.

## 2017-10-06 NOTE — Telephone Encounter (Signed)
Please advise. Nurse from [facility] call.

## 2017-10-09 NOTE — Telephone Encounter (Signed)
Left vm for Wells Guiles to call back, need to inform her [name] is [place] with continue care and need the pt to come back for nurse visit for teaching use of inhaler.

## 2017-10-10 DIAGNOSIS — J449 Chronic obstructive pulmonary disease, unspecified: Secondary | ICD-10-CM | POA: Diagnosis not present

## 2017-10-10 DIAGNOSIS — F0390 Unspecified dementia without behavioral disturbance: Secondary | ICD-10-CM | POA: Diagnosis not present

## 2017-10-10 DIAGNOSIS — N200 Calculus of kidney: Secondary | ICD-10-CM | POA: Diagnosis not present

## 2017-10-10 DIAGNOSIS — S2241XD Multiple fractures of ribs, right side, subsequent encounter for fracture with routine healing: Secondary | ICD-10-CM | POA: Diagnosis not present

## 2017-10-10 DIAGNOSIS — K5900 Constipation, unspecified: Secondary | ICD-10-CM | POA: Diagnosis not present

## 2017-10-10 DIAGNOSIS — I10 Essential (primary) hypertension: Secondary | ICD-10-CM | POA: Diagnosis not present

## 2017-10-10 NOTE — Telephone Encounter (Signed)
Notified patient.

## 2017-10-13 DIAGNOSIS — J449 Chronic obstructive pulmonary disease, unspecified: Secondary | ICD-10-CM | POA: Diagnosis not present

## 2017-10-13 DIAGNOSIS — S2241XD Multiple fractures of ribs, right side, subsequent encounter for fracture with routine healing: Secondary | ICD-10-CM | POA: Diagnosis not present

## 2017-10-13 DIAGNOSIS — I10 Essential (primary) hypertension: Secondary | ICD-10-CM | POA: Diagnosis not present

## 2017-10-13 DIAGNOSIS — N200 Calculus of kidney: Secondary | ICD-10-CM | POA: Diagnosis not present

## 2017-10-13 DIAGNOSIS — K5900 Constipation, unspecified: Secondary | ICD-10-CM | POA: Diagnosis not present

## 2017-10-13 DIAGNOSIS — F0390 Unspecified dementia without behavioral disturbance: Secondary | ICD-10-CM | POA: Diagnosis not present

## 2017-10-16 DIAGNOSIS — S2241XD Multiple fractures of ribs, right side, subsequent encounter for fracture with routine healing: Secondary | ICD-10-CM | POA: Diagnosis not present

## 2017-10-16 DIAGNOSIS — F0390 Unspecified dementia without behavioral disturbance: Secondary | ICD-10-CM | POA: Diagnosis not present

## 2017-10-16 DIAGNOSIS — J449 Chronic obstructive pulmonary disease, unspecified: Secondary | ICD-10-CM | POA: Diagnosis not present

## 2017-10-16 DIAGNOSIS — K5900 Constipation, unspecified: Secondary | ICD-10-CM | POA: Diagnosis not present

## 2017-10-16 DIAGNOSIS — N200 Calculus of kidney: Secondary | ICD-10-CM | POA: Diagnosis not present

## 2017-10-16 DIAGNOSIS — I10 Essential (primary) hypertension: Secondary | ICD-10-CM | POA: Diagnosis not present

## 2017-10-23 DIAGNOSIS — J449 Chronic obstructive pulmonary disease, unspecified: Secondary | ICD-10-CM | POA: Diagnosis not present

## 2017-10-23 DIAGNOSIS — K5900 Constipation, unspecified: Secondary | ICD-10-CM | POA: Diagnosis not present

## 2017-10-23 DIAGNOSIS — N200 Calculus of kidney: Secondary | ICD-10-CM | POA: Diagnosis not present

## 2017-10-23 DIAGNOSIS — F0390 Unspecified dementia without behavioral disturbance: Secondary | ICD-10-CM | POA: Diagnosis not present

## 2017-10-23 DIAGNOSIS — S2241XD Multiple fractures of ribs, right side, subsequent encounter for fracture with routine healing: Secondary | ICD-10-CM | POA: Diagnosis not present

## 2017-10-23 DIAGNOSIS — I10 Essential (primary) hypertension: Secondary | ICD-10-CM | POA: Diagnosis not present

## 2017-10-25 ENCOUNTER — Telehealth: Payer: Self-pay | Admitting: Nurse Practitioner

## 2017-10-25 NOTE — Telephone Encounter (Signed)
See message below °

## 2017-10-25 NOTE — Telephone Encounter (Signed)
Samantha Zamora is aware.

## 2017-10-25 NOTE — Telephone Encounter (Signed)
Needs order for social worker evaluation.

## 2017-10-25 NOTE — Telephone Encounter (Signed)
Left vm for Mitchelle to call back, need to give an [place] from [place]. CRM  Copied from [place]. Topic: General - Other >> Oct 24, 2017 10:29 AM Lennox Solders wrote: Reason for CRM: [facility] Coll[facility] is requesting verbal order for social worker evaluation  to assistant with long term care .

## 2017-10-26 DIAGNOSIS — I10 Essential (primary) hypertension: Secondary | ICD-10-CM | POA: Diagnosis not present

## 2017-10-26 DIAGNOSIS — F0390 Unspecified dementia without behavioral disturbance: Secondary | ICD-10-CM | POA: Diagnosis not present

## 2017-10-26 DIAGNOSIS — K5900 Constipation, unspecified: Secondary | ICD-10-CM | POA: Diagnosis not present

## 2017-10-26 DIAGNOSIS — S2241XD Multiple fractures of ribs, right side, subsequent encounter for fracture with routine healing: Secondary | ICD-10-CM | POA: Diagnosis not present

## 2017-10-26 DIAGNOSIS — J449 Chronic obstructive pulmonary disease, unspecified: Secondary | ICD-10-CM | POA: Diagnosis not present

## 2017-10-26 DIAGNOSIS — N200 Calculus of kidney: Secondary | ICD-10-CM | POA: Diagnosis not present

## 2017-10-30 DIAGNOSIS — N200 Calculus of kidney: Secondary | ICD-10-CM | POA: Diagnosis not present

## 2017-10-30 DIAGNOSIS — J449 Chronic obstructive pulmonary disease, unspecified: Secondary | ICD-10-CM | POA: Diagnosis not present

## 2017-10-30 DIAGNOSIS — K5900 Constipation, unspecified: Secondary | ICD-10-CM | POA: Diagnosis not present

## 2017-10-30 DIAGNOSIS — S2241XD Multiple fractures of ribs, right side, subsequent encounter for fracture with routine healing: Secondary | ICD-10-CM | POA: Diagnosis not present

## 2017-10-30 DIAGNOSIS — F0390 Unspecified dementia without behavioral disturbance: Secondary | ICD-10-CM | POA: Diagnosis not present

## 2017-10-30 DIAGNOSIS — I10 Essential (primary) hypertension: Secondary | ICD-10-CM | POA: Diagnosis not present

## 2017-11-03 ENCOUNTER — Telehealth: Payer: Self-pay | Admitting: Nurse Practitioner

## 2017-11-03 NOTE — Telephone Encounter (Signed)
Copied from [place]. Topic: Quick Communication - See Telephone Encounter >> Nov 03, 2017  4:02 PM Burnis Medin, NT wrote: CRM for notification. See Telephone encounter for: Sharyn Lull is calling from [facility] because nursing is calling for verbal orders to continue care for 5 weeks. Social work will continue services until adult daycare is arranged.  11/03/17.

## 2017-11-03 NOTE — Telephone Encounter (Signed)
Sent to wrong pool,  Please route accordingly

## 2017-12-11 DIAGNOSIS — F419 Anxiety disorder, unspecified: Secondary | ICD-10-CM | POA: Diagnosis not present

## 2017-12-11 DIAGNOSIS — S2241XD Multiple fractures of ribs, right side, subsequent encounter for fracture with routine healing: Secondary | ICD-10-CM | POA: Diagnosis not present

## 2017-12-11 DIAGNOSIS — Z9981 Dependence on supplemental oxygen: Secondary | ICD-10-CM | POA: Diagnosis not present

## 2017-12-11 DIAGNOSIS — J449 Chronic obstructive pulmonary disease, unspecified: Secondary | ICD-10-CM | POA: Diagnosis not present

## 2017-12-11 DIAGNOSIS — F329 Major depressive disorder, single episode, unspecified: Secondary | ICD-10-CM | POA: Diagnosis not present

## 2017-12-11 DIAGNOSIS — F0390 Unspecified dementia without behavioral disturbance: Secondary | ICD-10-CM | POA: Diagnosis not present

## 2017-12-11 DIAGNOSIS — M47815 Spondylosis without myelopathy or radiculopathy, thoracolumbar region: Secondary | ICD-10-CM | POA: Diagnosis not present

## 2017-12-11 DIAGNOSIS — N200 Calculus of kidney: Secondary | ICD-10-CM | POA: Diagnosis not present

## 2017-12-11 DIAGNOSIS — I10 Essential (primary) hypertension: Secondary | ICD-10-CM | POA: Diagnosis not present

## 2017-12-11 DIAGNOSIS — Z8701 Personal history of pneumonia (recurrent): Secondary | ICD-10-CM | POA: Diagnosis not present

## 2017-12-11 DIAGNOSIS — Z7982 Long term (current) use of aspirin: Secondary | ICD-10-CM | POA: Diagnosis not present

## 2017-12-11 DIAGNOSIS — Z87891 Personal history of nicotine dependence: Secondary | ICD-10-CM | POA: Diagnosis not present

## 2017-12-26 ENCOUNTER — Other Ambulatory Visit: Payer: Self-pay | Admitting: Nurse Practitioner

## 2018-01-08 ENCOUNTER — Ambulatory Visit: Payer: Medicare Other | Admitting: Neurology

## 2018-01-13 ENCOUNTER — Emergency Department (HOSPITAL_COMMUNITY)
Admission: EM | Admit: 2018-01-13 | Discharge: 2018-01-13 | Disposition: A | Discharge status: Home / Self Care | Payer: Medicare Other | Source: Home / Self Care | Attending: Emergency Medicine | Admitting: Emergency Medicine

## 2018-01-13 ENCOUNTER — Encounter (HOSPITAL_COMMUNITY): Payer: Self-pay | Admitting: Internal Medicine

## 2018-01-13 ENCOUNTER — Emergency Department (HOSPITAL_COMMUNITY): Payer: Medicare Other

## 2018-01-13 DIAGNOSIS — I10 Essential (primary) hypertension: Secondary | ICD-10-CM

## 2018-01-13 DIAGNOSIS — Z85038 Personal history of other malignant neoplasm of large intestine: Secondary | ICD-10-CM

## 2018-01-13 DIAGNOSIS — Z87891 Personal history of nicotine dependence: Secondary | ICD-10-CM

## 2018-01-13 DIAGNOSIS — J9621 Acute and chronic respiratory failure with hypoxia: Secondary | ICD-10-CM | POA: Diagnosis not present

## 2018-01-13 DIAGNOSIS — J181 Lobar pneumonia, unspecified organism: Secondary | ICD-10-CM | POA: Diagnosis not present

## 2018-01-13 DIAGNOSIS — A419 Sepsis, unspecified organism: Secondary | ICD-10-CM | POA: Diagnosis not present

## 2018-01-13 DIAGNOSIS — J101 Influenza due to other identified influenza virus with other respiratory manifestations: Secondary | ICD-10-CM | POA: Insufficient documentation

## 2018-01-13 DIAGNOSIS — F0390 Unspecified dementia without behavioral disturbance: Secondary | ICD-10-CM | POA: Insufficient documentation

## 2018-01-13 DIAGNOSIS — R0902 Hypoxemia: Secondary | ICD-10-CM | POA: Diagnosis not present

## 2018-01-13 DIAGNOSIS — Z7982 Long term (current) use of aspirin: Secondary | ICD-10-CM | POA: Insufficient documentation

## 2018-01-13 DIAGNOSIS — Z79899 Other long term (current) drug therapy: Secondary | ICD-10-CM

## 2018-01-13 DIAGNOSIS — R0602 Shortness of breath: Secondary | ICD-10-CM | POA: Diagnosis not present

## 2018-01-13 DIAGNOSIS — E119 Type 2 diabetes mellitus without complications: Secondary | ICD-10-CM

## 2018-01-13 DIAGNOSIS — Z66 Do not resuscitate: Secondary | ICD-10-CM | POA: Diagnosis not present

## 2018-01-13 DIAGNOSIS — J1008 Influenza due to other identified influenza virus with other specified pneumonia: Secondary | ICD-10-CM | POA: Diagnosis not present

## 2018-01-13 DIAGNOSIS — G9341 Metabolic encephalopathy: Secondary | ICD-10-CM | POA: Diagnosis not present

## 2018-01-13 DIAGNOSIS — J449 Chronic obstructive pulmonary disease, unspecified: Secondary | ICD-10-CM | POA: Insufficient documentation

## 2018-01-13 DIAGNOSIS — N390 Urinary tract infection, site not specified: Secondary | ICD-10-CM | POA: Diagnosis not present

## 2018-01-13 LAB — CBC WITH DIFFERENTIAL/PLATELET
BASOS PCT: 0 %
Basophils Absolute: 0 10*3/uL (ref 0.0–0.1)
Eosinophils Absolute: 0 10*3/uL (ref 0.0–0.7)
Eosinophils Relative: 0 %
HEMATOCRIT: 35.5 % — AB (ref 36.0–46.0)
HEMOGLOBIN: 10.7 g/dL — AB (ref 12.0–15.0)
LYMPHS ABS: 0.9 10*3/uL (ref 0.7–4.0)
Lymphocytes Relative: 14 %
MCH: 28.5 pg (ref 26.0–34.0)
MCHC: 30.1 g/dL (ref 30.0–36.0)
MCV: 94.4 fL (ref 78.0–100.0)
MONOS PCT: 8 %
Monocytes Absolute: 0.5 10*3/uL (ref 0.1–1.0)
NEUTROS ABS: 5.1 10*3/uL (ref 1.7–7.7)
NEUTROS PCT: 78 %
Platelets: 151 10*3/uL (ref 150–400)
RBC: 3.76 MIL/uL — AB (ref 3.87–5.11)
RDW: 13 % (ref 11.5–15.5)
WBC: 6.6 10*3/uL (ref 4.0–10.5)

## 2018-01-13 LAB — BASIC METABOLIC PANEL
Anion gap: 6 (ref 5–15)
BUN: 11 mg/dL (ref 6–20)
CHLORIDE: 92 mmol/L — AB (ref 101–111)
CO2: 38 mmol/L — AB (ref 22–32)
CREATININE: 0.64 mg/dL (ref 0.44–1.00)
Calcium: 9.2 mg/dL (ref 8.9–10.3)
GFR calc non Af Amer: >60 mL/min (ref >60)
Glucose, Bld: 119 mg/dL — ABNORMAL HIGH (ref 65–99)
POTASSIUM: 4.2 mmol/L (ref 3.5–5.1)
Sodium: 136 mmol/L (ref 135–145)

## 2018-01-13 LAB — URINALYSIS, ROUTINE W REFLEX MICROSCOPIC
BILIRUBIN URINE: NEGATIVE
Glucose, UA: NEGATIVE mg/dL
KETONES UR: NEGATIVE mg/dL
NITRITE: NEGATIVE
PROTEIN: 30 mg/dL — AB
SPECIFIC GRAVITY, URINE: 1.011 (ref 1.005–1.030)
pH: 9 — ABNORMAL HIGH (ref 5.0–8.0)

## 2018-01-13 LAB — INFLUENZA PANEL BY PCR (TYPE A & B)
INFLAPCR: POSITIVE — AB
Influenza B By PCR: NEGATIVE

## 2018-01-13 LAB — I-STAT CG4 LACTIC ACID, ED: Lactic Acid, Venous: 0.52 mmol/L (ref 0.5–1.9)

## 2018-01-13 MED ORDER — ACETAMINOPHEN 500 MG PO TABS
1000.0000 mg | ORAL_TABLET | Freq: Once | ORAL | Status: AC
  Administered 2018-01-13: 1000 mg via ORAL
  Filled 2018-01-13: qty 2

## 2018-01-13 MED ORDER — OSELTAMIVIR PHOSPHATE 75 MG PO CAPS: 75.0000 mg | ORAL_CAPSULE | Freq: Two times a day (BID) | ORAL | 0 refills | Status: DC

## 2018-01-13 MED ORDER — CEPHALEXIN 500 MG PO CAPS: 500.0000 mg | ORAL_CAPSULE | Freq: Two times a day (BID) | ORAL | 0 refills | Status: DC

## 2018-01-13 MED ORDER — OSELTAMIVIR PHOSPHATE 75 MG PO CAPS
75.0000 mg | ORAL_CAPSULE | Freq: Once | ORAL | Status: AC
  Administered 2018-01-13: 75 mg via ORAL
  Filled 2018-01-13: qty 1

## 2018-01-13 MED ORDER — SODIUM CHLORIDE 0.9 % IV BOLUS (SEPSIS)
1000.0000 mL | Freq: Once | INTRAVENOUS | Status: AC
  Administered 2018-01-13: 1000 mL via INTRAVENOUS

## 2018-01-13 MED ORDER — CEPHALEXIN 500 MG PO CAPS
500.0000 mg | ORAL_CAPSULE | Freq: Once | ORAL | Status: AC
  Administered 2018-01-13: 500 mg via ORAL
  Filled 2018-01-13: qty 1

## 2018-01-13 MED ORDER — ONDANSETRON 4 MG PO TBDP: 4.0000 mg | ORAL_TABLET | Freq: Three times a day (TID) | ORAL | 0 refills | Status: DC | PRN

## 2018-01-13 NOTE — ED Notes (Signed)
Patient unable to answer questions for this Probation officer.

## 2018-01-13 NOTE — ED Provider Notes (Signed)
[facility] DEPT Provider Note   CSN: [id number] Arrival date & time: 01/13/18  1314     History   Chief Complaint Chief Complaint  Patient presents with  . Altered Mental Status    HPI Samantha Zamora is a 84 y.o. female.  HPI   Samantha Zamora is a 84 year old female with a history of COPD (on 3.5 L nasal cannula at home), dementia, type 2 diabetes, hypertension, osteoporosis who presents to the emergency department for evaluation of disorientation and cough.  Level 5 caveat applies given patient's dementia.  Per patient's son at bedside who is her power of attorney she has had a wet cough for the past 3 days now.  Had a tactile fever last night.  This morning when she woke up she seemed disoriented and was talking to people who have been dead for several years.  He also states that she seems to be going to the bathroom more often.  She lives with him and his wife and uses a walker for ambulation.  This morning while in the bathroom she seemed unsteady.  Her son is concerned given she had similar symptoms where she was admitted to the hospital several months ago for pneumonia.  He also states her appetite has been reduced and she has not wanted to eat today.  Past Medical History:  Diagnosis Date  . Allergic rhinitis   . COPD (chronic obstructive pulmonary disease) ([place])    O2 dependent  . Dementia    early stage  . DM (diabetes mellitus) ([place])   . Eczema   . GERD (gastroesophageal reflux disease)   . HTN (hypertension), benign   . Hx of colon cancer, stage I   . Osteoporosis     Patient Active Problem List   Diagnosis Date Noted  . HCAP (healthcare-associated pneumonia) 09/06/2017  . Mild dementia 03/31/2017  . Anxiety 03/31/2017  . Depression 03/31/2017  . Chronic rhinitis 03/24/2017  . Transient alteration of awareness 03/24/2017  . 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 COPD (chronic obstructive pulmonary disease) ([place]) 03/20/2017    Past  Surgical History:  Procedure Laterality Date  . ABDOMINAL HYSTERECTOMY  1975  . COLON SURGERY  01/2010  . UMBILICAL HERNIA REPAIR  01/2010    OB History    No data available       Home Medications    Prior to Admission medications   Medication Sig Start Date End Date Taking? Authorizing Provider  aspirin EC 81 MG tablet Take 81 mg by mouth daily.    [provider]  Fluticasone-Umeclidin-Vilant (TRELEGY ELLIPTA) 100-62.5-25 MCG/INH AEPB Inhale 1 Inhaler into the lungs daily. 06/09/17   Marshell Garfinkel, MD  traZODone (DESYREL) 50 MG tablet Take 1/2 tablet at night for a week, then increase to 1 tablet at night 10/02/17   Cameron Sprang, MD    Family History Family History  Family history unknown: Yes    Social History Social History   Tobacco Use  . Smoking status: Former Smoker    Packs/day: 2.00    Years: 30.00    Pack years: 60.00  . Smokeless tobacco: Never Used  . Tobacco comment: quit smoking over 50 years ago  Substance Use Topics  . Alcohol use: No  . Drug use: No     Allergies   Ibuprofen   Review of Systems Review of Systems  Unable to perform ROS: Dementia     Physical Exam Updated Vital Signs BP 133/65   Pulse 98  Temp 99 F (37.2 C) (Oral)   Resp 18   SpO2 100%   Physical Exam  Constitutional: She appears well-developed and well-nourished. No distress.  HENT:  Head: Normocephalic and atraumatic.  Mouth/Throat: Oropharynx is clear and moist. No oropharyngeal exudate.  Mucous membranes moist  Eyes: Conjunctivae are normal. Pupils are equal, round, and reactive to light. Right eye exhibits no discharge. Left eye exhibits no discharge.  Neck: Normal range of motion. Neck supple.  Cardiovascular: Normal rate, regular rhythm and intact distal pulses. Exam reveals no friction rub.  Murmur (systolic grade 3/6) heard. Pulmonary/Chest: Effort normal. No respiratory distress.  Patient without respiratory distress. O2 sat 100% on 3L  [place]. Expiratory rhonchi in bilateral upper lung fields. No wheezing or rales.    Abdominal: Soft. Bowel sounds are normal. There is no tenderness. There is no guarding.  Musculoskeletal: Normal range of motion.  Neurological: She is alert. Coordination normal.  Oriented to person and place. Disoriented to time. Able to follow one step commands. No aphasia or facial droop. Grip strength 5/5 bilaterally, strength 5/5 in bilateral ankle dorsiflexion and plantar flexion.   Skin: Skin is warm and dry. She is not diaphoretic.  Psychiatric: She has a normal mood and affect. Her behavior is normal.  Nursing note and vitals reviewed.    ED Treatments / Results  Labs (all labs ordered are listed, but only abnormal results are displayed) Labs Reviewed  CBC WITH DIFFERENTIAL/PLATELET - Abnormal; Notable for the following components:      Result Value   RBC 3.76 (*)    Hemoglobin 10.7 (*)    HCT 35.5 (*)    All other components within normal limits  BASIC METABOLIC PANEL - Abnormal; Notable for the following components:   Chloride 92 (*)    CO2 38 (*)    Glucose, Bld 119 (*)    All other components within normal limits  URINALYSIS, ROUTINE W REFLEX MICROSCOPIC - Abnormal; Notable for the following components:   APPearance CLOUDY (*)    pH 9.0 (*)    Hgb urine dipstick LARGE (*)    Protein, ur 30 (*)    Leukocytes, UA TRACE (*)    Bacteria, UA FEW (*)    Squamous Epithelial / LPF 0-5 (*)    All other components within normal limits  INFLUENZA PANEL BY PCR (TYPE A & B) - Abnormal; Notable for the following components:   Influenza A By PCR POSITIVE (*)    All other components within normal limits  URINE CULTURE  I-STAT CG4 LACTIC ACID, ED    EKG  EKG Interpretation None       Radiology Dg Chest 2 View  Result Date: 01/13/2018 CLINICAL DATA:  Shortness of breath, wheezing and congestion. EXAM: CHEST  2 VIEW COMPARISON:  09/13/2017 FINDINGS: The heart size and mediastinal contours  are within normal limits. Aortic atherosclerosis. Both lungs are clear. Healing right posterior rib deformities are identified. IMPRESSION: 1. No acute cardiopulmonary abnormalities. 2.  Aortic Atherosclerosis (ICD10-I70.0). 3. Healing right posterior rib fractures. Electronically Signed   By: Kerby Moors M.D.   On: 01/13/2018 14:34    Procedures Procedures (including critical care time)  Medications Ordered in ED Medications  acetaminophen (TYLENOL) tablet 1,000 mg (1,000 mg Oral Given 01/13/18 1631)  sodium chloride 0.9 % bolus 1,000 mL (0 mLs Intravenous Stopped 01/13/18 1702)  cephALEXin (KEFLEX) capsule 500 mg (500 mg Oral Given 01/13/18 2005)  oseltamivir (TAMIFLU) capsule 75 mg (75 mg Oral Given 01/13/18  2006)     Initial Impression / Assessment and Plan / ED Course  I have reviewed the triage vital signs and the nursing notes.  Pertinent labs & imaging results that were available during my care of the patient were reviewed by me and considered in my medical decision making (see chart for details).  Presents with her son for evaluation of cough, urinary frequency and disorientation. Has a history of dementia. On exam patient is nontoxic appearing and in no apparent distress.  Oriented to person and place.   On presentation she has a rectal temperature of 101.45F. Otherwise, VSS. No respiratory distress or increased work of breathing. She has 02 sat of 100% on 3L [place] (normally on 3.5L home oxygen.) Abdomen nontender. Rhonchi heard in bilateral upper lobes. No rales or wheezes.   Results reviewed. CXR without acute abnormality. CBC and BMP unremarkable. No lactic acidosis. Influenza positive. UA borderline (RBCs TNTC, Nitrite negative and trace leukocytes.) Will treat with keflex and send for urine culture.   Patient fever resolved with acetaminophen. She is not tachycardic or hypotensive while in the ED. No lactic acidosis. Able to ambulate independently and tolerate PO fluids. Plan to  send patient home with tamiflu as well as keflex for potential UTI.    Discussed return precautions with son who is patient's POA at bedside and he agrees. Have counseled family to follow up with her PCP for recheck next week. Patient given first dose of keflex and tamiflu in the department. This was a shared visit with Dr. Eulis Foster who also saw the patient and agrees with plan.    Final Clinical Impressions(s) / ED Diagnoses   Final diagnoses:  Influenza A    ED Discharge Orders        Ordered    cephALEXin (KEFLEX) 500 MG capsule  2 times daily     01/13/18 1910    oseltamivir (TAMIFLU) 75 MG capsule  Every 12 hours     01/13/18 1910    ondansetron (ZOFRAN ODT) 4 MG disintegrating tablet  Every 8 hours PRN     01/13/18 1910       Bernarda Caffey 01/13/18 2339    Daleen Bo, MD 01/14/18 [phone]

## 2018-01-13 NOTE — Discharge Instructions (Addendum)
Ms. Gens has the flu. Please give her Tamiflu twice a day for the next five days.   She also has borderline UTI. We will treat with antibiotic. Please give her antibiotic (keflex) twice a day for the next week.   Chest Xray and blood work reassuring.   She can take tylenol every 4hrs as needed for fever.   Please schedule apt with her regular doctor next week for recheck.   Please return to the ER if she has trouble breathing, develops vomiting or has any new or worsening symptoms.

## 2018-01-13 NOTE — ED Notes (Signed)
Patient transported to X-ray 

## 2018-01-13 NOTE — ED Notes (Signed)
This RN and nursing student attempted to in and out catheterize patient two times unsuccessfully. Will make Dr. Eulis Foster aware. Patient placed on purwick for the time being for restroom use.

## 2018-01-13 NOTE — ED Provider Notes (Signed)
  Face-to-face evaluation   History: Patient presents for evaluation of disorientation  Physical exam: Alert elderly female who is confused.  No respiratory distress.  Lungs with decreased air movement bilaterally, without wheezing.  Few upper airway rhonchi.  No increased work of breathing.  Medical screening examination/treatment/procedure(s) were conducted as a shared visit with non-physician practitioner(s) and myself.  I personally evaluated the patient during the encounter    Daleen Bo, MD 01/14/18 2143

## 2018-01-13 NOTE — ED Triage Notes (Signed)
Pt arrived via [facility] with altered mental status for 3 days. Per family, patient has had decreased appetite and has not been communicating at baseline. CBG 146 by [facility]. Pt has hx of dementia and is alert to self.

## 2018-01-13 NOTE — ED Notes (Addendum)
RN before me attempted to catheters with no output. Patient has a splotch of blood on pure wick. MD made aware.

## 2018-01-14 ENCOUNTER — Encounter (HOSPITAL_COMMUNITY): Payer: Self-pay

## 2018-01-14 ENCOUNTER — Emergency Department (HOSPITAL_COMMUNITY): Payer: Medicare Other

## 2018-01-14 ENCOUNTER — Inpatient Hospital Stay (HOSPITAL_COMMUNITY)
Admission: EM | Admit: 2018-01-14 | Discharge: 2018-01-19 | DRG: 871 | Disposition: A | Discharge status: Skilled Nursing Facility | Payer: Medicare Other | Attending: Internal Medicine | Admitting: Internal Medicine

## 2018-01-14 ENCOUNTER — Other Ambulatory Visit: Payer: Self-pay

## 2018-01-14 DIAGNOSIS — R4182 Altered mental status, unspecified: Secondary | ICD-10-CM

## 2018-01-14 DIAGNOSIS — M6389 Disorders of muscle in diseases classified elsewhere, multiple sites: Secondary | ICD-10-CM | POA: Diagnosis not present

## 2018-01-14 DIAGNOSIS — I959 Hypotension, unspecified: Secondary | ICD-10-CM

## 2018-01-14 DIAGNOSIS — J440 Chronic obstructive pulmonary disease with acute lower respiratory infection: Secondary | ICD-10-CM | POA: Diagnosis present

## 2018-01-14 DIAGNOSIS — Z66 Do not resuscitate: Secondary | ICD-10-CM | POA: Diagnosis present

## 2018-01-14 DIAGNOSIS — J449 Chronic obstructive pulmonary disease, unspecified: Secondary | ICD-10-CM | POA: Diagnosis present

## 2018-01-14 DIAGNOSIS — A419 Sepsis, unspecified organism: Principal | ICD-10-CM | POA: Diagnosis present

## 2018-01-14 DIAGNOSIS — J1008 Influenza due to other identified influenza virus with other specified pneumonia: Secondary | ICD-10-CM | POA: Diagnosis present

## 2018-01-14 DIAGNOSIS — Z87891 Personal history of nicotine dependence: Secondary | ICD-10-CM

## 2018-01-14 DIAGNOSIS — F419 Anxiety disorder, unspecified: Secondary | ICD-10-CM

## 2018-01-14 DIAGNOSIS — K219 Gastro-esophageal reflux disease without esophagitis: Secondary | ICD-10-CM | POA: Diagnosis present

## 2018-01-14 DIAGNOSIS — R402132 Coma scale, eyes open, to sound, at arrival to emergency department: Secondary | ICD-10-CM | POA: Diagnosis present

## 2018-01-14 DIAGNOSIS — Z9981 Dependence on supplemental oxygen: Secondary | ICD-10-CM | POA: Diagnosis not present

## 2018-01-14 DIAGNOSIS — J441 Chronic obstructive pulmonary disease with (acute) exacerbation: Secondary | ICD-10-CM | POA: Diagnosis present

## 2018-01-14 DIAGNOSIS — F0390 Unspecified dementia without behavioral disturbance: Secondary | ICD-10-CM | POA: Diagnosis present

## 2018-01-14 DIAGNOSIS — J9602 Acute respiratory failure with hypercapnia: Secondary | ICD-10-CM | POA: Diagnosis not present

## 2018-01-14 DIAGNOSIS — E119 Type 2 diabetes mellitus without complications: Secondary | ICD-10-CM | POA: Diagnosis present

## 2018-01-14 DIAGNOSIS — M810 Age-related osteoporosis without current pathological fracture: Secondary | ICD-10-CM | POA: Diagnosis present

## 2018-01-14 DIAGNOSIS — R402352 Coma scale, best motor response, localizes pain, at arrival to emergency department: Secondary | ICD-10-CM | POA: Diagnosis present

## 2018-01-14 DIAGNOSIS — J9601 Acute respiratory failure with hypoxia: Secondary | ICD-10-CM

## 2018-01-14 DIAGNOSIS — R350 Frequency of micturition: Secondary | ICD-10-CM | POA: Diagnosis not present

## 2018-01-14 DIAGNOSIS — R0902 Hypoxemia: Secondary | ICD-10-CM | POA: Diagnosis not present

## 2018-01-14 DIAGNOSIS — Z7982 Long term (current) use of aspirin: Secondary | ICD-10-CM

## 2018-01-14 DIAGNOSIS — J9600 Acute respiratory failure, unspecified whether with hypoxia or hypercapnia: Secondary | ICD-10-CM | POA: Diagnosis not present

## 2018-01-14 DIAGNOSIS — I10 Essential (primary) hypertension: Secondary | ICD-10-CM | POA: Diagnosis present

## 2018-01-14 DIAGNOSIS — Z1611 Resistance to penicillins: Secondary | ICD-10-CM | POA: Diagnosis present

## 2018-01-14 DIAGNOSIS — J9622 Acute and chronic respiratory failure with hypercapnia: Secondary | ICD-10-CM | POA: Diagnosis present

## 2018-01-14 DIAGNOSIS — R41841 Cognitive communication deficit: Secondary | ICD-10-CM | POA: Diagnosis not present

## 2018-01-14 DIAGNOSIS — Z85038 Personal history of other malignant neoplasm of large intestine: Secondary | ICD-10-CM

## 2018-01-14 DIAGNOSIS — J101 Influenza due to other identified influenza virus with other respiratory manifestations: Secondary | ICD-10-CM | POA: Diagnosis not present

## 2018-01-14 DIAGNOSIS — R278 Other lack of coordination: Secondary | ICD-10-CM | POA: Diagnosis not present

## 2018-01-14 DIAGNOSIS — G9341 Metabolic encephalopathy: Secondary | ICD-10-CM | POA: Diagnosis not present

## 2018-01-14 DIAGNOSIS — N390 Urinary tract infection, site not specified: Secondary | ICD-10-CM | POA: Diagnosis present

## 2018-01-14 DIAGNOSIS — J09X2 Influenza due to identified novel influenza A virus with other respiratory manifestations: Secondary | ICD-10-CM | POA: Diagnosis not present

## 2018-01-14 DIAGNOSIS — R011 Cardiac murmur, unspecified: Secondary | ICD-10-CM

## 2018-01-14 DIAGNOSIS — Z79899 Other long term (current) drug therapy: Secondary | ICD-10-CM

## 2018-01-14 DIAGNOSIS — E872 Acidosis: Secondary | ICD-10-CM | POA: Diagnosis not present

## 2018-01-14 DIAGNOSIS — R2681 Unsteadiness on feet: Secondary | ICD-10-CM | POA: Diagnosis not present

## 2018-01-14 DIAGNOSIS — R402242 Coma scale, best verbal response, confused conversation, at arrival to emergency department: Secondary | ICD-10-CM | POA: Diagnosis present

## 2018-01-14 DIAGNOSIS — R0602 Shortness of breath: Secondary | ICD-10-CM | POA: Diagnosis not present

## 2018-01-14 DIAGNOSIS — J209 Acute bronchitis, unspecified: Secondary | ICD-10-CM | POA: Diagnosis not present

## 2018-01-14 DIAGNOSIS — Z886 Allergy status to analgesic agent status: Secondary | ICD-10-CM

## 2018-01-14 DIAGNOSIS — J181 Lobar pneumonia, unspecified organism: Secondary | ICD-10-CM | POA: Diagnosis not present

## 2018-01-14 DIAGNOSIS — F05 Delirium due to known physiological condition: Secondary | ICD-10-CM | POA: Diagnosis present

## 2018-01-14 DIAGNOSIS — J9621 Acute and chronic respiratory failure with hypoxia: Secondary | ICD-10-CM | POA: Diagnosis not present

## 2018-01-14 DIAGNOSIS — J189 Pneumonia, unspecified organism: Secondary | ICD-10-CM | POA: Diagnosis present

## 2018-01-14 DIAGNOSIS — R069 Unspecified abnormalities of breathing: Secondary | ICD-10-CM | POA: Diagnosis not present

## 2018-01-14 DIAGNOSIS — M6281 Muscle weakness (generalized): Secondary | ICD-10-CM | POA: Diagnosis not present

## 2018-01-14 DIAGNOSIS — J80 Acute respiratory distress syndrome: Secondary | ICD-10-CM | POA: Diagnosis not present

## 2018-01-14 LAB — I-STAT ARTERIAL BLOOD GAS, ED
ACID-BASE EXCESS: 5 mmol/L — AB (ref 0.0–2.0)
Acid-Base Excess: 8 mmol/L — ABNORMAL HIGH (ref 0.0–2.0)
Acid-Base Excess: 7 mmol/L — ABNORMAL HIGH (ref 0.0–2.0)
BICARBONATE: 36.5 mmol/L — AB (ref 20.0–28.0)
BICARBONATE: 33.9 mmol/L — AB (ref 20.0–28.0)
Bicarbonate: 38.5 mmol/L — ABNORMAL HIGH (ref 20.0–28.0)
O2 SAT: 94 %
O2 Saturation: 77 %
O2 Saturation: 96 %
PCO2 ART: 73.2 mmHg — AB (ref 32.0–48.0)
PH ART: 7.266 — AB (ref 7.350–7.450)
PO2 ART: 95 mmHg (ref 83.0–108.0)
Patient temperature: 100.2
TCO2: 41 mmol/L — AB (ref 22–32)
TCO2: 39 mmol/L — ABNORMAL HIGH (ref 22–32)
TCO2: 36 mmol/L — ABNORMAL HIGH (ref 22–32)
pCO2 arterial: 85.4 mmHg (ref 32.0–48.0)
pCO2 arterial: 86.3 mmHg (ref 32.0–48.0)
pH, Arterial: 7.234 — ABNORMAL LOW (ref 7.350–7.450)
pH, Arterial: 7.274 — ABNORMAL LOW (ref 7.350–7.450)
pO2, Arterial: 53 mmHg — ABNORMAL LOW (ref 83.0–108.0)
pO2, Arterial: 88 mmHg (ref 83.0–108.0)

## 2018-01-14 LAB — CBC WITH DIFFERENTIAL/PLATELET
Basophils Absolute: 0 10*3/uL (ref 0.0–0.1)
Basophils Relative: 0 %
EOS ABS: 0 10*3/uL (ref 0.0–0.7)
Eosinophils Relative: 0 %
HCT: 38.2 % (ref 36.0–46.0)
Hemoglobin: 11.1 g/dL — ABNORMAL LOW (ref 12.0–15.0)
LYMPHS ABS: 0.5 10*3/uL — AB (ref 0.7–4.0)
LYMPHS PCT: 6 %
MCH: 28 pg (ref 26.0–34.0)
MCHC: 29.1 g/dL — ABNORMAL LOW (ref 30.0–36.0)
MCV: 96.2 fL (ref 78.0–100.0)
Monocytes Absolute: 0.4 10*3/uL (ref 0.1–1.0)
Monocytes Relative: 5 %
Neutro Abs: 7.6 10*3/uL (ref 1.7–7.7)
Neutrophils Relative %: 89 %
Platelets: 128 10*3/uL — ABNORMAL LOW (ref 150–400)
RBC: 3.97 MIL/uL (ref 3.87–5.11)
RDW: 13.4 % (ref 11.5–15.5)
WBC: 8.5 10*3/uL (ref 4.0–10.5)

## 2018-01-14 LAB — LACTIC ACID, PLASMA
LACTIC ACID, VENOUS: 1.8 mmol/L (ref 0.5–1.9)
Lactic Acid, Venous: 1.5 mmol/L (ref 0.5–1.9)
Lactic Acid, Venous: 1.8 mmol/L (ref 0.5–1.9)

## 2018-01-14 LAB — COMPREHENSIVE METABOLIC PANEL
ALT: 20 U/L (ref 14–54)
ANION GAP: 11 (ref 5–15)
AST: 41 U/L (ref 15–41)
Albumin: 3.4 g/dL — ABNORMAL LOW (ref 3.5–5.0)
Alkaline Phosphatase: 68 U/L (ref 38–126)
BUN: 10 mg/dL (ref 6–20)
CHLORIDE: 94 mmol/L — AB (ref 101–111)
CO2: 34 mmol/L — ABNORMAL HIGH (ref 22–32)
CREATININE: 0.7 mg/dL (ref 0.44–1.00)
Calcium: 9.4 mg/dL (ref 8.9–10.3)
Glucose, Bld: 158 mg/dL — ABNORMAL HIGH (ref 65–99)
POTASSIUM: 4.1 mmol/L (ref 3.5–5.1)
Sodium: 139 mmol/L (ref 135–145)
Total Bilirubin: 0.8 mg/dL (ref 0.3–1.2)
Total Protein: 6.4 g/dL — ABNORMAL LOW (ref 6.5–8.1)

## 2018-01-14 LAB — I-STAT CG4 LACTIC ACID, ED
LACTIC ACID, VENOUS: 2.52 mmol/L — AB (ref 0.5–1.9)
LACTIC ACID, VENOUS: 3.35 mmol/L — AB (ref 0.5–1.9)

## 2018-01-14 MED ORDER — ACETAMINOPHEN 650 MG RE SUPP
650.0000 mg | Freq: Once | RECTAL | Status: AC
  Administered 2018-01-14: 650 mg via RECTAL
  Filled 2018-01-14: qty 1

## 2018-01-14 MED ORDER — SODIUM CHLORIDE 0.9 % IV SOLN
1.0000 g | Freq: Once | INTRAVENOUS | Status: AC
  Administered 2018-01-14: 1 g via INTRAVENOUS
  Filled 2018-01-14: qty 10

## 2018-01-14 MED ORDER — ENOXAPARIN SODIUM 30 MG/0.3ML ~~LOC~~ SOLN
30.0000 mg | SUBCUTANEOUS | Status: DC
  Administered 2018-01-15 – 2018-01-18 (×4): 30 mg via SUBCUTANEOUS
  Filled 2018-01-14 (×4): qty 0.3

## 2018-01-14 MED ORDER — ASPIRIN EC 81 MG PO TBEC
81.0000 mg | DELAYED_RELEASE_TABLET | Freq: Every day | ORAL | Status: DC
  Administered 2018-01-15 – 2018-01-19 (×5): 81 mg via ORAL
  Filled 2018-01-14 (×5): qty 1

## 2018-01-14 MED ORDER — IPRATROPIUM-ALBUTEROL 0.5-2.5 (3) MG/3ML IN SOLN
RESPIRATORY_TRACT | Status: AC
  Administered 2018-01-14: 3 mL via RESPIRATORY_TRACT
  Filled 2018-01-14: qty 3

## 2018-01-14 MED ORDER — ENOXAPARIN SODIUM 40 MG/0.4ML ~~LOC~~ SOLN: 40.0000 mg | SUBCUTANEOUS | Status: DC

## 2018-01-14 MED ORDER — SODIUM CHLORIDE 0.9 % IV BOLUS (SEPSIS)
500.0000 mL | Freq: Once | INTRAVENOUS | Status: AC
  Administered 2018-01-14: 500 mL via INTRAVENOUS

## 2018-01-14 MED ORDER — ALBUTEROL SULFATE (2.5 MG/3ML) 0.083% IN NEBU
2.5000 mg | INHALATION_SOLUTION | RESPIRATORY_TRACT | Status: DC | PRN
Start: 2018-01-14 — End: 2018-01-15

## 2018-01-14 MED ORDER — ONDANSETRON HCL 4 MG/2ML IJ SOLN: 4.0000 mg | Freq: Four times a day (QID) | INTRAMUSCULAR | Status: DC | PRN

## 2018-01-14 MED ORDER — ALBUTEROL SULFATE (2.5 MG/3ML) 0.083% IN NEBU: 2.5000 mg | INHALATION_SOLUTION | Freq: Four times a day (QID) | RESPIRATORY_TRACT | Status: DC | PRN

## 2018-01-14 MED ORDER — SODIUM CHLORIDE 0.9 % IV BOLUS (SEPSIS): 500.0000 mL | Freq: Once | INTRAVENOUS | Status: DC

## 2018-01-14 MED ORDER — ONDANSETRON HCL 4 MG PO TABS: 4.0000 mg | ORAL_TABLET | Freq: Four times a day (QID) | ORAL | Status: DC | PRN

## 2018-01-14 MED ORDER — OSELTAMIVIR PHOSPHATE 75 MG PO CAPS: 75.0000 mg | ORAL_CAPSULE | Freq: Once | ORAL | Status: DC

## 2018-01-14 MED ORDER — IPRATROPIUM-ALBUTEROL 0.5-2.5 (3) MG/3ML IN SOLN: 3.0000 mL | RESPIRATORY_TRACT | Status: DC

## 2018-01-14 MED ORDER — OSELTAMIVIR PHOSPHATE 75 MG PO CAPS
75.0000 mg | ORAL_CAPSULE | Freq: Two times a day (BID) | ORAL | Status: DC
  Administered 2018-01-15: 75 mg via ORAL
  Filled 2018-01-14 (×2): qty 1

## 2018-01-14 MED ORDER — IPRATROPIUM-ALBUTEROL 0.5-2.5 (3) MG/3ML IN SOLN
3.0000 mL | Freq: Three times a day (TID) | RESPIRATORY_TRACT | Status: DC
  Administered 2018-01-14 – 2018-01-15 (×3): 3 mL via RESPIRATORY_TRACT
  Filled 2018-01-14 (×2): qty 3

## 2018-01-14 MED ORDER — ACETAMINOPHEN 650 MG RE SUPP: 650.0000 mg | Freq: Four times a day (QID) | RECTAL | Status: DC | PRN

## 2018-01-14 MED ORDER — ACETAMINOPHEN 325 MG PO TABS
650.0000 mg | ORAL_TABLET | Freq: Four times a day (QID) | ORAL | Status: DC | PRN
  Administered 2018-01-17: 650 mg via ORAL
  Filled 2018-01-14: qty 2

## 2018-01-14 MED ORDER — SODIUM CHLORIDE 0.9 % IV SOLN
INTRAVENOUS | Status: AC
  Administered 2018-01-14: 13:00:00 via INTRAVENOUS

## 2018-01-14 MED ORDER — SODIUM CHLORIDE 0.9 % IV BOLUS (SEPSIS)
1000.0000 mL | Freq: Once | INTRAVENOUS | Status: AC
  Administered 2018-01-14: 1000 mL via INTRAVENOUS

## 2018-01-14 NOTE — ED Notes (Signed)
Called for a hospital bed per RN request

## 2018-01-14 NOTE — ED Triage Notes (Signed)
[facility]- pt coming from home with complaint of SOB upon waking up this morning. Seen yesterday at [facility] for AMS. She was diagnosed with the flu. Pt had 10mg  of albuterol, 0.5mg  of atrovent, and 125mg  of solumedrol PTA.

## 2018-01-14 NOTE — ED Notes (Signed)
CRITICAL LACTIC ACID: 2.52 (reported to Dr. Leonette Monarch at [phone])

## 2018-01-14 NOTE — ED Notes (Signed)
Pt called out for restroom assistance, placed on bedpan.

## 2018-01-14 NOTE — ED Provider Notes (Signed)
Medical screening examination/treatment/procedure(s) were conducted as a shared visit with non-physician practitioner(s) and myself.  I personally evaluated the patient during the encounter. Briefly, the patient is a 84 y.o. female with a history of COPD on 3-1/2 L nasal cannula at home who presents to the emergency department with altered mental status.  Patient was seen yesterday at [facility] for the same and found to have positive influenza and a possible urinary tract infection.  She was sent home with Keflex and Tamiflu which she has not taken.  Son reports that she awoke today more confused than yesterday.  No recent trauma.  EMS was called who provided the patient with duo nebs and Solu-Medrol.  On exam patient is disoriented and unable to provide history.  She is satting well on her home 3-1/2 L.  Code sepsis was initiated and patient started on empiric antibiotics.  She is hemodynamically stable and lactic acid is less than 4, thus she does not require 30 cc/kg of IV fluids.  Workup revealed respiratory acidosis with hypercarbia.  Patient placed on BiPAP.  Chest x-ray confirmed likely viral bronchopneumonia.  Patient will be admitted for further workup and management.   EKG Interpretation  Date/Time:  Sunday January 14 2018 08:01:02 EST Ventricular Rate:  110 PR Interval:    QRS Duration: 103 QT Interval:  302 QTC Calculation: 409 R Axis:   93 Text Interpretation:  Sinus or ectopic atrial tachycardia Right axis deviation Borderline repolarization abnormality Artifact in lead(s) I II III aVR aVL aVF V3 V4 V5 V6 Poor data quality Otherwise no significant change Confirmed by Addison Lank [phone]) on 01/14/2018 9:30:32 AM       CRITICAL CARE Performed by: Grayce Sessions Shahzaib Azevedo Total critical care time: 20 minutes Critical care time was exclusive of separately billable procedures and treating other patients. Critical care was necessary to treat or prevent imminent or life-threatening  deterioration. Critical care was time spent personally by me on the following activities: development of treatment plan with patient and/or surrogate as well as nursing, discussions with consultants, evaluation of patient's response to treatment, examination of patient, obtaining history from patient or surrogate, ordering and performing treatments and interventions, ordering and review of laboratory studies, ordering and review of radiographic studies, pulse oximetry and re-evaluation of patient's condition.       Fatima Blank, MD 01/14/18 0930

## 2018-01-14 NOTE — ED Notes (Signed)
Admitting doctor paged to RN per her request °

## 2018-01-14 NOTE — H&P (Signed)
Date: 01/14/2018               Patient Name:  Samantha Zamora MRN: [id number]  DOB: 1924-09-18 Age / Sex: 84 y.o., female   PCP: Nche, [name], NP         Medical Service: Internal Medicine Teaching Service         Attending Physician: Dr. Bartholomew Crews, MD    First Contact: Dr. Johny Chess Pager: [phone]  Second Contact: Dr. Philipp Ovens Pager: [phone]       After Hours (After 5p/  First Contact Pager: [phone]  weekends / holidays): Second Contact Pager: [phone]   Chief Complaint: Influenza, altered mental status   History of Present Illness: Samantha Zamora is a 84 yo F with a past medical history of COPD on home 3.5 L O2, dementia, anxiety who presented to the ED due to altered mental status. History obtained via son and daughter in law and chart review.    She was seen yesterday in the ED with a 1-2 day history of cough, increased disorientation compared to her baseline, fever (reported to measure 102 at home yesterday), and increased urinary frequency. She was found to be positive for Influenza A, U/A questionable. She was stable and prescribed tamiflu and keflex for outpatient treatment. They had not picked up these prescriptions yet and this morning, the pt was much more unresponsive. At baseline, she is conversational, oriented to self and place, dresses, feeds, and washes herself, ambulatory with walker, but does have increased confusion at night for which she takes remeron. On chart review, it appears she has had increased confusion with acute illnesses in the past. Family also notes she has had decreased oral intake during these symptoms.   In the ED, T 100.2, HR 111, BP 138/72, initially on home O2 [place]. ABG showed pH 7.266, pCO2 85.4, HCO3 38.5, she was subsequently placed on BiPap. LA 2.52. Her BP decreased and she received 1 L IVF bolus. Blood cultures drawn, she received oseltamivir and ceftriaxone. She was admitted for further management.       Meds:  Current  Meds  Medication Sig  . aspirin EC 81 MG tablet Take 81 mg by mouth daily.  . mirtazapine (REMERON) 15 MG tablet Take 15 mg by mouth at bedtime.  . OXYGEN Inhale 3 L into the lungs.     Allergies: Allergies as of 01/14/2018 - Review Complete 01/14/2018  Allergen Reaction Noted  . Ibuprofen  06/10/2016   Past Medical History:  Diagnosis Date  . Allergic rhinitis   . COPD (chronic obstructive pulmonary disease) ([place])    O2 dependent  . Dementia    early stage  . DM (diabetes mellitus) ([place])   . Eczema   . GERD (gastroesophageal reflux disease)   . HTN (hypertension), benign   . Hx of colon cancer, stage I   . Osteoporosis     Family History: Unable to be obtained due to patient's mental status.    Social History: Unable to be obtained due to patient's mental status.   Review of Systems: Unable to be obtained due to patient's mental status.  Physical Exam: Blood pressure (!) 102/52, pulse 89, temperature 100.2 F (37.9 C), temperature source Oral, resp. rate 20, weight 110 lb (49.9 kg), SpO2 100 %. General: Frail elderly female resting in bed on BiPAP, no acute distress Head: Normocephalic, atraumatic Eyes: Pinpoint pupils ENT: On BiPAP, detailed portion of exam deferred  CV: Systolic murmur, regular rate and  rhythm  Resp: Limited with pt effort, no wheezing or significant crackles appreciated, does not appear in respiratory distress   Abd: Soft, +BS, no apparent tenderness with palpation Extr: No LE edema  Neuro: Difficult to arouse with periods of variable alertness, oriented to self. Intermittently follows commands, able to move all extremities.  Skin: Warm, dry     EKG: personally reviewed my interpretation is sinus tachycardia with artifact. Right axis deviation. No ischemic changes appreciated.   CXR: personally reviewed my interpretation is hyperexpansion with flattened diaphragms, some hazy prominence to inferior perihilar region, read to be vascular  congestion v viral pna.     Assessment & Plan by Problem:  Influenza A  Confirmed on pcr panel on prior ED visit, re-presenting with worsened mental status and hypoxia as described below. She has been started on oseltamivir. LA is trending up, currently 3.35 and pt was hypotensive at time of exam--additional 500 cc bolus started. Blood cultures have been drawn. CXR showing vascular congestion vs viral bronchopneuminia.  --Monitor vital signs, fever --Continue Oseltamivir 75 mg BID x 5 days total  --CBC, BMP --LA --F/u blood cultures   Hypoxia, H/o COPD On 3.5 L home O2 with increased oxygen demand and hypoxia + resp acidosis on ABG, currently on BiPAP. Breath sounds limited by pt effort but no wheezing appreciated--does not appear to be in acute COPD exacerbation and will hold off on steroid administration at this point. Hypoxia and hypoventilation may be related to flu respiratory sx, as well as mental status. Son, who is healthcare power of attorney, feels DNR/DNI with full scope of tx is appropriate but is contacting sister to confirm. --Cont BiPAP for now --Rpt ABG 1230 --Duo-neb q4hr, albuterol neb q6hr prn    Altered Mental Status, H/o Dementia Pt re-presenting with worsened mental status in the setting of flu. She has a history of mild dementia with sun-downing like behavior, currently on mirtazapine. She has very low physiologic reserve and may be experiencing hypoactive delirium in the setting of an acute infection. She does have pinpoint pupils, though no record of administration of opiates here or as home med. --NPO, monitor mental status  --Hold mirtazapine  --Delirium precautions   Urinary Frequency Reporting increased urinary frequency, no complaints of dysuria per son. Prior U/A with white cells, negative nitrite and few bacteria. Not convincing for infxn but empiric treatment prescribed. She received a dose of ceftriaxone prior to admission for coverage. Will hold off on  further abx and follow previously collected urine culture to guide treatment.  --F/u Urine culture    Dispo: Admit patient to Observation with expected length of stay less than 2 midnights.  Signed: Tawny Asal, MD 01/14/2018, 10:49 AM  Pager: [phone]

## 2018-01-14 NOTE — ED Provider Notes (Signed)
[facility] EMERGENCY DEPARTMENT Provider Note   CSN: [id number] Arrival date & time: 01/14/18  9211     History   Chief Complaint Chief Complaint  Patient presents with  . Respiratory Distress    HPI Christee Mervine is a 84 y.o. female.  HPI   84 year old female with history of COPD, O2 dependent at 3.5 L at home, diabetes, dementia brought here for evaluation of shortness of breath.  History obtained through family member and through prior charts.  Level 5 caveats supplies due to her dementia.  For the past 4 days patient has had cough, fever, and increased confusion.  Also report increased urinary frequency.  She was seen in the ED yesterday for her complaint.  She tested positive for influenza A.  Her labs were reassuring at that time.  She was subsequently discharged with Tamiflu and Keflex. Patient brought back today due to increased confusion, more trouble breathing, and persistent coughing.  Son who is at bedside is the power of attorney.  He confirmed that patient is DNR.  He reportedly have not filled the antibiotic medication yet.  No report of vomiting or diarrhea.  Past Medical History:  Diagnosis Date  . Allergic rhinitis   . COPD (chronic obstructive pulmonary disease) ([place])    O2 dependent  . Dementia    early stage  . DM (diabetes mellitus) ([place])   . Eczema   . GERD (gastroesophageal reflux disease)   . HTN (hypertension), benign   . Hx of colon cancer, stage I   . Osteoporosis     Patient Active Problem List   Diagnosis Date Noted  . HCAP (healthcare-associated pneumonia) 09/06/2017  . Mild dementia 03/31/2017  . Anxiety 03/31/2017  . Depression 03/31/2017  . Chronic rhinitis 03/24/2017  . Transient alteration of awareness 03/24/2017  . 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 COPD (chronic obstructive pulmonary disease) ([place]) 03/20/2017    Past Surgical History:  Procedure Laterality Date  . ABDOMINAL HYSTERECTOMY  1975  . COLON SURGERY   01/2010  . UMBILICAL HERNIA REPAIR  01/2010    OB History    No data available       Home Medications    Prior to Admission medications   Medication Sig Start Date End Date Taking? Authorizing Provider  aspirin EC 81 MG tablet Take 81 mg by mouth daily.    [provider]  cephALEXin (KEFLEX) 500 MG capsule Take 1 capsule (500 mg total) by mouth 2 (two) times daily for 7 days. 01/13/18 01/20/18  Glyn Ade, PA-C  Fluticasone-Umeclidin-Vilant (TRELEGY ELLIPTA) 100-62.5-25 MCG/INH AEPB Inhale 1 Inhaler into the lungs daily. Patient not taking: Reported on 01/13/2018 06/09/17   Marshell Garfinkel, MD  mirtazapine (REMERON) 15 MG tablet Take 15 mg by mouth at bedtime. 05/03/17   [provider]  ondansetron (ZOFRAN ODT) 4 MG disintegrating tablet Take 1 tablet (4 mg total) by mouth every 8 (eight) hours as needed for nausea or vomiting. 01/13/18   Glyn Ade, PA-C  oseltamivir (TAMIFLU) 75 MG capsule Take 1 capsule (75 mg total) by mouth every 12 (twelve) hours. 01/13/18   Glyn Ade, PA-C  traZODone (DESYREL) 50 MG tablet Take 1/2 tablet at night for a week, then increase to 1 tablet at night Patient taking differently: Take 50 mg by mouth at bedtime as needed for sleep. Take 1/2 tablet at night for a week, then increase to 1 tablet at night 10/02/17   Cameron Sprang, MD  Family History Family History  Family history unknown: Yes    Social History Social History   Tobacco Use  . Smoking status: Former Smoker    Packs/day: 2.00    Years: 30.00    Pack years: 60.00  . Smokeless tobacco: Never Used  . Tobacco comment: quit smoking over 50 years ago  Substance Use Topics  . Alcohol use: No  . Drug use: No     Allergies   Ibuprofen   Review of Systems Review of Systems  Unable to perform ROS: Mental status change     Physical Exam Updated Vital Signs BP (!) 129/58   Pulse 100   Temp 100.2 F (37.9 C) (Oral)   Resp 19   Wt 49.9 kg  (110 lb)   SpO2 96%   BMI 21.48 kg/m   Physical Exam  Constitutional:  Elderly female, lethargic, cough and appears uncomfortable  HENT:  Head: Atraumatic.  Mouth/Throat: Oropharynx is clear and moist.  Eyes: Conjunctivae are normal.  Neck: Neck supple.  Cardiovascular:  Tachycardia with systolic murmur  Pulmonary/Chest:  Decreased breath sounds with faint wheezes and rhonchi  Abdominal: Soft. There is no tenderness.  Neurological: GCS eye subscore is 3. GCS verbal subscore is 4. GCS motor subscore is 5.  Moving all 4 extremities  Skin: Skin is warm. No rash noted.  Psychiatric: She has a normal mood and affect.  Nursing note and vitals reviewed.    ED Treatments / Results  Labs (all labs ordered are listed, but only abnormal results are displayed) Labs Reviewed  COMPREHENSIVE METABOLIC PANEL - Abnormal; Notable for the following components:      Result Value   Chloride 94 (*)    CO2 34 (*)    Glucose, Bld 158 (*)    Total Protein 6.4 (*)    Albumin 3.4 (*)    All other components within normal limits  CBC WITH DIFFERENTIAL/PLATELET - Abnormal; Notable for the following components:   Hemoglobin 11.1 (*)    MCHC 29.1 (*)    Platelets 128 (*)    Lymphs Abs 0.5 (*)    All other components within normal limits  I-STAT CG4 LACTIC ACID, ED - Abnormal; Notable for the following components:   Lactic Acid, Venous 2.52 (*)    All other components within normal limits  I-STAT ARTERIAL BLOOD GAS, ED - Abnormal; Notable for the following components:   pH, Arterial 7.266 (*)    pCO2 arterial 85.4 (*)    pO2, Arterial 53.0 (*)    Bicarbonate 38.5 (*)    TCO2 41 (*)    Acid-Base Excess 8.0 (*)    All other components within normal limits  CULTURE, BLOOD (ROUTINE X 2)  CULTURE, BLOOD (ROUTINE X 2)  URINE CULTURE  URINALYSIS, ROUTINE W REFLEX MICROSCOPIC  BLOOD GAS, ARTERIAL  I-STAT CG4 LACTIC ACID, ED    EKG  EKG Interpretation  Date/Time:  Sunday January 14 2018  08:01:02 EST Ventricular Rate:  110 PR Interval:    QRS Duration: 103 QT Interval:  302 QTC Calculation: 409 R Axis:   93 Text Interpretation:  Sinus or ectopic atrial tachycardia Right axis deviation Borderline repolarization abnormality Artifact in lead(s) I II III aVR aVL aVF V3 V4 V5 V6 Poor data quality Otherwise no significant change Confirmed by Addison Lank [phone]) on 01/14/2018 9:30:32 AM       Radiology Dg Chest 2 View  Result Date: 01/13/2018 CLINICAL DATA:  Shortness of breath, wheezing and congestion. EXAM:  CHEST  2 VIEW COMPARISON:  09/13/2017 FINDINGS: The heart size and mediastinal contours are within normal limits. Aortic atherosclerosis. Both lungs are clear. Healing right posterior rib deformities are identified. IMPRESSION: 1. No acute cardiopulmonary abnormalities. 2.  Aortic Atherosclerosis (ICD10-I70.0). 3. Healing right posterior rib fractures. Electronically Signed   By: Kerby Moors M.D.   On: 01/13/2018 14:34   Dg Chest Portable 1 View  Result Date: 01/14/2018 CLINICAL DATA:  Shortness of breath this morning. EXAM: PORTABLE CHEST 1 VIEW COMPARISON:  01/13/2018, 09/13/2017 and 03/20/2017 FINDINGS: Lungs are hyperexpanded with subtle prominence of the bilateral infrahilar bronchovascular markings. No focal lobar consolidation or effusion. Mild increased lucency of the lungs. Cardiomediastinal silhouette and remainder of the exam is unchanged. Old posterior right rib fractures. IMPRESSION: Subtle prominence of the bilateral infrahilar bronchovascular markings which may be due to a degree of vascular congestion or viral bronchopneumonia. Emphysematous disease. Electronically Signed   By: Marin Olp M.D.   On: 01/14/2018 09:21    Procedures Procedures (including critical care time)    Medications Ordered in ED Medications  oseltamivir (TAMIFLU) capsule 75 mg (not administered)  sodium chloride 0.9 % bolus 1,000 mL (1,000 mLs Intravenous New Bag/Given 01/14/18  0837)  acetaminophen (TYLENOL) suppository 650 mg (650 mg Rectal Given 01/14/18 0847)  cefTRIAXone (ROCEPHIN) 1 g in sodium chloride 0.9 % 100 mL IVPB (1 g Intravenous New Bag/Given 01/14/18 0837)     Initial Impression / Assessment and Plan / ED Course  I have reviewed the triage vital signs and the nursing notes.  Pertinent labs & imaging results that were available during my care of the patient were reviewed by me and considered in my medical decision making (see chart for details).     BP (!) 129/58   Pulse 100   Temp 100.2 F (37.9 C) (Oral)   Resp 19   Wt 49.9 kg (110 lb)   SpO2 96%   BMI 21.48 kg/m    Final Clinical Impressions(s) / ED Diagnoses   Final diagnoses:  Influenza A  Acute UTI (urinary tract infection)  Hypoxia    ED Discharge Orders    None     8:00 AM Patient is home O2 dependent at 3.5 L he with increased confusion, persistent cough which has been ongoing for the past several days.  Was seen in ED yesterday and tested positive for influenza A.  Patient did receive 1 dose of Tamiflu yesterday, none since.  She has a urine that is questionable for UTI.  She was also started on Keflex.  Currently she has a low-grade temp of 100.2, is tachycardic with a heart rate of 129, and in respiratory discomfort with a respiratory rate of 23.  Initially she was 87% on room air which improves to 99% at 3.5 L.    Code sepsis initiated.  Patient given Rocephin for potential UTI.  A dose of Tamiflu given as well.  Tylenol for fever.  She will receive IV fluid, if elevated lactic acid will consider given the for fluid resuscitation at 30 mL/kg.  Care discussed with Dr. Leonette Monarch.   8:33 AM ABG showing an acid acidosis with a pH of 7.266 and CO2 of 85.4.  This could contribute to her confusion.  Patient will be placed on BiPAP.  9:34 AM Pt appears more comfortable.  Family is updated with plan.  Pt has elevated lactic acid of 2.52, but normal WBC.  Pt does not need aggressive  fluid resuscitation.  CXR showing  finding suggestisve of viral bronchopneumonia or vascular congestion.    9:59 AM Appreciate consultation from Internal Medicine Resident who agrees to see and admit pt for further management.    CRITICAL CARE Performed by: Domenic Moras Total critical care time: 45 minutes Critical care time was exclusive of separately billable procedures and treating other patients. Critical care was necessary to treat or prevent imminent or life-threatening deterioration. Critical care was time spent personally by me on the following activities: development of treatment plan with patient and/or surrogate as well as nursing, discussions with consultants, evaluation of patient's response to treatment, examination of patient, obtaining history from patient or surrogate, ordering and performing treatments and interventions, ordering and review of laboratory studies, ordering and review of radiographic studies, pulse oximetry and re-evaluation of patient's condition.    Domenic Moras, PA-C 01/14/18 1000

## 2018-01-15 ENCOUNTER — Other Ambulatory Visit: Payer: Self-pay | Admitting: Internal Medicine

## 2018-01-15 DIAGNOSIS — R0902 Hypoxemia: Secondary | ICD-10-CM

## 2018-01-15 DIAGNOSIS — J181 Lobar pneumonia, unspecified organism: Secondary | ICD-10-CM

## 2018-01-15 LAB — BASIC METABOLIC PANEL
ANION GAP: 8 (ref 5–15)
BUN: 13 mg/dL (ref 6–20)
CO2: 31 mmol/L (ref 22–32)
Calcium: 8.5 mg/dL — ABNORMAL LOW (ref 8.9–10.3)
Chloride: 103 mmol/L (ref 101–111)
Creatinine, Ser: 0.63 mg/dL (ref 0.44–1.00)
GFR calc Af Amer: >60 mL/min (ref >60)
Glucose, Bld: 105 mg/dL — ABNORMAL HIGH (ref 65–99)
POTASSIUM: 4.4 mmol/L (ref 3.5–5.1)
SODIUM: 142 mmol/L (ref 135–145)

## 2018-01-15 LAB — BLOOD CULTURE ID PANEL (REFLEXED)
ACINETOBACTER BAUMANNII: NOT DETECTED
CANDIDA ALBICANS: NOT DETECTED
CANDIDA GLABRATA: NOT DETECTED
Candida krusei: NOT DETECTED
Candida parapsilosis: NOT DETECTED
Candida tropicalis: NOT DETECTED
ENTEROBACTER CLOACAE COMPLEX: NOT DETECTED
ENTEROCOCCUS SPECIES: NOT DETECTED
Enterobacteriaceae species: NOT DETECTED
Escherichia coli: NOT DETECTED
HAEMOPHILUS INFLUENZAE: NOT DETECTED
Klebsiella oxytoca: NOT DETECTED
Klebsiella pneumoniae: NOT DETECTED
Listeria monocytogenes: NOT DETECTED
NEISSERIA MENINGITIDIS: NOT DETECTED
PSEUDOMONAS AERUGINOSA: NOT DETECTED
Proteus species: NOT DETECTED
STAPHYLOCOCCUS SPECIES: NOT DETECTED
STREPTOCOCCUS AGALACTIAE: NOT DETECTED
STREPTOCOCCUS PNEUMONIAE: NOT DETECTED
Serratia marcescens: NOT DETECTED
Staphylococcus aureus (BCID): NOT DETECTED
Streptococcus pyogenes: NOT DETECTED
Streptococcus species: NOT DETECTED

## 2018-01-15 LAB — MRSA PCR SCREENING: MRSA by PCR: NEGATIVE

## 2018-01-15 LAB — CBC
HCT: 32.5 % — ABNORMAL LOW (ref 36.0–46.0)
HEMOGLOBIN: 9.5 g/dL — AB (ref 12.0–15.0)
MCH: 27.7 pg (ref 26.0–34.0)
MCHC: 29.2 g/dL — ABNORMAL LOW (ref 30.0–36.0)
MCV: 94.8 fL (ref 78.0–100.0)
Platelets: 124 10*3/uL — ABNORMAL LOW (ref 150–400)
RBC: 3.43 MIL/uL — ABNORMAL LOW (ref 3.87–5.11)
RDW: 13.3 % (ref 11.5–15.5)
WBC: 8.2 10*3/uL (ref 4.0–10.5)

## 2018-01-15 LAB — BLOOD GAS, ARTERIAL
Acid-Base Excess: 6 mmol/L — ABNORMAL HIGH (ref 0.0–2.0)
Bicarbonate: 32.2 mmol/L — ABNORMAL HIGH (ref 20.0–28.0)
DRAWN BY: 35849
O2 CONTENT: 3 L/min
O2 SAT: 98.8 %
PATIENT TEMPERATURE: 98.6
pCO2 arterial: 67.4 mmHg (ref 32.0–48.0)
pH, Arterial: 7.3 — ABNORMAL LOW (ref 7.350–7.450)
pO2, Arterial: 156 mmHg — ABNORMAL HIGH (ref 83.0–108.0)

## 2018-01-15 MED ORDER — IPRATROPIUM-ALBUTEROL 0.5-2.5 (3) MG/3ML IN SOLN: 3.0000 mL | RESPIRATORY_TRACT | Status: DC | PRN

## 2018-01-15 MED ORDER — METHYLPREDNISOLONE SODIUM SUCC 40 MG IJ SOLR
40.0000 mg | Freq: Every day | INTRAMUSCULAR | Status: DC
  Administered 2018-01-15 – 2018-01-17 (×3): 40 mg via INTRAVENOUS
  Filled 2018-01-15 (×3): qty 1

## 2018-01-15 MED ORDER — IPRATROPIUM-ALBUTEROL 0.5-2.5 (3) MG/3ML IN SOLN
3.0000 mL | Freq: Four times a day (QID) | RESPIRATORY_TRACT | Status: DC
  Administered 2018-01-15 (×2): 3 mL via RESPIRATORY_TRACT
  Filled 2018-01-15 (×2): qty 3

## 2018-01-15 MED ORDER — SODIUM CHLORIDE 0.9 % IV SOLN
500.0000 mg | INTRAVENOUS | Status: DC
  Administered 2018-01-15: 500 mg via INTRAVENOUS
  Filled 2018-01-15 (×2): qty 500

## 2018-01-15 MED ORDER — OSELTAMIVIR PHOSPHATE 30 MG PO CAPS
30.0000 mg | ORAL_CAPSULE | Freq: Two times a day (BID) | ORAL | Status: AC
  Administered 2018-01-15 – 2018-01-18 (×7): 30 mg via ORAL
  Filled 2018-01-15 (×7): qty 1

## 2018-01-15 MED ORDER — IPRATROPIUM-ALBUTEROL 0.5-2.5 (3) MG/3ML IN SOLN
3.0000 mL | Freq: Three times a day (TID) | RESPIRATORY_TRACT | Status: DC
  Administered 2018-01-16 – 2018-01-19 (×10): 3 mL via RESPIRATORY_TRACT
  Filled 2018-01-15 (×10): qty 3

## 2018-01-15 NOTE — Progress Notes (Signed)
PROGRESS NOTE    Samantha Zamora  KWI:[id number] DOB: [birth year] DOA: 01/14/2018 PCP: Flossie Buffy, NP    Brief Narrative:  84 year old female who presents with altered mental status. She does have significant past medical history of COPD, chronic hypoxic respiratory failure, dementia and anxiety. For the last 2 days prior to hospitalization she was found to have worsening confusion, fever and increased urinary frequency. Was diagnosed with influenza and urinary tract infection, antibiotics were prescribed, she was unable to fill his prescriptions, on the day of admission she was found unresponsive. On initial physical examination temperature 100.2, heart rate 111, respiratory rate 18, oxygen saturation 100%, on BiPAP. Her lungs had decreased breath sounds bilaterally, no wheezing, heart S1-S2 present rhythmic, the abdomen was soft nontender, no lower extremity edema. Sodium 139, potassium 4.1, chloride and 4, bicarbonate 34, glucose 100 T [place] 10, creatinine 0.70, white count 8.5, hemoglobin 11.1, hematocrit 30.2, platelets 128. Arterial blood gas 7.26/ 85.4/ 53./ 41/ oxygen saturation 77%. Influenza A+. 6-30 white cells, 30 protein, specific gravity 1.011. Chest x-ray, hyperinflation, right base patchy ill-defined infiltrate. EKG sinus tachycardia, normal axis, normal intervals.  Patient was admitted to the hospital working diagnosis of acute on chronic hypoxic/hypercapnic respiratory failure.   Assessment & Plan:   Active Problems:   Influenza A   1. Acute on chronic hypoxic and hypercapnic respiratory failure in the setting of acute influenza A. Will continue oxymetry monitoring, supplemental 02 per [place], will target 02 saturation above 88%. Will add systemic steroids and bronchodilators, chest film with possible pneumonia, start azithromycin. Continue Oseltamivir.  Will follow abg, off bipap, will target a pH above 7.20.   2. COPD exacerbation with right lower lobe pneumonia. Will  continue bronchodilator therapy, oxygen supplementation and antibiotic therapy with azithromycin, Will allow patient to eat.   3. Metabolic encephalopathy. Improved this am, continue respiratory support and correction of respiratory acidosis, will continue neuro checks per unit protocol. Will plan to have patient out of bed and work with physical therapy. Allow patient to eat.    DVT prophylaxis: enoxaparin  Code Status: dnr Family Communication:  No family at the bedside Disposition Plan: home   Consultants:     Procedures:     Antimicrobials:    Azithromycin   Subjective: Patient has been agitated last night, trying to get out of the bed, sitter at the bedside. This am is more calm, persistent dyspnea and cough, positive generalized malaise.   Objective: Vitals:   01/15/18 0235 01/15/18 0527 01/15/18 0532 01/15/18 0714  BP:  (!) 154/77  (!) 146/73  Pulse:  89    Resp:  (!) 24 16 17   Temp:  98.1 F (36.7 C)  98.7 F (37.1 C)  TempSrc:  Axillary  Oral  SpO2:  100%    Weight: 49.9 kg (110 lb 0.2 oz) 49.8 kg (109 lb 12.6 oz)    Height: 5\' 3"  (1.6 m)       Intake/Output Summary (Last 24 hours) at 01/15/2018 0845 Last data filed at 01/15/2018 2426 Gross per 24 hour  Intake 500 ml  Output 776 ml  Net -276 ml   Filed Weights   01/14/18 0806 01/15/18 0235 01/15/18 0527  Weight: 49.9 kg (110 lb) 49.9 kg (110 lb 0.2 oz) 49.8 kg (109 lb 12.6 oz)    Examination:   General: Not in pain or dyspnea, deconditioned Neurology: Awake and alert, non focal  E ENT: mild pallor, no icterus, oral mucosa moist Cardiovascular: No JVD. S1-S2 present,  rhythmic, no gallops, rubs, or murmurs. No lower extremity edema. Pulmonary: decreaseed breath sounds bilaterally, poor air movement, positive  expiratory wheezing and rhonchi, scattered rales. Gastrointestinal. Abdomen flat, no organomegaly, non tender, no rebound or guarding Skin. No rashes Musculoskeletal: no joint  deformities     Data Reviewed: I have personally reviewed following labs and imaging studies  CBC: Recent Labs  Lab 01/13/18 1605 01/14/18 0756 01/15/18 0135  WBC 6.6 8.5 8.2  NEUTROABS 5.1 7.6  --   HGB 10.7* 11.1* 9.5*  HCT 35.5* 38.2 32.5*  MCV 94.4 96.2 94.8  PLT 151 128* 825*   Basic Metabolic Panel: Recent Labs  Lab 01/13/18 1605 01/14/18 0756 01/15/18 0135  NA 136 139 142  K 4.2 4.1 4.4  CL 92* 94* 103  CO2 38* 34* 31  GLUCOSE 119* 158* 105*  BUN 11 10 13   CREATININE 0.64 0.70 0.63  CALCIUM 9.2 9.4 8.5*   GFR: Estimated Creatinine Clearance: 36 mL/min (by C-G formula based on SCr of 0.63 mg/dL). Liver Function Tests: Recent Labs  Lab 01/14/18 0756  AST 41  ALT 20  ALKPHOS 68  BILITOT 0.8  PROT 6.4*  ALBUMIN 3.4*   No results for input(s): LIPASE, AMYLASE in the last 168 hours. No results for input(s): AMMONIA in the last 168 hours. Coagulation Profile: No results for input(s): INR, PROTIME in the last 168 hours. Cardiac Enzymes: No results for input(s): CKTOTAL, CKMB, CKMBINDEX, TROPONINI in the last 168 hours. BNP (last 3 results) No results for input(s): PROBNP in the last 8760 hours. HbA1C: No results for input(s): HGBA1C in the last 72 hours. CBG: No results for input(s): GLUCAP in the last 168 hours. Lipid Profile: No results for input(s): CHOL, HDL, LDLCALC, TRIG, CHOLHDL, LDLDIRECT in the last 72 hours. Thyroid Function Tests: No results for input(s): TSH, T4TOTAL, FREET4, T3FREE, THYROIDAB in the last 72 hours. Anemia Panel: No results for input(s): VITAMINB12, FOLATE, FERRITIN, TIBC, IRON, RETICCTPCT in the last 72 hours.    Radiology Studies: I have reviewed all of the imaging during this hospital visit personally     Scheduled Meds: . aspirin EC  81 mg Oral Daily  . enoxaparin (LOVENOX) injection  30 mg Subcutaneous Q24H  . ipratropium-albuterol  3 mL Nebulization TID  . oseltamivir  75 mg Oral BID   Continuous  Infusions:   LOS: 1 day        Tawni Millers, MD [facility] Pager [phone]

## 2018-01-15 NOTE — Progress Notes (Addendum)
0700 Bedside shift report, pt confused, picking at linen, tape, and mittens. NT at bedside for pt safety. Awaiting sitter. Fall precautions in place, [facility].   0800 Pt assessed, see flow sheet. Pt A&Ox1, to name only. Reoriented. Pt has non productive cough, denies SOB, pain. Safety sitter at bedside. WCTM.   0915 Pt medicated per MAR, MD at bedside, updated with POC, cereal provided to pt for breakfast, tray ordered. WCTM.   1200 MD paged for CO2 67.4 and HCO3 32 and to inform MD that dgt-in-law would like update, Leo Grosser [phone]. Awaiting call back from MD  1300 Pt sitting up in bed eating lunch, no complaints, WCTM.   1530 Pt's dgt-in-law at bedside, updated with POC. Pt sleeping comfortably, NAD, WCTM.

## 2018-01-15 NOTE — Progress Notes (Signed)
Rt attempted to obtain blood gas X's 2, pt stated "get that needle out of my arm" raised arm in an attempt to get needle out. Pt refuses to be stuck for an ABG. RT will continue to monitor as needed.

## 2018-01-16 LAB — BASIC METABOLIC PANEL
Anion gap: 9 (ref 5–15)
BUN: 16 mg/dL (ref 6–20)
CALCIUM: 9 mg/dL (ref 8.9–10.3)
CO2: 31 mmol/L (ref 22–32)
CREATININE: 0.63 mg/dL (ref 0.44–1.00)
Chloride: 102 mmol/L (ref 101–111)
Glucose, Bld: 98 mg/dL (ref 65–99)
Potassium: 4.4 mmol/L (ref 3.5–5.1)
SODIUM: 142 mmol/L (ref 135–145)

## 2018-01-16 LAB — URINE CULTURE: Culture: >=100000 — AB

## 2018-01-16 LAB — CBC WITH DIFFERENTIAL/PLATELET
BASOS ABS: 0 10*3/uL (ref 0.0–0.1)
BASOS PCT: 0 %
EOS ABS: 0 10*3/uL (ref 0.0–0.7)
EOS PCT: 0 %
HEMATOCRIT: 30.6 % — AB (ref 36.0–46.0)
Hemoglobin: 8.9 g/dL — ABNORMAL LOW (ref 12.0–15.0)
Lymphocytes Relative: 9 %
Lymphs Abs: 0.5 10*3/uL — ABNORMAL LOW (ref 0.7–4.0)
MCH: 27.2 pg (ref 26.0–34.0)
MCHC: 29.1 g/dL — ABNORMAL LOW (ref 30.0–36.0)
MCV: 93.6 fL (ref 78.0–100.0)
MONOS PCT: 7 %
Monocytes Absolute: 0.4 10*3/uL (ref 0.1–1.0)
NEUTROS ABS: 4.4 10*3/uL (ref 1.7–7.7)
Neutrophils Relative %: 84 %
PLATELETS: 124 10*3/uL — AB (ref 150–400)
RBC: 3.27 MIL/uL — ABNORMAL LOW (ref 3.87–5.11)
RDW: 13.4 % (ref 11.5–15.5)
WBC: 5.2 10*3/uL (ref 4.0–10.5)

## 2018-01-16 MED ORDER — SODIUM CHLORIDE 0.9 % IV SOLN
1.0000 g | INTRAVENOUS | Status: DC
  Administered 2018-01-16 – 2018-01-18 (×3): 1 g via INTRAVENOUS
  Filled 2018-01-16 (×3): qty 10

## 2018-01-16 NOTE — Evaluation (Signed)
Physical Therapy Evaluation Patient Details Name: Samantha Zamora MRN: [id number] DOB: 06-19-1924 Today's Date: 01/16/2018   History of Present Illness  84 year old female who presents with altered mental status. She does have significant past medical history of COPD, chronic hypoxic respiratory failure, dementia and anxiety. For the last 2 days prior to hospitalization she was found to have worsening confusion, fever and increased urinary frequency. Was diagnosed with influenza and urinary tract infection, antibiotics were prescribed, she was unable to fill his prescriptions, on the day of admission she was found unresponsive. On initial physical examination temperature 100.2, heart rate 111, respiratory rate 18, oxygen saturation 100%, on BiPAP.   Pt positive for flu.  Clinical Impression  Pt admitted with above diagnosis. Pt currently with functional limitations due to the deficits listed below (see PT Problem List). Pt was able to ambulate in room with RW with min assist.  Desat on 2L (this is what pt was on on arrival) therefore had to incr to 4L to keep sats >90%.  Was able to leave pt on 3L O2 at end of treatment. HR also 77-115 bpm with activity.  Pt nurse made aware.  Called daughter in law and asked about home situation as pt with dementia.  Daughter in law states that pt will be alone at times.  Feel that pt would benefit from further therapy.  If qualifies for [facility] would be a great candidate.  If not, will need SNF.   Pt will benefit from skilled PT to increase their independence and safety with mobility to allow discharge to the venue listed below.      Follow Up Recommendations SNF;Supervision/Assistance - 24 hour    Equipment Recommendations  None recommended by PT    Recommendations for Other Services       Precautions / Restrictions Precautions Precautions: Fall Restrictions Weight Bearing Restrictions: No      Mobility  Bed Mobility Overal bed mobility: Needs  Assistance Bed Mobility: Supine to Sit     Supine to sit: Min assist     General bed mobility comments: Needed assist for elevation of trunk to come to EOB.   Transfers Overall transfer level: Needs assistance Equipment used: Rolling walker (2 wheeled) Transfers: Sit to/from Stand Sit to Stand: Min assist         General transfer comment: asssit to power up and steady once up  Ambulation/Gait Ambulation/Gait assistance: Min assist;+2 safety/equipment Ambulation Distance (Feet): 40 Feet Assistive device: Rolling walker (2 wheeled) Gait Pattern/deviations: Step-through pattern;Decreased stride length;Staggering left;Staggering right;Drifts right/left;Wide base of support   Gait velocity interpretation: Below normal speed for age/gender General Gait Details: Pt was able to ambulate in room needing cues to sequence steps and RW. Steering assist for RW as well with pt with overall decr postural stability.   Stairs            Wheelchair Mobility    Modified Rankin (Stroke Patients Only)       Balance Overall balance assessment: Needs assistance Sitting-balance support: No upper extremity supported;Feet supported Sitting balance-Leahy Scale: Fair     Standing balance support: Bilateral upper extremity supported;During functional activity Standing balance-Leahy Scale: Poor Standing balance comment: relies on RW for balance and external support needed.                             Pertinent Vitals/Pain Pain Assessment: No/denies pain    Home Living Family/patient expects to be discharged to::  Private residence Living Arrangements: Children Available Help at Discharge: Family;Available PRN/intermittently(son works, daughter in Sports coach in and out) Type of Home: House Home Access: Stairs to enter   [employer] of Steps: unsure - pt says she always has help Home Layout: One level Home Equipment: [place] - 4 wheels;Bedside commode;Wheelchair -  manual(3.5 LO2 at home PTA per chart)      Prior Function Level of Independence: Independent with assistive device(s)         Comments: Called son and no answer. Called daughter in law and she states that pt is alone during day at times.  She states she used RW around the house.      Hand Dominance        Extremity/Trunk Assessment   Upper Extremity Assessment Upper Extremity Assessment: Defer to OT evaluation    Lower Extremity Assessment Lower Extremity Assessment: Generalized weakness    Cervical / Trunk Assessment Cervical / Trunk Assessment: Kyphotic  Communication   Communication: No difficulties  Cognition Arousal/Alertness: Awake/alert Behavior During Therapy: WFL for tasks assessed/performed Overall Cognitive Status: History of cognitive impairments - at baseline Area of Impairment: Orientation;Memory;Following commands;Safety/judgement;Awareness;Problem solving                 Orientation Level: Disoriented to;Time;Situation(knew she was in hospital )     Following Commands: Follows one step commands with increased time Safety/Judgement: Decreased awareness of safety;Decreased awareness of deficits   Problem Solving: Decreased initiation;Difficulty sequencing;Requires verbal cues;Requires tactile cues General Comments: dementia      General Comments      Exercises     Assessment/Plan    PT Assessment Patient needs continued PT services  PT Problem List Decreased strength;Decreased activity tolerance;Decreased balance;Decreased mobility;Decreased safety awareness;Decreased knowledge of precautions;Decreased cognition;Decreased knowledge of use of DME;Cardiopulmonary status limiting activity       PT Treatment Interventions DME instruction;Gait training;Functional mobility training;Therapeutic activities;Therapeutic exercise;Balance training;Patient/family education    PT Goals (Current goals can be found in the Care Plan section)  Acute  Rehab PT Goals Patient Stated Goal: to gohome PT Goal Formulation: With patient Time For Goal Achievement: 01/30/18 Potential to Achieve Goals: Good    Frequency Min 3X/week   Barriers to discharge Decreased caregiver support(son works and she is alone at times)      Co-evaluation               AM-PAC PT "6 Clicks" Daily Activity  Outcome Measure Difficulty turning over in bed (including adjusting bedclothes, sheets and blankets)?: Unable Difficulty moving from lying on back to sitting on the side of the bed? : Unable Difficulty sitting down on and standing up from a chair with arms (e.g., wheelchair, bedside commode, etc,.)?: A Little Help needed moving to and from a bed to chair (including a wheelchair)?: A Little Help needed walking in hospital room?: A Lot Help needed climbing 3-5 steps with a railing? : Total 6 Click Score: 11    End of Session Equipment Utilized During Treatment: Gait belt;Oxygen Activity Tolerance: Patient limited by fatigue Patient left: in chair;with call bell/phone within reach;with chair alarm set;with nursing/sitter in room Nurse Communication: Mobility status PT Visit Diagnosis: Muscle weakness (generalized) (M62.81);History of falling (Z91.81);Unsteadiness on feet (R26.81)    Time: 6045-4098 PT Time Calculation (min) (ACUTE ONLY): 34 min   Charges:   PT Evaluation $PT Eval Moderate Complexity: 1 Mod PT Treatments $Gait Training: 8-22 mins   PT G Codes:        Jaystin Mcgarvey,PT Acute Rehabilitation  [phone] (pager)   Denice Paradise 01/16/2018, 10:33 AM

## 2018-01-16 NOTE — Progress Notes (Addendum)
PROGRESS NOTE    Samantha Zamora  OIZ:[id number] DOB: 11-18-1924 DOA: 01/14/2018 PCP: Flossie Buffy, NP    Brief Narrative:  84 year old female who presents with altered mental status. She does have significant past medical history of COPD, chronic hypoxic respiratory failure, dementia and anxiety. For the last 2 days prior to hospitalization she was found to have worsening confusion, fever and increased urinary frequency. Was diagnosed with influenza and urinary tract infection, antibiotics were prescribed, she was unable to fill his prescriptions, on the day of admission she was found unresponsive. On initial physical examination temperature 100.2, heart rate 111, respiratory rate 18, oxygen saturation 100%, on BiPAP. Her lungs had decreased breath sounds bilaterally, no wheezing, heart S1-S2 present rhythmic, the abdomen was soft nontender, no lower extremity edema. Sodium 139, potassium 4.1, chloride and 4, bicarbonate 34, glucose 100 T [place] 10, creatinine 0.70, white count 8.5, hemoglobin 11.1, hematocrit 30.2, platelets 128. Arterial blood gas 7.26/ 85.4/ 53./ 41/ oxygen saturation 77%. Influenza A+. 6-30 white cells, 30 protein, specific gravity 1.011. Chest x-ray, hyperinflation, right base patchy ill-defined infiltrate. EKG sinus tachycardia, normal axis, normal intervals.  Patient was admitted to the hospital working diagnosis of acute on chronic hypoxic/hypercapnic respiratory failure due to COPD exacerbation due to Influenza A and community acquired pneumonia, right lower lobe.    Assessment & Plan:   Active Problems:   Influenza A  1. Acute on chronic hypoxic and hypercapnic respiratory failure in the setting of acute influenza A. Oxymetry monitoring at a 100% on 3 LPM, continue supplemental 02 per [place], will target 02 saturation above 88%. Improved pH, will continue aggressive bronchodilator therapy and systemic steroids. Tamiflu for 5 days. Sepsis with influenza A with  hypoxic respiratory failure, present on admission.    2. COPD exacerbation with right lower lobe pneumonia. Positive blood culture gram positive cocci, will change antibiotic therapy to cefrtiazone. Will repeat blood cultures today, will continue to follow temperature curve and cell count. Continue steroids and bronchodilator therapy.   3. Metabolic encephalopathy. Patient more awake and alert, will continue neuro check per unit protocol. Did not use bipap last night.   4. Deconditioning. Transfer to the medical unit, out of bed to chair tid with meals, physical therapy evaluation.    DVT prophylaxis: enoxaparin  Code Status: dnr Family Communication:  No family at the bedside Disposition Plan: home   Consultants:     Procedures:     Antimicrobials:    Ceftriaxone   Subjective: Patient very weak and deconditioned, dyspnea improving, no confusion or agitation. Did not use bipap all night (only until 3 am). Poor oral intake.   Objective: Vitals:   01/15/18 2323 01/16/18 0259 01/16/18 0346 01/16/18 0500  BP: (!) 107/51  (!) 154/72   Pulse: (!) 57 63 68   Resp: 18 15 (!) 21   Temp: 97.6 F (36.4 C)  98.2 F (36.8 C)   TempSrc: Axillary  Oral   SpO2: 100% 100% 100%   Weight:    49.5 kg (109 lb 2 oz)  Height:        Intake/Output Summary (Last 24 hours) at 01/16/2018 0748 Last data filed at 01/15/2018 2055 Gross per 24 hour  Intake 550 ml  Output 802 ml  Net -252 ml   Filed Weights   01/15/18 0235 01/15/18 0527 01/16/18 0500  Weight: 49.9 kg (110 lb 0.2 oz) 49.8 kg (109 lb 12.6 oz) 49.5 kg (109 lb 2 oz)    Examination:   General:  Not in pain or dyspnea, deconditioned Neurology: Awake and alert, non focal  E ENT: mild pallor, no icterus, oral mucosa moist Cardiovascular: No JVD. S1-S2 present, rhythmic, no gallops, rubs, or murmurs. No lower extremity edema. Pulmonary: decreased breath sounds bilaterally, poor air movement, no wheezing positive  expiratory rhonchi and rales, bilaterally and difusse. Gastrointestinal. Abdomen flat, no organomegaly, non tender, no rebound or guarding Skin. No rashes Musculoskeletal: no joint deformities     Data Reviewed: I have personally reviewed following labs and imaging studies  CBC: Recent Labs  Lab 01/13/18 1605 01/14/18 0756 01/15/18 0135 01/16/18 0213  WBC 6.6 8.5 8.2 5.2  NEUTROABS 5.1 7.6  --  4.4  HGB 10.7* 11.1* 9.5* 8.9*  HCT 35.5* 38.2 32.5* 30.6*  MCV 94.4 96.2 94.8 93.6  PLT 151 128* 124* 465*   Basic Metabolic Panel: Recent Labs  Lab 01/13/18 1605 01/14/18 0756 01/15/18 0135 01/16/18 0213  NA 136 139 142 142  K 4.2 4.1 4.4 4.4  CL 92* 94* 103 102  CO2 38* 34* 31 31  GLUCOSE 119* 158* 105* 98  BUN 11 10 13 16   CREATININE 0.64 0.70 0.63 0.63  CALCIUM 9.2 9.4 8.5* 9.0   GFR: Estimated Creatinine Clearance: 35.8 mL/min (by C-G formula based on SCr of 0.63 mg/dL). Liver Function Tests: Recent Labs  Lab 01/14/18 0756  AST 41  ALT 20  ALKPHOS 68  BILITOT 0.8  PROT 6.4*  ALBUMIN 3.4*   No results for input(s): LIPASE, AMYLASE in the last 168 hours. No results for input(s): AMMONIA in the last 168 hours. Coagulation Profile: No results for input(s): INR, PROTIME in the last 168 hours. Cardiac Enzymes: No results for input(s): CKTOTAL, CKMB, CKMBINDEX, TROPONINI in the last 168 hours. BNP (last 3 results) No results for input(s): PROBNP in the last 8760 hours. HbA1C: No results for input(s): HGBA1C in the last 72 hours. CBG: No results for input(s): GLUCAP in the last 168 hours. Lipid Profile: No results for input(s): CHOL, HDL, LDLCALC, TRIG, CHOLHDL, LDLDIRECT in the last 72 hours. Thyroid Function Tests: No results for input(s): TSH, T4TOTAL, FREET4, T3FREE, THYROIDAB in the last 72 hours. Anemia Panel: No results for input(s): VITAMINB12, FOLATE, FERRITIN, TIBC, IRON, RETICCTPCT in the last 72 hours.    Radiology Studies: I have reviewed  all of the imaging during this hospital visit personally     Scheduled Meds: . aspirin EC  81 mg Oral Daily  . enoxaparin (LOVENOX) injection  30 mg Subcutaneous Q24H  . ipratropium-albuterol  3 mL Nebulization TID  . methylPREDNISolone (SOLU-MEDROL) injection  40 mg Intravenous Daily  . oseltamivir  30 mg Oral BID   Continuous Infusions: . cefTRIAXone (ROCEPHIN)  IV       LOS: 2 days        Yanis Juma Gerome Apley, MD [facility] Pager [phone]

## 2018-01-16 NOTE — [place] FL2 (Signed)
[place] MEDICAID FL2 LEVEL OF CARE SCREENING TOOL     IDENTIFICATION  Patient Name: Samantha Zamora Birthdate: 1924/01/13 Sex: female Admission Date (Current Location): 01/14/2018  [facility] and [place] Number:  Herbalist and Address:  [facility], [place] [address] [place], [place] [address]      Provider Number: [id number]  Attending Physician Name and Address:  Tawni Millers,*  Relative Name and Phone Number:  Jorene Guest, son, [phone]    Current Level of Care: Hospital Recommended Level of Care: [place] Prior Approval Number:    Date Approved/Denied:   PASRR Number: [id number]  Discharge Plan: SNF    Current Diagnoses: Patient Active Problem List   Diagnosis Date Noted  . Influenza A 01/14/2018  . Hypoxia   . HCAP (healthcare-associated pneumonia) 09/06/2017  . Mild dementia 03/31/2017  . Anxiety 03/31/2017  . Depression 03/31/2017  . Chronic rhinitis 03/24/2017  . Transient alteration of awareness 03/24/2017  . 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 COPD (chronic obstructive pulmonary disease) ([place]) 03/20/2017    Orientation RESPIRATION BLADDER Height & Weight     Self  O2(nasal cannula 3L) Continent Weight: 109 lb 2 oz (49.5 kg) Height:  5\' 3"  (160 cm)  BEHAVIORAL SYMPTOMS/MOOD NEUROLOGICAL BOWEL NUTRITION STATUS      Continent Diet(please see DC summary)  AMBULATORY STATUS COMMUNICATION OF NEEDS Skin   Limited Assist Verbally Normal                       Personal Care Assistance Level of Assistance  Bathing, Feeding, Dressing Bathing Assistance: Limited assistance Feeding assistance: Independent Dressing Assistance: Limited assistance     Functional Limitations Info  Hearing, Speech, Sight Sight Info: Adequate Hearing Info: Adequate      SPECIAL CARE FACTORS FREQUENCY  PT (By licensed PT)     PT Frequency: 5x/week              Contractures Contractures Info: Not  present    Additional Factors Info  Code Status, Allergies, Isolation Precautions Code Status Info: DNR Allergies Info: Ibuprofen     Isolation Precautions Info: droplet precautions     Current Medications (01/16/2018):  This is the current hospital active medication list Current Facility-Administered Medications  Medication Dose Route Frequency Provider Last Rate Last Dose  . acetaminophen (TYLENOL) tablet 650 mg  650 mg Oral Q6H PRN Velna Ochs, MD       Or  . acetaminophen (TYLENOL) suppository 650 mg  650 mg Rectal Q6H PRN Velna Ochs, MD      . aspirin EC tablet 81 mg  81 mg Oral Daily Velna Ochs, MD   81 mg at 01/16/18 0943  . cefTRIAXone (ROCEPHIN) 1 g in sodium chloride 0.9 % 100 mL IVPB  1 g Intravenous Q24H Arrien, Jimmy Picket, MD   Stopped at 01/16/18 0900  . enoxaparin (LOVENOX) injection 30 mg  30 mg Subcutaneous Q24H Bartholomew Crews, MD   30 mg at 01/15/18 1549  . ipratropium-albuterol (DUONEB) 0.5-2.5 (3) MG/3ML nebulizer solution 3 mL  3 mL Nebulization Q2H PRN Arrien, Jimmy Picket, MD      . ipratropium-albuterol (DUONEB) 0.5-2.5 (3) MG/3ML nebulizer solution 3 mL  3 mL Nebulization TID Arrien, Jimmy Picket, MD   3 mL at 01/16/18 1320  . methylPREDNISolone sodium succinate (SOLU-MEDROL) 40 mg/mL injection 40 mg  40 mg Intravenous Daily Arrien, Jimmy Picket, MD   40 mg at 01/16/18 0942  .  ondansetron (ZOFRAN) tablet 4 mg  4 mg Oral Q6H PRN Velna Ochs, MD       Or  . ondansetron [facility]) injection 4 mg  4 mg Intravenous Q6H PRN Velna Ochs, MD      . oseltamivir (TAMIFLU) capsule 30 mg  30 mg Oral BID Tawni Millers, MD   30 mg at 01/16/18 0272     Discharge Medications: Please see discharge summary for a list of discharge medications.  Relevant Imaging Results:  Relevant Lab Results:   Additional Information SSN: [id number]  Estanislado Emms, LCSW

## 2018-01-16 NOTE — Progress Notes (Signed)
PHARMACY - PHYSICIAN COMMUNICATION CRITICAL VALUE ALERT - BLOOD CULTURE IDENTIFICATION (BCID)  Samantha Zamora is an 84 y.o. female who presented to [facility] on 01/14/2018   Assessment:  Flu+, COPD  Name of physician (or Provider) Contacted: C. Bodenheimer ([facility])  Current antibiotics: Azithromycin   Changes to prescribed antibiotics recommended:  No changes for now, possible contaminant   2/17 Blood culture: gram positive cocci in one bottle, BCID with no detection  Results for orders placed or performed during the hospital encounter of 01/14/18  Blood Culture ID Panel (Reflexed) (Collected: 01/14/2018  8:32 AM)  Result Value Ref Range   Enterococcus species NOT DETECTED NOT DETECTED   Listeria monocytogenes NOT DETECTED NOT DETECTED   Staphylococcus species NOT DETECTED NOT DETECTED   Staphylococcus aureus NOT DETECTED NOT DETECTED   Streptococcus species NOT DETECTED NOT DETECTED   Streptococcus agalactiae NOT DETECTED NOT DETECTED   Streptococcus pneumoniae NOT DETECTED NOT DETECTED   Streptococcus pyogenes NOT DETECTED NOT DETECTED   Acinetobacter baumannii NOT DETECTED NOT DETECTED   Enterobacteriaceae species NOT DETECTED NOT DETECTED   Enterobacter cloacae complex NOT DETECTED NOT DETECTED   Escherichia coli NOT DETECTED NOT DETECTED   Klebsiella oxytoca NOT DETECTED NOT DETECTED   Klebsiella pneumoniae NOT DETECTED NOT DETECTED   Proteus species NOT DETECTED NOT DETECTED   Serratia marcescens NOT DETECTED NOT DETECTED   Haemophilus influenzae NOT DETECTED NOT DETECTED   Neisseria meningitidis NOT DETECTED NOT DETECTED   Pseudomonas aeruginosa NOT DETECTED NOT DETECTED   Candida albicans NOT DETECTED NOT DETECTED   Candida glabrata NOT DETECTED NOT DETECTED   Candida krusei NOT DETECTED NOT DETECTED   Candida parapsilosis NOT DETECTED NOT DETECTED   Candida tropicalis NOT DETECTED NOT DETECTED    Samantha Zamora 01/16/2018  12:03 AM

## 2018-01-17 ENCOUNTER — Telehealth: Payer: Self-pay | Admitting: Emergency Medicine

## 2018-01-17 DIAGNOSIS — J209 Acute bronchitis, unspecified: Secondary | ICD-10-CM

## 2018-01-17 DIAGNOSIS — J440 Chronic obstructive pulmonary disease with acute lower respiratory infection: Secondary | ICD-10-CM

## 2018-01-17 LAB — CBC WITH DIFFERENTIAL/PLATELET
BASOS ABS: 0 10*3/uL (ref 0.0–0.1)
BASOS PCT: 0 %
EOS PCT: 0 %
Eosinophils Absolute: 0 10*3/uL (ref 0.0–0.7)
HCT: 31.9 % — ABNORMAL LOW (ref 36.0–46.0)
Hemoglobin: 9.4 g/dL — ABNORMAL LOW (ref 12.0–15.0)
Lymphocytes Relative: 20 %
Lymphs Abs: 0.9 10*3/uL (ref 0.7–4.0)
MCH: 27.4 pg (ref 26.0–34.0)
MCHC: 29.5 g/dL — AB (ref 30.0–36.0)
MCV: 93 fL (ref 78.0–100.0)
MONO ABS: 0.6 10*3/uL (ref 0.1–1.0)
MONOS PCT: 13 %
Neutro Abs: 3.1 10*3/uL (ref 1.7–7.7)
Neutrophils Relative %: 67 %
Platelets: 143 10*3/uL — ABNORMAL LOW (ref 150–400)
RBC: 3.43 MIL/uL — ABNORMAL LOW (ref 3.87–5.11)
RDW: 13.5 % (ref 11.5–15.5)
WBC: 4.6 10*3/uL (ref 4.0–10.5)

## 2018-01-17 LAB — PROCALCITONIN: PROCALCITONIN: 0.2 ng/mL

## 2018-01-17 LAB — GLUCOSE, CAPILLARY: GLUCOSE-CAPILLARY: 73 mg/dL (ref 65–99)

## 2018-01-17 MED ORDER — DM-GUAIFENESIN ER 30-600 MG PO TB12
1.0000 | ORAL_TABLET | Freq: Two times a day (BID) | ORAL | Status: DC
  Administered 2018-01-17 – 2018-01-19 (×4): 1 via ORAL
  Filled 2018-01-17 (×4): qty 1

## 2018-01-17 MED ORDER — PREDNISONE 50 MG PO TABS
60.0000 mg | ORAL_TABLET | Freq: Every day | ORAL | Status: DC
  Administered 2018-01-18 – 2018-01-19 (×2): 60 mg via ORAL
  Filled 2018-01-17 (×2): qty 1

## 2018-01-17 NOTE — Progress Notes (Signed)
PROGRESS NOTE [facility]   [place]   YTK:[id number] DOB: 30-Nov-1923  DOA: 01/14/2018 PCP: Flossie Buffy, NP   Brief Narrative:  Samantha Zamora is a 84 year old female with past medical history of COPD, chronic respiratory failure with oxygen dependence, dementia and anxiety who presented to the emergency department with altered mental status, fever and increase in urinary frequency.  Upon ED evaluation she was found to be febrile, required BiPAP due to hypoxia and was found to be influenza A+.  Chest x-ray shows hyperinflation with right base patchy ill-defined infiltrate and patient was admitted with working diagnosis of acute on chronic respiratory failure due to COPD exacerbation due to influenza A with superimposed pneumonia of the right lower lobe.  Patient was started on Tamiflu and IV antibiotics.  Physical therapy evaluated patient and recommended SNF for short-term rehab  Subjective: Patient seen and examined, complaining of cough, not able to bring phlegm up.  Oxygen supplementation at baseline.  Remains afebrile.  Denies chest pain and weakness.  Assessment & Plan: Acute on chronic hypoxic and hypercapnic respiratory failure in setting of influenza A with superimposed right lower lobe pneumonia. Initially treated with BiPAP, successfully weaned off.  Oxygen supplementation back to baseline which is 3 L of nasal cannula.  Continue systemic steroids will transition to prednisone in the morning.  Continue Tamiflu to complete 5 days course.  COPD exacerbation with right lower lobe pneumonia 1/2 blood cultures show gram-positive cocci no BCID identified, likely contaminant Patient being treated with Rocephin, will continue for now for superimposed pneumonia.  Will obtain pro-calcitonin pending results will de-escalate antibiotic therapy.  Repeat blood cultures are negative so far. Continue bronchodilator therapy and steroids.  Will add Mucinex for  cough  Metabolic encephalopathy Multifactorial from hypoxic episode and infectious process Patient mentally back to baseline  Deconditioning Likely due to dementia and acute illness.  Physical therapy recommending SNF for short-term rehab  DVT prophylaxis: Lovenox Code Status: DNR Family Communication: None at bedside Disposition Plan: SNF in 2 days, policy of SNF patient needs to complete Tamiflu before SNF admission  Consultants:   None none  Procedures:   None   Antimicrobials: Anti-infectives (From admission, onward)   Start     Dose/Rate Route Frequency Ordered Stop   01/16/18 0800  cefTRIAXone (ROCEPHIN) 1 g in sodium chloride 0.9 % 100 mL IVPB     1 g 200 mL/hr over 30 Minutes Intravenous Every 24 hours 01/16/18 0748     01/15/18 2200  oseltamivir (TAMIFLU) capsule 30 mg     30 mg Oral 2 times daily 01/15/18 1000 01/19/18 0959   01/15/18 1100  azithromycin (ZITHROMAX) 500 mg in sodium chloride 0.9 % 250 mL IVPB  Status:  Discontinued     500 mg 250 mL/hr over 60 Minutes Intravenous Every 24 hours 01/15/18 0950 01/16/18 0748   01/14/18 1015  oseltamivir (TAMIFLU) capsule 75 mg  Status:  Discontinued     75 mg Oral 2 times daily 01/14/18 1014 01/15/18 1000   01/14/18 0815  cefTRIAXone (ROCEPHIN) 1 g in sodium chloride 0.9 % 100 mL IVPB     1 g 200 mL/hr over 30 Minutes Intravenous  Once 01/14/18 0803 01/14/18 0935   01/14/18 0800  oseltamivir (TAMIFLU) capsule 75 mg  Status:  Discontinued     75 mg Oral  Once 01/14/18 0757 01/14/18 1014          Objective: Vitals:   01/17/18 0300 01/17/18 0649 01/17/18 0823 01/17/18 1350  BP:  (!) 161/72    Pulse:  72    Resp:  16    Temp:  98.2 F (36.8 C)    TempSrc:  Oral    SpO2:  100% 98% 98%  Weight: 48.4 kg (106 lb 11.2 oz)     Height:       No intake or output data in the 24 hours ending 01/17/18 1600 Filed Weights   01/15/18 0527 01/16/18 0500 01/17/18 0300  Weight: 49.8 kg (109 lb 12.6 oz) 49.5 kg (109 lb  2 oz) 48.4 kg (106 lb 11.2 oz)    Examination:  General exam: Appears calm and comfortable   HEENT: OP moist and clear Respiratory system: decreased breath sounds RLL, diffuse mild expiratory wheezing, no crackles Cardiovascular system: S1 & S2 heard, RRR. No JVD, murmurs, rubs or gallops Gastrointestinal system: Abdomen is nondistended, soft and nontender.  Central nervous system: Alert and oriented.  Extremities: No pedal edema. Skin: No rashes, lesions or ulcers Psychiatry: Mood & affect appropriate.    Data Reviewed: I have personally reviewed following labs and imaging studies  CBC: Recent Labs  Lab 01/13/18 1605 01/14/18 0756 01/15/18 0135 01/16/18 0213 01/17/18 0702  WBC 6.6 8.5 8.2 5.2 4.6  NEUTROABS 5.1 7.6  --  4.4 3.1  HGB 10.7* 11.1* 9.5* 8.9* 9.4*  HCT 35.5* 38.2 32.5* 30.6* 31.9*  MCV 94.4 96.2 94.8 93.6 93.0  PLT 151 128* 124* 124* 297*   Basic Metabolic Panel: Recent Labs  Lab 01/13/18 1605 01/14/18 0756 01/15/18 0135 01/16/18 0213  NA 136 139 142 142  K 4.2 4.1 4.4 4.4  CL 92* 94* 103 102  CO2 38* 34* 31 31  GLUCOSE 119* 158* 105* 98  BUN 11 10 13 16   CREATININE 0.64 0.70 0.63 0.63  CALCIUM 9.2 9.4 8.5* 9.0   GFR: Estimated Creatinine Clearance: 35 mL/min (by C-G formula based on SCr of 0.63 mg/dL). Liver Function Tests: Recent Labs  Lab 01/14/18 0756  AST 41  ALT 20  ALKPHOS 68  BILITOT 0.8  PROT 6.4*  ALBUMIN 3.4*   No results for input(s): LIPASE, AMYLASE in the last 168 hours. No results for input(s): AMMONIA in the last 168 hours. Coagulation Profile: No results for input(s): INR, PROTIME in the last 168 hours. Cardiac Enzymes: No results for input(s): CKTOTAL, CKMB, CKMBINDEX, TROPONINI in the last 168 hours. BNP (last 3 results) No results for input(s): PROBNP in the last 8760 hours. HbA1C: No results for input(s): HGBA1C in the last 72 hours. CBG: Recent Labs  Lab 01/17/18 0804  GLUCAP 73   Lipid Profile: No  results for input(s): CHOL, HDL, LDLCALC, TRIG, CHOLHDL, LDLDIRECT in the last 72 hours. Thyroid Function Tests: No results for input(s): TSH, T4TOTAL, FREET4, T3FREE, THYROIDAB in the last 72 hours. Anemia Panel: No results for input(s): VITAMINB12, FOLATE, FERRITIN, TIBC, IRON, RETICCTPCT in the last 72 hours. Sepsis Labs: Recent Labs  Lab 01/14/18 1043 01/14/18 1330 01/14/18 1630 01/14/18 1917  LATICACIDVEN 3.35* 1.5 1.8 1.8    Recent Results (from the past 240 hour(s))  Urine culture     Status: Abnormal   Collection Time: 01/13/18  5:48 PM  Result Value Ref Range Status   Specimen Description   Final    URINE, RANDOM Performed at [facility] [address] [place], [place] [address]    Special Requests   Final    NONE Performed at [facility], [address] [place], [place]  [address]    Culture >=100,000 COLONIES/mL STAPHYLOCOCCUS WARNERI (A)  Final   Report Status 01/16/2018 FINAL  Final   Organism ID, Bacteria STAPHYLOCOCCUS WARNERI (A)  Final      Susceptibility   Staphylococcus warneri - MIC*    CIPROFLOXACIN <=0.5 SENSITIVE Sensitive     GENTAMICIN <=0.5 SENSITIVE Sensitive     NITROFURANTOIN <=16 SENSITIVE Sensitive     OXACILLIN 1 RESISTANT Resistant     TETRACYCLINE 2 SENSITIVE Sensitive     VANCOMYCIN 1 SENSITIVE Sensitive     TRIMETH/SULFA <=10 SENSITIVE Sensitive     CLINDAMYCIN <=0.25 SENSITIVE Sensitive     RIFAMPIN <=0.5 SENSITIVE Sensitive     Inducible Clindamycin NEGATIVE Sensitive     * >=100,000 COLONIES/mL STAPHYLOCOCCUS WARNERI  Blood Culture (routine x 2)     Status: None (Preliminary result)   Collection Time: 01/14/18  8:20 AM  Result Value Ref Range Status   Specimen Description BLOOD RIGHT FOREARM  Final   Special Requests   Final    BOTTLES DRAWN AEROBIC AND ANAEROBIC Blood Culture adequate volume   Culture   Final    NO GROWTH 3 DAYS Performed at [facility], [address],  [place], [place] [address]    Report Status PENDING  Incomplete  Blood Culture (routine x 2)     Status: None (Preliminary result)   Collection Time: 01/14/18  8:32 AM  Result Value Ref Range Status   Specimen Description BLOOD RIGHT WRIST  Final   Special Requests IN PEDIATRIC BOTTLE Blood Culture adequate volume  Final   Culture  Setup Time   Final    GRAM POSITIVE COCCI IN PEDIATRIC BOTTLE CRITICAL RESULT CALLED TO, READ BACK BY AND VERIFIED WITH: J [facility] 01/15/18 2352 JDW    Culture   Final    GRAM POSITIVE COCCI CULTURE REINCUBATED FOR BETTER GROWTH Performed at [facility], [address], [place], [place] [address]    Report Status PENDING  Incomplete  Blood Culture ID Panel (Reflexed)     Status: None   Collection Time: 01/14/18  8:32 AM  Result Value Ref Range Status   Enterococcus species NOT DETECTED NOT DETECTED Final    Comment: CRITICAL RESULT CALLED TO, READ BACK BY AND VERIFIED WITH: J [facility] PHARMD 01/15/18 2352 JDW    Listeria monocytogenes NOT DETECTED NOT DETECTED Final   Staphylococcus species NOT DETECTED NOT DETECTED Final   Staphylococcus aureus NOT DETECTED NOT DETECTED Final   Streptococcus species NOT DETECTED NOT DETECTED Final   Streptococcus agalactiae NOT DETECTED NOT DETECTED Final   Streptococcus pneumoniae NOT DETECTED NOT DETECTED Final   Streptococcus pyogenes NOT DETECTED NOT DETECTED Final   Acinetobacter baumannii NOT DETECTED NOT DETECTED Final   Enterobacteriaceae species NOT DETECTED NOT DETECTED Final   Enterobacter cloacae complex NOT DETECTED NOT DETECTED Final   Escherichia coli NOT DETECTED NOT DETECTED Final   Klebsiella oxytoca NOT DETECTED NOT DETECTED Final   Klebsiella pneumoniae NOT DETECTED NOT DETECTED Final   Proteus species NOT DETECTED NOT DETECTED Final   Serratia marcescens NOT DETECTED NOT DETECTED Final   Haemophilus influenzae NOT DETECTED NOT DETECTED Final   Neisseria meningitidis NOT DETECTED NOT  DETECTED Final   Pseudomonas aeruginosa NOT DETECTED NOT DETECTED Final   Candida albicans NOT DETECTED NOT DETECTED Final   Candida glabrata NOT DETECTED NOT DETECTED Final   Candida krusei NOT DETECTED NOT DETECTED Final   Candida parapsilosis NOT DETECTED NOT DETECTED Final  Candida tropicalis NOT DETECTED NOT DETECTED Final  MRSA PCR Screening     Status: None   Collection Time: 01/14/18  6:36 PM  Result Value Ref Range Status   MRSA by PCR NEGATIVE NEGATIVE Final    Comment:        The GeneXpert MRSA Assay (FDA approved for NASAL specimens only), is one component of a comprehensive MRSA colonization surveillance program. It is not intended to diagnose MRSA infection nor to guide or monitor treatment for MRSA infections. Performed at [facility], [place] [address] [place], [place] [address]   Culture, blood (routine x 2)     Status: None (Preliminary result)   Collection Time: 01/16/18  8:30 AM  Result Value Ref Range Status   Specimen Description BLOOD RIGHT ANTECUBITAL  Final   Special Requests   Final    BOTTLES DRAWN AEROBIC AND ANAEROBIC Blood Culture adequate volume   Culture   Final    NO GROWTH 1 DAY Performed at [facility], [place] [address], [place], [place] [address]    Report Status PENDING  Incomplete  Culture, blood (routine x 2)     Status: None (Preliminary result)   Collection Time: 01/16/18  8:45 AM  Result Value Ref Range Status   Specimen Description BLOOD RIGHT HAND  Final   Special Requests   Final    BOTTLES DRAWN AEROBIC AND ANAEROBIC Blood Culture adequate volume   Culture   Final    NO GROWTH 1 DAY Performed at [facility], [place] [address] [place], [place] [address]    Report Status PENDING  Incomplete     Radiology Studies: No results found.   Scheduled Meds: . aspirin EC  81 mg Oral Daily  . dextromethorphan-guaiFENesin  1 tablet Oral BID  . enoxaparin (LOVENOX) injection  30 mg Subcutaneous Q24H  .  ipratropium-albuterol  3 mL Nebulization TID  . methylPREDNISolone (SOLU-MEDROL) injection  40 mg Intravenous Daily  . oseltamivir  30 mg Oral BID  . [START ON 01/18/2018] predniSONE  60 mg Oral Q breakfast   Continuous Infusions: . cefTRIAXone (ROCEPHIN)  IV Stopped (01/17/18 0820)     LOS: 3 days    Time spent: Total of 25 minutes spent with pt, greater than 50% of which was spent in discussion of  treatment, counseling and coordination of care    Chipper Oman, MD Pager: Text Page via [email or web address]   If 7PM-7AM, please contact night-coverage [email or web address] 01/17/2018, 4:00 PM   Note - This record has been created using [employer]. Chart creation errors have been sought, but may not always have been located. Such creation errors do not reflect on the standard of medical care.

## 2018-01-17 NOTE — Plan of Care (Signed)
  Progressing Education: Knowledge of General Education information will improve 01/17/2018 0758 - Progressing by Verne Grain, RN Health Behavior/Discharge Planning: Ability to manage health-related needs will improve 01/17/2018 0758 - Progressing by Verne Grain, RN

## 2018-01-17 NOTE — Telephone Encounter (Signed)
Post ED Visit - Positive Culture Follow-up  Culture report reviewed by antimicrobial stewardship pharmacist:  [x]  Elenor Quinones, Pharm.D. []  Heide Guile, Pharm.D., BCPS AQ-ID []  Parks Neptune, Pharm.D., BCPS []  Alycia Rossetti, Pharm.D., BCPS []  [place], Pharm.D., BCPS, AAHIVP []  Legrand Como, Pharm.D., BCPS, AAHIVP []  Salome Arnt, PharmD, BCPS []  Jalene Mullet, PharmD []  Vincenza Hews, PharmD, BCPS  Positive urine culture Treated with Cephalexin, current inpatient on 5W @ [address]  Hazle Nordmann 01/17/2018, 9:41 AM

## 2018-01-17 NOTE — Clinical Social Work Note (Signed)
Clinical Social Work Assessment  Patient Details  Name: Samantha Zamora MRN: [id number] Date of Birth: 12-01-1923  Date of referral:  01/17/18               Reason for consult:  Facility Placement                Permission sought to share information with:  Facility Sport and exercise psychologist, Family Supports Permission granted to share information::  Yes, Verbal Permission Granted  Name::     Jerome/Laseanda  Agency::  SNFs  Relationship::  Son/DIL  Contact Information:  [phone]  Housing/Transportation Living arrangements for the past 2 months:  Single Family Home Source of Information:  Adult Children Patient Interpreter Needed:  None Criminal Activity/Legal Involvement Pertinent to Current Situation/Hospitalization:  No - Comment as needed Significant Relationships:  Adult Children Lives with:  Adult Children Do you feel safe going back to the place where you live?  Yes Need for family participation in patient care:  Yes (Comment)  Care giving concerns:  CSW received consult for possible SNF placement at time of discharge. CSW spoke with patient's son and daughter in law regarding PT recommendation of SNF placement at time of discharge. Patient's son reported that patient lives with them. CSW to continue to follow and assist with discharge planning needs.   Social Worker assessment / plan:  CSW spoke with patient's son concerning possibility of rehab at [facility] before returning home.  Employment status:  Retired Forensic scientist:  Medicare PT Recommendations:  [place] / Referral to community resources:  [place]  Patient/Family's Response to care:  Patient's son reported that he does not really want patient placed in a facility. CSW introduced concept of [facility] as well. He reported that his wife would be at the hospital later today to discuss the options with CSW.   Patient/Family's Understanding of and Emotional Response  to Diagnosis, Current Treatment, and Prognosis:  Patient/family is realistic regarding therapy needs and expressed being hopeful for return home. Patient's son expressed understanding of CSW role and discharge process as well as medical condition. No questions/concerns about plan or treatment.    Emotional Assessment Appearance:  Appears stated age Attitude/Demeanor/Rapport:  Unable to Assess Affect (typically observed):  Unable to Assess Orientation:  Oriented to Self, Oriented to Place Alcohol / Substance use:  Not Applicable Psych involvement (Current and /or in the community):  No (Comment)  Discharge Needs  Concerns to be addressed:  Care Coordination Readmission within the last 30 days:  No Current discharge risk:  None Barriers to Discharge:  Continued Medical Work up   [employer], [place] 01/17/2018, 12:01 PM

## 2018-01-17 NOTE — Care Management Note (Signed)
Case Management Note  Patient Details  Name: Rozlynn Lippold MRN: [id number] Date of Birth: [birth year]  Subjective/Objective:      Influenza, UTI. From home with son. Son states works from home however mom is unaware of it and states he has a camera in [employer] bedroom.           PCP;  Linda Hedges  Action/Plan: Transition to SNF vs home with home health services.  NCM spoke with son regarding d/c disposition needs, and [facility] program shared. Son stated wife will be @ bedside today to speak with NCM/CSW regarding disposition plans for pt.  Expected Discharge Date:  01/17/18               Expected Discharge Plan:  [place]  In-House Referral:  Clinical Social Work  Discharge planning Services  CM Consult  Post Acute Care Choice:    Choice offered to:  Adult Children  DME Arranged:  [facility] DME Agency:  [place]  HH Arranged:    [place] Agency:     Status of Service:  In process, will continue to follow  If discussed at Long Length of Stay Meetings, dates discussed:    Additional Comments:  Sharin Mons, RN 01/17/2018, 12:26 PM

## 2018-01-18 LAB — CULTURE, BLOOD (ROUTINE X 2): Special Requests: ADEQUATE

## 2018-01-18 MED ORDER — AMOXICILLIN-POT CLAVULANATE 875-125 MG PO TABS
1.0000 | ORAL_TABLET | Freq: Two times a day (BID) | ORAL | Status: DC
  Administered 2018-01-18 – 2018-01-19 (×2): 1 via ORAL
  Filled 2018-01-18 (×2): qty 1

## 2018-01-18 NOTE — Progress Notes (Addendum)
PROGRESS NOTE [facility]   [place]   NFA:[id number] DOB: [birth year]  DOA: 01/14/2018 PCP: Flossie Buffy, NP   Brief Narrative:  Samantha Zamora is a 84 year old female with past medical history of COPD, chronic respiratory failure with oxygen dependence, dementia and anxiety who presented to the emergency department with altered mental status, fever and increase in urinary frequency.  Upon ED evaluation she was found to be febrile, required BiPAP due to hypoxia and was found to be influenza A+.  Chest x-ray shows hyperinflation with right base patchy ill-defined infiltrate and patient was admitted with working diagnosis of acute on chronic respiratory failure due to COPD exacerbation due to influenza A with superimposed pneumonia of the right lower lobe.  Patient was started on Tamiflu and IV antibiotics.  Physical therapy evaluated patient and recommended SNF for short-term rehab  Subjective: Patient seen and examined, she continues to improved, report cough has decrease and feels that can breath better. No new complaints. Patient awaiting to complete tamiflu therapy for d/c.   Assessment & Plan: Acute on chronic hypoxic and hypercapnic respiratory failure in setting of influenza A with superimposed right lower lobe pneumonia. Initially treated with BiPAP, successfully weaned off.  Oxygen supplementation back to baseline which is 3 L of nasal cannula.  Continue systemic steroids will transition to prednisone in the morning.  Continue Tamiflu to complete 5 days, this will be done tonight.   COPD exacerbation with right lower lobe pneumonia 1/2 blood cultures show gram-positive cocci no BCID identified, likely contaminant Patient initially treated with Rocephin, will switch to Augmentin to complete 7 day of oral abx. PTC 0.20 complete abx therapy.  Repeat blood cultures are negative so far. Continue bronchodilator therapy and steroids. Prednisone taper for 14 days.  Continue Mucinex for cough  Metabolic encephalopathy - resolved  Multifactorial from hypoxic episode and infectious process Patient mentally back to baseline  Deconditioning Likely due to dementia and acute illness.  Physical therapy recommending SNF for short-term rehab  DVT prophylaxis: Lovenox Code Status: DNR Family Communication: None at bedside Disposition Plan: SNF in AM, policy of SNF patient needs to complete Tamiflu before SNF admission  Consultants:   None none  Procedures:   None   Antimicrobials: Anti-infectives (From admission, onward)   Start     Dose/Rate Route Frequency Ordered Stop   01/16/18 0800  cefTRIAXone (ROCEPHIN) 1 g in sodium chloride 0.9 % 100 mL IVPB     1 g 200 mL/hr over 30 Minutes Intravenous Every 24 hours 01/16/18 0748     01/15/18 2200  oseltamivir (TAMIFLU) capsule 30 mg     30 mg Oral 2 times daily 01/15/18 1000 01/19/18 0959   01/15/18 1100  azithromycin (ZITHROMAX) 500 mg in sodium chloride 0.9 % 250 mL IVPB  Status:  Discontinued     500 mg 250 mL/hr over 60 Minutes Intravenous Every 24 hours 01/15/18 0950 01/16/18 0748   01/14/18 1015  oseltamivir (TAMIFLU) capsule 75 mg  Status:  Discontinued     75 mg Oral 2 times daily 01/14/18 1014 01/15/18 1000   01/14/18 0815  cefTRIAXone (ROCEPHIN) 1 g in sodium chloride 0.9 % 100 mL IVPB     1 g 200 mL/hr over 30 Minutes Intravenous  Once 01/14/18 0803 01/14/18 0935   01/14/18 0800  oseltamivir (TAMIFLU) capsule 75 mg  Status:  Discontinued     75 mg Oral  Once 01/14/18 0757 01/14/18 1014         Objective: Vitals:  01/18/18 0510 01/18/18 0842 01/18/18 1222 01/18/18 1438  BP: (!) 157/68  (!) 150/65   Pulse: 63 76 76 75  Resp: _0 Temp: 97.8 F (36.6 C)  97.9 F (36.6 C)   TempSrc:   Oral   SpO2: 100% 98% 99% 99%  Weight: 48 kg (105 lb 14.4 oz)     Height:        Intake/Output Summary (Last 24 hours) at 01/18/2018 1627 Last data filed at 01/18/2018 1100 Gross per 24  hour  Intake -  Output 1700 ml  Net -1700 ml   Filed Weights   01/16/18 0500 01/17/18 0300 01/18/18 0510  Weight: 49.5 kg (109 lb 2 oz) 48.4 kg (106 lb 11.2 oz) 48 kg (105 lb 14.4 oz)    Examination:  General: Pt is alert, awake, not in acute distress Cardiovascular: RRR, S1/S2 +, no rubs, no gallops Respiratory: Air entry improved, mild rhonchi in th RLL  Abdominal: Soft, NT, ND Extremities: no edema   Data Reviewed: I have personally reviewed following labs and imaging studies  CBC: Recent Labs  Lab 01/13/18 1605 01/14/18 0756 01/15/18 0135 01/16/18 0213 01/17/18 0702  WBC 6.6 8.5 8.2 5.2 4.6  NEUTROABS 5.1 7.6  --  4.4 3.1  HGB 10.7* 11.1* 9.5* 8.9* 9.4*  HCT 35.5* 38.2 32.5* 30.6* 31.9*  MCV 94.4 96.2 94.8 93.6 93.0  PLT 151 128* 124* 124* 001*   Basic Metabolic Panel: Recent Labs  Lab 01/13/18 1605 01/14/18 0756 01/15/18 0135 01/16/18 0213  NA 136 139 142 142  K 4.2 4.1 4.4 4.4  CL 92* 94* 103 102  CO2 38* 34* 31 31  GLUCOSE 119* 158* 105* 98  BUN _1 CREATININE 0.64 0.70 0.63 0.63  CALCIUM 9.2 9.4 8.5* 9.0   GFR: Estimated Creatinine Clearance: 34.7 mL/min (by C-G formula based on SCr of 0.63 mg/dL). Liver Function Tests: Recent Labs  Lab 01/14/18 0756  AST 41  ALT 20  ALKPHOS 68  BILITOT 0.8  PROT 6.4*  ALBUMIN 3.4*   No results for input(s): LIPASE, AMYLASE in the last 168 hours. No results for input(s): AMMONIA in the last 168 hours. Coagulation Profile: No results for input(s): INR, PROTIME in the last 168 hours. Cardiac Enzymes: No results for input(s): CKTOTAL, CKMB, CKMBINDEX, TROPONINI in the last 168 hours. BNP (last 3 results) No results for input(s): PROBNP in the last 8760 hours. HbA1C: No results for input(s): HGBA1C in the last 72 hours. CBG: Recent Labs  Lab 01/17/18 0804  GLUCAP 73   Lipid Profile: No results for input(s): CHOL, HDL, LDLCALC, TRIG, CHOLHDL, LDLDIRECT in the last 72 hours. Thyroid  Function Tests: No results for input(s): TSH, T4TOTAL, FREET4, T3FREE, THYROIDAB in the last 72 hours. Anemia Panel: No results for input(s): VITAMINB12, FOLATE, FERRITIN, TIBC, IRON, RETICCTPCT in the last 72 hours. Sepsis Labs: Recent Labs  Lab 01/14/18 1043 01/14/18 1330 01/14/18 1630 01/14/18 1917 01/17/18 1722  PROCALCITON  --   --   --   --  0.20  LATICACIDVEN 3.35* 1.5 1.8 1.8  --     Recent Results (from the past 240 hour(s))  Urine culture     Status: Abnormal   Collection Time: 01/13/18  5:48 PM  Result Value Ref Range Status   Specimen Description   Final    URINE, RANDOM Performed at [facility], [place] [address], [place], [place] [address]    Special Requests  Final    NONE Performed at [facility], [place] [address], [place], [place] [address]    Culture >=100,000 COLONIES/mL STAPHYLOCOCCUS WARNERI (A)  Final   Report Status 01/16/2018 FINAL  Final   Organism ID, Bacteria STAPHYLOCOCCUS WARNERI (A)  Final      Susceptibility   Staphylococcus warneri - MIC*    CIPROFLOXACIN <=0.5 SENSITIVE Sensitive     GENTAMICIN <=0.5 SENSITIVE Sensitive     NITROFURANTOIN <=16 SENSITIVE Sensitive     OXACILLIN 1 RESISTANT Resistant     TETRACYCLINE 2 SENSITIVE Sensitive     VANCOMYCIN 1 SENSITIVE Sensitive     TRIMETH/SULFA <=10 SENSITIVE Sensitive     CLINDAMYCIN <=0.25 SENSITIVE Sensitive     RIFAMPIN <=0.5 SENSITIVE Sensitive     Inducible Clindamycin NEGATIVE Sensitive     * >=100,000 COLONIES/mL STAPHYLOCOCCUS WARNERI  Blood Culture (routine x 2)     Status: None (Preliminary result)   Collection Time: 01/14/18  8:20 AM  Result Value Ref Range Status   Specimen Description BLOOD RIGHT FOREARM  Final   Special Requests   Final    BOTTLES DRAWN AEROBIC AND ANAEROBIC Blood Culture adequate volume   Culture   Final    NO GROWTH 3 DAYS Performed at [facility], [address] [place], [place] [address]    Report Status  PENDING  Incomplete  Blood Culture (routine x 2)     Status: Abnormal   Collection Time: 01/14/18  8:32 AM  Result Value Ref Range Status   Specimen Description BLOOD RIGHT WRIST  Final   Special Requests IN PEDIATRIC BOTTLE Blood Culture adequate volume  Final   Culture  Setup Time   Final    GRAM POSITIVE COCCI IN PEDIATRIC BOTTLE CRITICAL RESULT CALLED TO, READ BACK BY AND VERIFIED WITH: J LEDFORD PHARMD 01/15/18 2352 JDW    Culture (A)  Final    MICROCOCCUS SPECIES Standardized susceptibility testing for this organism is not available. Performed at [facility], [place] [address] [place], [place] [address]    Report Status 01/18/2018 FINAL  Final  Blood Culture ID Panel (Reflexed)     Status: None   Collection Time: 01/14/18  8:32 AM  Result Value Ref Range Status   Enterococcus species NOT DETECTED NOT DETECTED Final    Comment: CRITICAL RESULT CALLED TO, READ BACK BY AND VERIFIED WITH: J LEDFORD PHARMD 01/15/18 2352 JDW    Listeria monocytogenes NOT DETECTED NOT DETECTED Final   Staphylococcus species NOT DETECTED NOT DETECTED Final   Staphylococcus aureus NOT DETECTED NOT DETECTED Final   Streptococcus species NOT DETECTED NOT DETECTED Final   Streptococcus agalactiae NOT DETECTED NOT DETECTED Final   Streptococcus pneumoniae NOT DETECTED NOT DETECTED Final   Streptococcus pyogenes NOT DETECTED NOT DETECTED Final   Acinetobacter baumannii NOT DETECTED NOT DETECTED Final   Enterobacteriaceae species NOT DETECTED NOT DETECTED Final   Enterobacter cloacae complex NOT DETECTED NOT DETECTED Final   Escherichia coli NOT DETECTED NOT DETECTED Final   Klebsiella oxytoca NOT DETECTED NOT DETECTED Final   Klebsiella pneumoniae NOT DETECTED NOT DETECTED Final   Proteus species NOT DETECTED NOT DETECTED Final   Serratia marcescens NOT DETECTED NOT DETECTED Final   Haemophilus influenzae NOT DETECTED NOT DETECTED Final   Neisseria meningitidis NOT DETECTED NOT DETECTED Final    Pseudomonas aeruginosa NOT DETECTED NOT DETECTED Final   Candida albicans NOT DETECTED NOT DETECTED Final   Candida glabrata NOT DETECTED NOT DETECTED  Final   Candida krusei NOT DETECTED NOT DETECTED Final   Candida parapsilosis NOT DETECTED NOT DETECTED Final   Candida tropicalis NOT DETECTED NOT DETECTED Final  MRSA PCR Screening     Status: None   Collection Time: 01/14/18  6:36 PM  Result Value Ref Range Status   MRSA by PCR NEGATIVE NEGATIVE Final    Comment:        The GeneXpert MRSA Assay (FDA approved for NASAL specimens only), is one component of a comprehensive MRSA colonization surveillance program. It is not intended to diagnose MRSA infection nor to guide or monitor treatment for MRSA infections. Performed at [facility], [place] [address], [place], [place] [address]   Culture, blood (routine x 2)     Status: None (Preliminary result)   Collection Time: 01/16/18  8:30 AM  Result Value Ref Range Status   Specimen Description BLOOD RIGHT ANTECUBITAL  Final   Special Requests   Final    BOTTLES DRAWN AEROBIC AND ANAEROBIC Blood Culture adequate volume   Culture   Final    NO GROWTH 1 DAY Performed at [facility], [place] [address], [place], [place] [address]    Report Status PENDING  Incomplete  Culture, blood (routine x 2)     Status: None (Preliminary result)   Collection Time: 01/16/18  8:45 AM  Result Value Ref Range Status   Specimen Description BLOOD RIGHT HAND  Final   Special Requests   Final    BOTTLES DRAWN AEROBIC AND ANAEROBIC Blood Culture adequate volume   Culture   Final    NO GROWTH 1 DAY Performed at [facility], [place] [address], [place], [place] [address]    Report Status PENDING  Incomplete     Radiology Studies: No results found.   Scheduled Meds: . aspirin EC  81 mg Oral Daily  . dextromethorphan-guaiFENesin  1 tablet Oral BID  . enoxaparin (LOVENOX) injection  30 mg Subcutaneous Q24H  . ipratropium-albuterol  3 mL  Nebulization TID  . oseltamivir  30 mg Oral BID  . predniSONE  60 mg Oral Q breakfast   Continuous Infusions: . cefTRIAXone (ROCEPHIN)  IV Stopped (01/18/18 0950)     LOS: 4 days    Time spent: Total of 25 minutes spent with pt, greater than 50% of which was spent in discussion of  treatment, counseling and coordination of care    Chipper Oman, MD Pager: Text Page via [email or web address]   If 7PM-7AM, please contact night-coverage [email or web address] 01/18/2018, 4:27 PM   Note - This record has been created using [employer]. Chart creation errors have been sought, but may not always have been located. Such creation errors do not reflect on the standard of medical care.

## 2018-01-18 NOTE — Progress Notes (Signed)
Left vm for son and daughter-in-law regarding SNF choice.  Percell Locus Lillar Bianca LCSW [phone]

## 2018-01-18 NOTE — Progress Notes (Signed)
Pt has order for BIPAP as needed. BIPAP on 3L in no respiratory distress. RT to cont top monitor. BIPAP not in room at this time.

## 2018-01-18 NOTE — Consult Note (Signed)
   [facility] CM Inpatient Consult   01/18/2018  Niasia Lanphear [birth year] [id number]  Chart reviewed and current disposition is for the patient to go to a skilled nursing facility for rehab. Patient in the Medicare ACO.  Chart review reveals patient lives with son. Patient currently with UTI, and influenza.  Inpatient care management staff assessed for [facility] program.  Currently, the patient's daughter in law and son are seeking SNF placement. Will continue to follow for disposition and needs.  Natividad Brood, RN BSN [facility] Liaison  [phone] business mobile phone Toll free office [phone]

## 2018-01-18 NOTE — Progress Notes (Signed)
Physical Therapy Treatment Patient Details Name: Samantha Zamora MRN: [id number] DOB: [birth year] Today's Date: 01/18/2018    History of Present Illness 84 year old female who presents with altered mental status. She does have significant past medical history of COPD, chronic hypoxic respiratory failure, dementia and anxiety. For the last 2 days prior to hospitalization she was found to have worsening confusion, fever and increased urinary frequency. Was diagnosed with influenza and urinary tract infection, antibiotics were prescribed, she was unable to fill his prescriptions, on the day of admission she was found unresponsive. On initial physical examination temperature 100.2, heart rate 111, respiratory rate 18, oxygen saturation 100%, on BiPAP.   Pt positive for flu.    PT Comments    Pt admitted with above diagnosis. Pt currently with functional limitations due to the deficits listed below (see PT Problem List). Pt was able to ambulate in room a short distance.  Poor safety awareness and poor awareness of posture.  Pt needed assist and cues.  Pt will benefit from skilled PT to increase their independence and safety with mobility to allow discharge to the venue listed below.     Follow Up Recommendations  SNF;Supervision/Assistance - 24 hour     Equipment Recommendations  None recommended by PT    Recommendations for Other Services       Precautions / Restrictions Precautions Precautions: Fall Restrictions Weight Bearing Restrictions: No    Mobility  Bed Mobility               General bed mobility comments: in chair on arrival  Transfers Overall transfer level: Needs assistance Equipment used: Rolling walker (2 wheeled) Transfers: Sit to/from Stand Sit to Stand: Min assist         General transfer comment: asssit to power up and steady once up  Ambulation/Gait Ambulation/Gait assistance: Min assist Ambulation Distance (Feet): 40 Feet Assistive device: Rolling  walker (2 wheeled) Gait Pattern/deviations: Step-through pattern;Decreased stride length;Staggering left;Staggering right;Drifts right/left;Wide base of support   Gait velocity interpretation: Below normal speed for age/gender General Gait Details: Pt was able to ambulate in room needing cues to sequence steps and RW. Steering assist for RW as well with pt with overall decr postural stability. Incr trunk flexion   Stairs            Wheelchair Mobility    Modified Rankin (Stroke Patients Only)       Balance           Standing balance support: Bilateral upper extremity supported;During functional activity Standing balance-Leahy Scale: Poor Standing balance comment: relies on RW for balance and external support needed.                            Cognition Arousal/Alertness: Awake/alert Behavior During Therapy: WFL for tasks assessed/performed Overall Cognitive Status: History of cognitive impairments - at baseline Area of Impairment: Orientation;Memory;Following commands;Safety/judgement;Awareness;Problem solving                 Orientation Level: Disoriented to;Time;Situation(knew she was in hospital )     Following Commands: Follows one step commands with increased time Safety/Judgement: Decreased awareness of safety;Decreased awareness of deficits   Problem Solving: Decreased initiation;Difficulty sequencing;Requires verbal cues;Requires tactile cues General Comments: dementia      Exercises      General Comments        Pertinent Vitals/Pain Pain Assessment: No/denies pain    Home Living  Prior Function            PT Goals (current goals can now be found in the care plan section) Acute Rehab PT Goals Patient Stated Goal: to gohome Progress towards PT goals: Progressing toward goals    Frequency    Min 3X/week      PT Plan Current plan remains appropriate    Co-evaluation               AM-PAC PT "6 Clicks" Daily Activity  Outcome Measure  Difficulty turning over in bed (including adjusting bedclothes, sheets and blankets)?: Unable Difficulty moving from lying on back to sitting on the side of the bed? : Unable Difficulty sitting down on and standing up from a chair with arms (e.g., wheelchair, bedside commode, etc,.)?: A Little Help needed moving to and from a bed to chair (including a wheelchair)?: A Little Help needed walking in hospital room?: A Lot Help needed climbing 3-5 steps with a railing? : Total 6 Click Score: 11    End of Session Equipment Utilized During Treatment: Gait belt;Oxygen Activity Tolerance: Patient limited by fatigue Patient left: in chair;with call bell/phone within reach;with chair alarm set Nurse Communication: Mobility status PT Visit Diagnosis: Muscle weakness (generalized) (M62.81);History of falling (Z91.81);Unsteadiness on feet (R26.81)     Time: 1250-1309 PT Time Calculation (min) (ACUTE ONLY): 19 min  Charges:  $Gait Training: 8-22 mins                    G Codes:       Edwards Mckelvie,PT Acute Rehabilitation [phone] (pager)    Denice Paradise 01/18/2018, 1:56 PM

## 2018-01-19 DIAGNOSIS — G9340 Encephalopathy, unspecified: Secondary | ICD-10-CM | POA: Diagnosis not present

## 2018-01-19 DIAGNOSIS — Z79899 Other long term (current) drug therapy: Secondary | ICD-10-CM | POA: Diagnosis not present

## 2018-01-19 DIAGNOSIS — G9341 Metabolic encephalopathy: Secondary | ICD-10-CM | POA: Diagnosis present

## 2018-01-19 DIAGNOSIS — R2681 Unsteadiness on feet: Secondary | ICD-10-CM | POA: Diagnosis not present

## 2018-01-19 DIAGNOSIS — Z66 Do not resuscitate: Secondary | ICD-10-CM | POA: Diagnosis present

## 2018-01-19 DIAGNOSIS — E119 Type 2 diabetes mellitus without complications: Secondary | ICD-10-CM | POA: Diagnosis not present

## 2018-01-19 DIAGNOSIS — M810 Age-related osteoporosis without current pathological fracture: Secondary | ICD-10-CM | POA: Diagnosis present

## 2018-01-19 DIAGNOSIS — R4182 Altered mental status, unspecified: Secondary | ICD-10-CM | POA: Diagnosis not present

## 2018-01-19 DIAGNOSIS — R0902 Hypoxemia: Secondary | ICD-10-CM | POA: Diagnosis not present

## 2018-01-19 DIAGNOSIS — J9610 Chronic respiratory failure, unspecified whether with hypoxia or hypercapnia: Secondary | ICD-10-CM | POA: Diagnosis not present

## 2018-01-19 DIAGNOSIS — D649 Anemia, unspecified: Secondary | ICD-10-CM | POA: Diagnosis present

## 2018-01-19 DIAGNOSIS — R41841 Cognitive communication deficit: Secondary | ICD-10-CM | POA: Diagnosis not present

## 2018-01-19 DIAGNOSIS — N390 Urinary tract infection, site not specified: Secondary | ICD-10-CM | POA: Diagnosis not present

## 2018-01-19 DIAGNOSIS — F0391 Unspecified dementia with behavioral disturbance: Secondary | ICD-10-CM | POA: Diagnosis not present

## 2018-01-19 DIAGNOSIS — R05 Cough: Secondary | ICD-10-CM | POA: Diagnosis not present

## 2018-01-19 DIAGNOSIS — F0390 Unspecified dementia without behavioral disturbance: Secondary | ICD-10-CM | POA: Diagnosis not present

## 2018-01-19 DIAGNOSIS — J101 Influenza due to other identified influenza virus with other respiratory manifestations: Secondary | ICD-10-CM

## 2018-01-19 DIAGNOSIS — M6389 Disorders of muscle in diseases classified elsewhere, multiple sites: Secondary | ICD-10-CM | POA: Diagnosis not present

## 2018-01-19 DIAGNOSIS — J449 Chronic obstructive pulmonary disease, unspecified: Secondary | ICD-10-CM | POA: Diagnosis not present

## 2018-01-19 DIAGNOSIS — R278 Other lack of coordination: Secondary | ICD-10-CM | POA: Diagnosis not present

## 2018-01-19 DIAGNOSIS — J9622 Acute and chronic respiratory failure with hypercapnia: Secondary | ICD-10-CM | POA: Diagnosis not present

## 2018-01-19 DIAGNOSIS — J9601 Acute respiratory failure with hypoxia: Secondary | ICD-10-CM | POA: Diagnosis not present

## 2018-01-19 DIAGNOSIS — M6281 Muscle weakness (generalized): Secondary | ICD-10-CM | POA: Diagnosis not present

## 2018-01-19 DIAGNOSIS — R402441 Other coma, without documented Glasgow coma scale score, or with partial score reported, in the field [EMT or ambulance]: Secondary | ICD-10-CM | POA: Diagnosis not present

## 2018-01-19 DIAGNOSIS — Z9981 Dependence on supplemental oxygen: Secondary | ICD-10-CM | POA: Diagnosis not present

## 2018-01-19 DIAGNOSIS — Z85038 Personal history of other malignant neoplasm of large intestine: Secondary | ICD-10-CM | POA: Diagnosis not present

## 2018-01-19 DIAGNOSIS — J9611 Chronic respiratory failure with hypoxia: Secondary | ICD-10-CM | POA: Diagnosis not present

## 2018-01-19 DIAGNOSIS — Z87891 Personal history of nicotine dependence: Secondary | ICD-10-CM | POA: Diagnosis not present

## 2018-01-19 DIAGNOSIS — D696 Thrombocytopenia, unspecified: Secondary | ICD-10-CM | POA: Diagnosis not present

## 2018-01-19 DIAGNOSIS — J09X2 Influenza due to identified novel influenza A virus with other respiratory manifestations: Secondary | ICD-10-CM | POA: Diagnosis not present

## 2018-01-19 DIAGNOSIS — Z7982 Long term (current) use of aspirin: Secondary | ICD-10-CM | POA: Diagnosis not present

## 2018-01-19 DIAGNOSIS — E872 Acidosis: Secondary | ICD-10-CM | POA: Diagnosis present

## 2018-01-19 DIAGNOSIS — M25511 Pain in right shoulder: Secondary | ICD-10-CM | POA: Diagnosis not present

## 2018-01-19 DIAGNOSIS — R319 Hematuria, unspecified: Secondary | ICD-10-CM | POA: Diagnosis not present

## 2018-01-19 DIAGNOSIS — K219 Gastro-esophageal reflux disease without esophagitis: Secondary | ICD-10-CM | POA: Diagnosis present

## 2018-01-19 DIAGNOSIS — J80 Acute respiratory distress syndrome: Secondary | ICD-10-CM | POA: Diagnosis not present

## 2018-01-19 DIAGNOSIS — E875 Hyperkalemia: Secondary | ICD-10-CM | POA: Diagnosis present

## 2018-01-19 DIAGNOSIS — J9600 Acute respiratory failure, unspecified whether with hypoxia or hypercapnia: Secondary | ICD-10-CM | POA: Diagnosis not present

## 2018-01-19 DIAGNOSIS — J9621 Acute and chronic respiratory failure with hypoxia: Secondary | ICD-10-CM | POA: Diagnosis not present

## 2018-01-19 DIAGNOSIS — J441 Chronic obstructive pulmonary disease with (acute) exacerbation: Secondary | ICD-10-CM | POA: Diagnosis not present

## 2018-01-19 DIAGNOSIS — R079 Chest pain, unspecified: Secondary | ICD-10-CM | POA: Diagnosis not present

## 2018-01-19 DIAGNOSIS — I10 Essential (primary) hypertension: Secondary | ICD-10-CM | POA: Diagnosis not present

## 2018-01-19 DIAGNOSIS — J189 Pneumonia, unspecified organism: Secondary | ICD-10-CM | POA: Diagnosis present

## 2018-01-19 DIAGNOSIS — S4991XA Unspecified injury of right shoulder and upper arm, initial encounter: Secondary | ICD-10-CM | POA: Diagnosis not present

## 2018-01-19 DIAGNOSIS — N179 Acute kidney failure, unspecified: Secondary | ICD-10-CM | POA: Diagnosis not present

## 2018-01-19 LAB — CULTURE, BLOOD (ROUTINE X 2)
Culture: NO GROWTH
Special Requests: ADEQUATE

## 2018-01-19 MED ORDER — AMOXICILLIN-POT CLAVULANATE 875-125 MG PO TABS: 1.0000 | ORAL_TABLET | Freq: Two times a day (BID) | ORAL | 0 refills | Status: DC

## 2018-01-19 MED ORDER — IPRATROPIUM-ALBUTEROL 0.5-2.5 (3) MG/3ML IN SOLN: 3.0000 mL | Freq: Four times a day (QID) | RESPIRATORY_TRACT | Status: AC | PRN

## 2018-01-19 MED ORDER — PREDNISONE 10 MG PO TABS: ORAL_TABLET | ORAL | 0 refills | Status: DC

## 2018-01-19 MED ORDER — DM-GUAIFENESIN ER 30-600 MG PO TB12: 1.0000 | ORAL_TABLET | Freq: Two times a day (BID) | ORAL | 0 refills | Status: AC

## 2018-01-19 MED ORDER — CIPROFLOXACIN HCL 500 MG PO TABS: 500.0000 mg | ORAL_TABLET | Freq: Two times a day (BID) | ORAL | 0 refills | Status: DC

## 2018-01-19 NOTE — Progress Notes (Signed)
Patient  And daughter-in-law at bedside.  Patient  And family verbalized discharge instructions.IV removed and patient is dressed awaiting transport via transport.

## 2018-01-19 NOTE — Progress Notes (Signed)
Report called [facility]  to and given to [place].

## 2018-01-19 NOTE — Progress Notes (Signed)
Patient will DC to: [facility] Anticipated DC date: 01/19/18 Family notified:Son and daughter in Systems developer by: Domenica Reamer   Per MD patient ready for DC to [facility]. RN, patient, patient's family, and facility notified of DC. Discharge Summary sent to facility. RN given number for report [phone] Room 210). DC packet on chart. Ambulance transport requested for patient.   CSW signing off.  Cedric Fishman, [place] Social Worker [phone]

## 2018-01-19 NOTE — Clinical Social Work Placement (Signed)
   CLINICAL SOCIAL WORK PLACEMENT  NOTE  Date:  01/19/2018  Patient Details  Name: Samantha Zamora MRN: [id number] Date of Birth: [birth year]  Clinical Social Work is seeking post-discharge placement for this patient at the [place] level of care (*CSW will initial, date and re-position this form in  chart as items are completed):  Yes   Patient/family provided with [place] Work Department's list of facilities offering this level of care within the geographic area requested by the patient (or if unable, by the patient's family).  Yes   Patient/family informed of their freedom to choose among providers that offer the needed level of care, that participate in Medicare, Medicaid or managed care program needed by the patient, have an available bed and are willing to accept the patient.  Yes   Patient/family informed of [facility]'s ownership interest in [facility] and [facility], as well as of the fact that they are under no obligation to receive care at these facilities.  PASRR submitted to EDS on       PASRR number received on       Existing PASRR number confirmed on 01/17/18     FL2 transmitted to all facilities in geographic area requested by pt/family on 01/17/18     FL2 transmitted to all facilities within larger geographic area on       Patient informed that his/her managed care company has contracts with or will negotiate with certain facilities, including the following:        Yes   Patient/family informed of bed offers received.  Patient chooses bed at [facility], [place]     Physician recommends and patient chooses bed at      Patient to be transferred to [facility] on 01/19/18.  Patient to be transferred to facility by [facility]     Patient family notified on 01/19/18 of transfer.  Name of family member notified:  Son and daughter in law     PHYSICIAN       Additional Comment:     _______________________________________________ Benard Halsted, [place] 01/19/2018, 1:41 PM

## 2018-01-19 NOTE — Discharge Summary (Addendum)
Discharge Summary  [facility] HDQ:[id number] DOB: 1924-11-24  PCP: Flossie Buffy, NP  Admit date: 01/14/2018 Discharge date: 01/19/2018  Time spent: 25 minutes   Recommendations for Outpatient Follow-up:  1. Follow-up with PCP in one month 2. New medication: Prednisone taper 3. New medication: Augmentin 875 by mouth twice a day 2 days  4. New medication: Cipro 500 mg by mouth twice a day 5 days  Discharge Diagnoses:  Active Hospital Problems   Diagnosis Date Noted  . Acute on chronic respiratory failure with hypoxia ([place]) 01/19/2018  . Acute metabolic encephalopathy 11/94/1740  . Influenza A 01/14/2018  . Mild dementia 03/31/2017  . COPD (chronic obstructive pulmonary disease) ([place]) 03/20/2017    Resolved Hospital Problems  No resolved problems to display.    Discharge Condition: Improved, being discharged to skilled nursing   Diet recommendation: Low-sodium diet   Vitals:   01/19/18 0557 01/19/18 0820  BP: (!) 155/61   Pulse: 62   Resp: 17   Temp: 98.2 F (36.8 C)   SpO2: 100% 98%    History of present illness:  84 year old female with past medical history of COPD, chronic respiratory failure with oxygen dependence, dementia and anxiety who presented to the emergency department with altered mental status, fever and increase in urinary frequency.  Upon ED evaluation she was found to be febrile, required BiPAP due to hypoxia and was found to be influenza A+.  Chest x-ray shows hyperinflation with right base patchy ill-defined infiltrate and patient was admitted with working diagnosis of acute on chronic respiratory failure due to COPD exacerbation due to influenza A with superimposed pneumonia of the right lower lobe.  Patient was started on Tamiflu and IV antibiotics.    Hospital Course:  Principal Problem:   Acute on chronic respiratory failure with hypoxia (HCC) secondary to COPD exacerbation, right lower lobe pneumonia and influenza A: Patient treated  with IV antibiotics, Tamiflu, steroids, nebulizers and supplemental oxygen. By 2/22, patient had completed Tamiflu. IV steroids were tapered down and changed over to by mouth steroids, she'll be discharged on by mouth prednisone. Oxygen wean down her baseline, 3 L nasal cannula. Antibiotics changed over to by mouth patient will be on antibiotics for 2 more days for seven-day course. Active Problems:   Mild dementia: Stable, continue remeron    Acute metabolic encephalopathy: Initially secondary to hypoxia, resolved with improvement in infection and oxygen supplementation  Deconditioning: Secondary to dementia and acute illness. Seen and evaluated by physical therapy who recommended short-term skilled nursing.  UTI: It was noted that prior to admission, patient had been to the emergency room at [facility] long the day before. A urinalysis was done which grew out 100,000 colonies of staph warnerii.  Staff has been resistant to penicillin, but quite sensitive to Cipro. We'll go ahead and treat with 5 days of Cipro.  Procedures:  None   Consultations:  None   Discharge Exam: BP (!) 155/61 (BP Location: Left Arm)   Pulse 62   Temp 98.2 F (36.8 C) (Oral)   Resp 17   Ht 5\' 3"  (1.6 m)   Wt 48.3 kg (106 lb 7.7 oz)   SpO2 98%   BMI 18.86 kg/m   General: Alert and oriented 2, no acute distress  Cardiovascular: Regular rate and rhythm, S1-S2  Respiratory: Decreased breath sounds throughout, mild expiratory wheeze   Discharge Instructions You were cared for by a hospitalist during your hospital stay. If you have any questions about your discharge medications or  the care you received while you were in the hospital after you are discharged, you can call the unit and asked to speak with the hospitalist on call if the hospitalist that took care of you is not available. Once you are discharged, your primary care physician will handle any further medical issues. Please note that NO REFILLS for any  discharge medications will be authorized once you are discharged, as it is imperative that you return to your primary care physician (or establish a relationship with a primary care physician if you do not have one) for your aftercare needs so that they can reassess your need for medications and monitor your lab values.  Discharge Instructions    Diet - low sodium heart healthy   Complete by:  As directed    Increase activity slowly   Complete by:  As directed      Allergies as of 01/19/2018      Reactions   Ibuprofen    (motrin)  Other reaction(s): Bleeding      Medication List    STOP taking these medications   cephALEXin 500 MG capsule Commonly known as:  KEFLEX   Fluticasone-Umeclidin-Vilant 100-62.5-25 MCG/INH Aepb Commonly known as:  TRELEGY ELLIPTA   oseltamivir 75 MG capsule Commonly known as:  TAMIFLU   traZODone 50 MG tablet Commonly known as:  DESYREL     TAKE these medications   amoxicillin-clavulanate 875-125 MG tablet Commonly known as:  AUGMENTIN Take 1 tablet by mouth every 12 (twelve) hours.   aspirin EC 81 MG tablet Take 81 mg by mouth daily.   ciprofloxacin 500 MG tablet Commonly known as:  CIPRO Take 1 tablet (500 mg total) by mouth 2 (two) times daily.   dextromethorphan-guaiFENesin 30-600 MG 12hr tablet Commonly known as:  MUCINEX DM Take 1 tablet by mouth 2 (two) times daily for 3 days.   ipratropium-albuterol 0.5-2.5 (3) MG/3ML Soln Commonly known as:  DUONEB Take 3 mLs by nebulization every 6 (six) hours as needed.   mirtazapine 15 MG tablet Commonly known as:  REMERON Take 15 mg by mouth at bedtime.   ondansetron 4 MG disintegrating tablet Commonly known as:  ZOFRAN ODT Take 1 tablet (4 mg total) by mouth every 8 (eight) hours as needed for nausea or vomiting.   OXYGEN Inhale 3 L into the lungs.   predniSONE 10 MG tablet Commonly known as:  DELTASONE 60 mg po daily for 2 days, then decrease by 10mg  every 2 days until finished         Allergies  Allergen Reactions  . Ibuprofen     (motrin)  Other reaction(s): Bleeding   Follow-up Information    Nche, [name], NP Follow up in 1 month(s).   Specialty:  Internal Medicine Contact information: [address] [place] [address] [phone]            The results of significant diagnostics from this hospitalization (including imaging, microbiology, ancillary and laboratory) are listed below for reference.    Significant Diagnostic Studies: Dg Chest 2 View  Result Date: 01/13/2018 CLINICAL DATA:  Shortness of breath, wheezing and congestion. EXAM: CHEST  2 VIEW COMPARISON:  09/13/2017 FINDINGS: The heart size and mediastinal contours are within normal limits. Aortic atherosclerosis. Both lungs are clear. Healing right posterior rib deformities are identified. IMPRESSION: 1. No acute cardiopulmonary abnormalities. 2.  Aortic Atherosclerosis (ICD10-I70.0). 3. Healing right posterior rib fractures. Electronically Signed   By: Kerby Moors M.D.   On: 01/13/2018 14:34  Dg Chest Portable 1 View  Result Date: 01/14/2018 CLINICAL DATA:  Shortness of breath this morning. EXAM: PORTABLE CHEST 1 VIEW COMPARISON:  01/13/2018, 09/13/2017 and 03/20/2017 FINDINGS: Lungs are hyperexpanded with subtle prominence of the bilateral infrahilar bronchovascular markings. No focal lobar consolidation or effusion. Mild increased lucency of the lungs. Cardiomediastinal silhouette and remainder of the exam is unchanged. Old posterior right rib fractures. IMPRESSION: Subtle prominence of the bilateral infrahilar bronchovascular markings which may be due to a degree of vascular congestion or viral bronchopneumonia. Emphysematous disease. Electronically Signed   By: Marin Olp M.D.   On: 01/14/2018 09:21    Microbiology: Recent Results (from the past 240 hour(s))  Urine culture     Status: Abnormal   Collection Time: 01/13/18  5:48 PM  Result Value Ref Range Status    Specimen Description   Final    URINE, RANDOM Performed at [facility] [address], [place], [place] [address]    Special Requests   Final    NONE Performed at [facility], [place] [address], [place], [place] [address]    Culture >=100,000 COLONIES/mL STAPHYLOCOCCUS WARNERI (A)  Final   Report Status 01/16/2018 FINAL  Final   Organism ID, Bacteria STAPHYLOCOCCUS WARNERI (A)  Final      Susceptibility   Staphylococcus warneri - MIC*    CIPROFLOXACIN <=0.5 SENSITIVE Sensitive     GENTAMICIN <=0.5 SENSITIVE Sensitive     NITROFURANTOIN <=16 SENSITIVE Sensitive     OXACILLIN 1 RESISTANT Resistant     TETRACYCLINE 2 SENSITIVE Sensitive     VANCOMYCIN 1 SENSITIVE Sensitive     TRIMETH/SULFA <=10 SENSITIVE Sensitive     CLINDAMYCIN <=0.25 SENSITIVE Sensitive     RIFAMPIN <=0.5 SENSITIVE Sensitive     Inducible Clindamycin NEGATIVE Sensitive     * >=100,000 COLONIES/mL STAPHYLOCOCCUS WARNERI  Blood Culture (routine x 2)     Status: None (Preliminary result)   Collection Time: 01/14/18  8:20 AM  Result Value Ref Range Status   Specimen Description BLOOD RIGHT FOREARM  Final   Special Requests   Final    BOTTLES DRAWN AEROBIC AND ANAEROBIC Blood Culture adequate volume   Culture   Final    NO GROWTH 4 DAYS Performed at [facility], [address] [place], [place] [address]    Report Status PENDING  Incomplete  Blood Culture (routine x 2)     Status: Abnormal   Collection Time: 01/14/18  8:32 AM  Result Value Ref Range Status   Specimen Description BLOOD RIGHT WRIST  Final   Special Requests IN PEDIATRIC BOTTLE Blood Culture adequate volume  Final   Culture  Setup Time   Final    GRAM POSITIVE COCCI IN PEDIATRIC BOTTLE CRITICAL RESULT CALLED TO, READ BACK BY AND VERIFIED WITH: J LEDFORD PHARMD 01/15/18 2352 JDW    Culture (A)  Final    MICROCOCCUS SPECIES Standardized susceptibility testing for this organism is not  available. Performed at [facility], [place] [address] [place], [place] [address]    Report Status 01/18/2018 FINAL  Final  Blood Culture ID Panel (Reflexed)     Status: None   Collection Time: 01/14/18  8:32 AM  Result Value Ref Range Status   Enterococcus species NOT DETECTED NOT DETECTED Final    Comment: CRITICAL RESULT CALLED TO, READ BACK BY AND VERIFIED WITH: J LEDFORD PHARMD 01/15/18 2352 JDW    Listeria monocytogenes NOT DETECTED NOT DETECTED Final  Staphylococcus species NOT DETECTED NOT DETECTED Final   Staphylococcus aureus NOT DETECTED NOT DETECTED Final   Streptococcus species NOT DETECTED NOT DETECTED Final   Streptococcus agalactiae NOT DETECTED NOT DETECTED Final   Streptococcus pneumoniae NOT DETECTED NOT DETECTED Final   Streptococcus pyogenes NOT DETECTED NOT DETECTED Final   Acinetobacter baumannii NOT DETECTED NOT DETECTED Final   Enterobacteriaceae species NOT DETECTED NOT DETECTED Final   Enterobacter cloacae complex NOT DETECTED NOT DETECTED Final   Escherichia coli NOT DETECTED NOT DETECTED Final   Klebsiella oxytoca NOT DETECTED NOT DETECTED Final   Klebsiella pneumoniae NOT DETECTED NOT DETECTED Final   Proteus species NOT DETECTED NOT DETECTED Final   Serratia marcescens NOT DETECTED NOT DETECTED Final   Haemophilus influenzae NOT DETECTED NOT DETECTED Final   Neisseria meningitidis NOT DETECTED NOT DETECTED Final   Pseudomonas aeruginosa NOT DETECTED NOT DETECTED Final   Candida albicans NOT DETECTED NOT DETECTED Final   Candida glabrata NOT DETECTED NOT DETECTED Final   Candida krusei NOT DETECTED NOT DETECTED Final   Candida parapsilosis NOT DETECTED NOT DETECTED Final   Candida tropicalis NOT DETECTED NOT DETECTED Final  MRSA PCR Screening     Status: None   Collection Time: 01/14/18  6:36 PM  Result Value Ref Range Status   MRSA by PCR NEGATIVE NEGATIVE Final    Comment:        The GeneXpert MRSA Assay (FDA approved for NASAL  specimens only), is one component of a comprehensive MRSA colonization surveillance program. It is not intended to diagnose MRSA infection nor to guide or monitor treatment for MRSA infections. Performed at [facility], [place] [address], [place], [place] [address]   Culture, blood (routine x 2)     Status: None (Preliminary result)   Collection Time: 01/16/18  8:30 AM  Result Value Ref Range Status   Specimen Description BLOOD RIGHT ANTECUBITAL  Final   Special Requests   Final    BOTTLES DRAWN AEROBIC AND ANAEROBIC Blood Culture adequate volume   Culture   Final    NO GROWTH 2 DAYS Performed at [facility], [place] [address], [place], [place] [address]    Report Status PENDING  Incomplete  Culture, blood (routine x 2)     Status: None (Preliminary result)   Collection Time: 01/16/18  8:45 AM  Result Value Ref Range Status   Specimen Description BLOOD RIGHT HAND  Final   Special Requests   Final    BOTTLES DRAWN AEROBIC AND ANAEROBIC Blood Culture adequate volume   Culture   Final    NO GROWTH 2 DAYS Performed at [facility], [place] [address], [place],  [address]    Report Status PENDING  Incomplete     Labs: Basic Metabolic Panel: Recent Labs  Lab 01/13/18 1605 01/14/18 0756 01/15/18 0135 01/16/18 0213  NA 136 139 142 142  K 4.2 4.1 4.4 4.4  CL 92* 94* 103 102  CO2 38* 34* 31 31  GLUCOSE 119* 158* 105* 98  BUN 11 10 13 16   CREATININE 0.64 0.70 0.63 0.63  CALCIUM 9.2 9.4 8.5* 9.0   Liver Function Tests: Recent Labs  Lab 01/14/18 0756  AST 41  ALT 20  ALKPHOS 68  BILITOT 0.8  PROT 6.4*  ALBUMIN 3.4*   No results for input(s): LIPASE, AMYLASE in the last 168 hours. No results for input(s): AMMONIA in the last 168 hours. CBC: Recent Labs  Lab 01/13/18 1605 01/14/18  6384 01/15/18 0135 01/16/18 0213 01/17/18 0702  WBC 6.6 8.5 8.2 5.2 4.6  NEUTROABS 5.1 7.6  --  4.4 3.1  HGB 10.7* 11.1* 9.5* 8.9* 9.4*  HCT 35.5* 38.2 32.5*  30.6* 31.9*  MCV 94.4 96.2 94.8 93.6 93.0  PLT 151 128* 124* 124* 143*   Cardiac Enzymes: No results for input(s): CKTOTAL, CKMB, CKMBINDEX, TROPONINI in the last 168 hours. BNP: BNP (last 3 results) No results for input(s): BNP in the last 8760 hours.  ProBNP (last 3 results) No results for input(s): PROBNP in the last 8760 hours.  CBG: Recent Labs  Lab 01/17/18 0804  GLUCAP 73       Signed:  Annita Brod, MD [facility] 01/19/2018, 12:33 PM

## 2018-01-20 DIAGNOSIS — G9340 Encephalopathy, unspecified: Secondary | ICD-10-CM | POA: Diagnosis not present

## 2018-01-20 DIAGNOSIS — J449 Chronic obstructive pulmonary disease, unspecified: Secondary | ICD-10-CM | POA: Diagnosis not present

## 2018-01-20 DIAGNOSIS — J9610 Chronic respiratory failure, unspecified whether with hypoxia or hypercapnia: Secondary | ICD-10-CM | POA: Diagnosis not present

## 2018-01-20 DIAGNOSIS — F0390 Unspecified dementia without behavioral disturbance: Secondary | ICD-10-CM | POA: Diagnosis not present

## 2018-01-20 DIAGNOSIS — N390 Urinary tract infection, site not specified: Secondary | ICD-10-CM | POA: Diagnosis not present

## 2018-01-20 DIAGNOSIS — J101 Influenza due to other identified influenza virus with other respiratory manifestations: Secondary | ICD-10-CM | POA: Diagnosis not present

## 2018-01-21 LAB — CULTURE, BLOOD (ROUTINE X 2)
CULTURE: NO GROWTH
CULTURE: NO GROWTH
SPECIAL REQUESTS: ADEQUATE
Special Requests: ADEQUATE

## 2018-01-27 ENCOUNTER — Inpatient Hospital Stay (HOSPITAL_COMMUNITY)
Admission: EM | Admit: 2018-01-27 | Discharge: 2018-02-02 | DRG: 190 | Disposition: A | Discharge status: Skilled Nursing Facility | Payer: Medicare Other | Attending: Internal Medicine | Admitting: Internal Medicine

## 2018-01-27 ENCOUNTER — Emergency Department (HOSPITAL_COMMUNITY): Payer: Medicare Other

## 2018-01-27 ENCOUNTER — Encounter (HOSPITAL_COMMUNITY): Payer: Self-pay

## 2018-01-27 ENCOUNTER — Other Ambulatory Visit: Payer: Self-pay

## 2018-01-27 DIAGNOSIS — E872 Acidosis: Secondary | ICD-10-CM | POA: Diagnosis present

## 2018-01-27 DIAGNOSIS — R319 Hematuria, unspecified: Secondary | ICD-10-CM | POA: Diagnosis present

## 2018-01-27 DIAGNOSIS — M810 Age-related osteoporosis without current pathological fracture: Secondary | ICD-10-CM | POA: Diagnosis present

## 2018-01-27 DIAGNOSIS — I10 Essential (primary) hypertension: Secondary | ICD-10-CM | POA: Diagnosis not present

## 2018-01-27 DIAGNOSIS — Z7982 Long term (current) use of aspirin: Secondary | ICD-10-CM | POA: Diagnosis not present

## 2018-01-27 DIAGNOSIS — Z79899 Other long term (current) drug therapy: Secondary | ICD-10-CM | POA: Diagnosis not present

## 2018-01-27 DIAGNOSIS — J449 Chronic obstructive pulmonary disease, unspecified: Secondary | ICD-10-CM | POA: Diagnosis present

## 2018-01-27 DIAGNOSIS — E119 Type 2 diabetes mellitus without complications: Secondary | ICD-10-CM | POA: Diagnosis present

## 2018-01-27 DIAGNOSIS — J9612 Chronic respiratory failure with hypercapnia: Secondary | ICD-10-CM

## 2018-01-27 DIAGNOSIS — J9622 Acute and chronic respiratory failure with hypercapnia: Secondary | ICD-10-CM | POA: Diagnosis present

## 2018-01-27 DIAGNOSIS — Z9981 Dependence on supplemental oxygen: Secondary | ICD-10-CM

## 2018-01-27 DIAGNOSIS — R488 Other symbolic dysfunctions: Secondary | ICD-10-CM | POA: Diagnosis not present

## 2018-01-27 DIAGNOSIS — N179 Acute kidney failure, unspecified: Secondary | ICD-10-CM | POA: Diagnosis not present

## 2018-01-27 DIAGNOSIS — R402441 Other coma, without documented Glasgow coma scale score, or with partial score reported, in the field [EMT or ambulance]: Secondary | ICD-10-CM | POA: Diagnosis not present

## 2018-01-27 DIAGNOSIS — M25511 Pain in right shoulder: Secondary | ICD-10-CM | POA: Diagnosis not present

## 2018-01-27 DIAGNOSIS — D696 Thrombocytopenia, unspecified: Secondary | ICD-10-CM | POA: Diagnosis not present

## 2018-01-27 DIAGNOSIS — R2689 Other abnormalities of gait and mobility: Secondary | ICD-10-CM | POA: Diagnosis not present

## 2018-01-27 DIAGNOSIS — Z66 Do not resuscitate: Secondary | ICD-10-CM | POA: Diagnosis present

## 2018-01-27 DIAGNOSIS — J9600 Acute respiratory failure, unspecified whether with hypoxia or hypercapnia: Secondary | ICD-10-CM | POA: Diagnosis not present

## 2018-01-27 DIAGNOSIS — S4991XA Unspecified injury of right shoulder and upper arm, initial encounter: Secondary | ICD-10-CM | POA: Diagnosis not present

## 2018-01-27 DIAGNOSIS — J9621 Acute and chronic respiratory failure with hypoxia: Secondary | ICD-10-CM | POA: Diagnosis present

## 2018-01-27 DIAGNOSIS — K219 Gastro-esophageal reflux disease without esophagitis: Secondary | ICD-10-CM | POA: Diagnosis present

## 2018-01-27 DIAGNOSIS — F0390 Unspecified dementia without behavioral disturbance: Secondary | ICD-10-CM | POA: Diagnosis not present

## 2018-01-27 DIAGNOSIS — J441 Chronic obstructive pulmonary disease with (acute) exacerbation: Principal | ICD-10-CM | POA: Diagnosis present

## 2018-01-27 DIAGNOSIS — Z85038 Personal history of other malignant neoplasm of large intestine: Secondary | ICD-10-CM

## 2018-01-27 DIAGNOSIS — J9611 Chronic respiratory failure with hypoxia: Secondary | ICD-10-CM | POA: Diagnosis present

## 2018-01-27 DIAGNOSIS — E875 Hyperkalemia: Secondary | ICD-10-CM | POA: Diagnosis present

## 2018-01-27 DIAGNOSIS — R410 Disorientation, unspecified: Secondary | ICD-10-CM

## 2018-01-27 DIAGNOSIS — R0602 Shortness of breath: Secondary | ICD-10-CM | POA: Diagnosis not present

## 2018-01-27 DIAGNOSIS — D649 Anemia, unspecified: Secondary | ICD-10-CM | POA: Diagnosis present

## 2018-01-27 DIAGNOSIS — R0600 Dyspnea, unspecified: Secondary | ICD-10-CM

## 2018-01-27 DIAGNOSIS — R4182 Altered mental status, unspecified: Secondary | ICD-10-CM | POA: Diagnosis not present

## 2018-01-27 DIAGNOSIS — Z87891 Personal history of nicotine dependence: Secondary | ICD-10-CM

## 2018-01-27 DIAGNOSIS — R05 Cough: Secondary | ICD-10-CM | POA: Diagnosis not present

## 2018-01-27 DIAGNOSIS — J9601 Acute respiratory failure with hypoxia: Secondary | ICD-10-CM | POA: Diagnosis not present

## 2018-01-27 DIAGNOSIS — R1311 Dysphagia, oral phase: Secondary | ICD-10-CM | POA: Diagnosis not present

## 2018-01-27 DIAGNOSIS — F0391 Unspecified dementia with behavioral disturbance: Secondary | ICD-10-CM | POA: Diagnosis present

## 2018-01-27 DIAGNOSIS — M6281 Muscle weakness (generalized): Secondary | ICD-10-CM | POA: Diagnosis not present

## 2018-01-27 LAB — BASIC METABOLIC PANEL
ANION GAP: 10 (ref 5–15)
ANION GAP: 12 (ref 5–15)
BUN: 43 mg/dL — ABNORMAL HIGH (ref 6–20)
BUN: 42 mg/dL — ABNORMAL HIGH (ref 6–20)
CALCIUM: 9.1 mg/dL (ref 8.9–10.3)
CALCIUM: 8.6 mg/dL — AB (ref 8.9–10.3)
CO2: 32 mmol/L (ref 22–32)
CO2: 26 mmol/L (ref 22–32)
CREATININE: 2.11 mg/dL — AB (ref 0.44–1.00)
Chloride: 98 mmol/L — ABNORMAL LOW (ref 101–111)
Chloride: 100 mmol/L — ABNORMAL LOW (ref 101–111)
Creatinine, Ser: 2.26 mg/dL — ABNORMAL HIGH (ref 0.44–1.00)
GFR calc Af Amer: 21 mL/min — ABNORMAL LOW (ref >60)
GFR, EST AFRICAN AMERICAN: 22 mL/min — AB (ref >60)
GFR, EST NON AFRICAN AMERICAN: 18 mL/min — AB (ref >60)
GFR, EST NON AFRICAN AMERICAN: 19 mL/min — AB (ref >60)
GLUCOSE: 101 mg/dL — AB (ref 65–99)
Glucose, Bld: 162 mg/dL — ABNORMAL HIGH (ref 65–99)
Potassium: 5 mmol/L (ref 3.5–5.1)
Potassium: 5.5 mmol/L — ABNORMAL HIGH (ref 3.5–5.1)
Sodium: 140 mmol/L (ref 135–145)
Sodium: 138 mmol/L (ref 135–145)

## 2018-01-27 LAB — BLOOD GAS, ARTERIAL
ACID-BASE EXCESS: 10.9 mmol/L — AB (ref 0.0–2.0)
Bicarbonate: 36.5 mmol/L — ABNORMAL HIGH (ref 20.0–28.0)
DRAWN BY: 505221
O2 CONTENT: 2 L/min
O2 SAT: 97.2 %
PATIENT TEMPERATURE: 98.6
PCO2 ART: 65.2 mmHg — AB (ref 32.0–48.0)
pH, Arterial: 7.367 (ref 7.350–7.450)
pO2, Arterial: 93.8 mmHg (ref 83.0–108.0)

## 2018-01-27 LAB — CBC WITH DIFFERENTIAL/PLATELET
Basophils Absolute: 0 10*3/uL (ref 0.0–0.1)
Basophils Relative: 0 %
EOS ABS: 0.2 10*3/uL (ref 0.0–0.7)
EOS PCT: 2 %
HCT: 37.1 % (ref 36.0–46.0)
Hemoglobin: 11.1 g/dL — ABNORMAL LOW (ref 12.0–15.0)
LYMPHS ABS: 1.7 10*3/uL (ref 0.7–4.0)
LYMPHS PCT: 15 %
MCH: 28 pg (ref 26.0–34.0)
MCHC: 29.9 g/dL — ABNORMAL LOW (ref 30.0–36.0)
MCV: 93.7 fL (ref 78.0–100.0)
MONO ABS: 0.9 10*3/uL (ref 0.1–1.0)
Monocytes Relative: 8 %
Neutro Abs: 8.5 10*3/uL — ABNORMAL HIGH (ref 1.7–7.7)
Neutrophils Relative %: 75 %
PLATELETS: 163 10*3/uL (ref 150–400)
RBC: 3.96 MIL/uL (ref 3.87–5.11)
RDW: 14.5 % (ref 11.5–15.5)
WBC: 11.2 10*3/uL — AB (ref 4.0–10.5)

## 2018-01-27 LAB — GLUCOSE, CAPILLARY: GLUCOSE-CAPILLARY: 172 mg/dL — AB (ref 65–99)

## 2018-01-27 LAB — I-STAT ARTERIAL BLOOD GAS, ED
ACID-BASE EXCESS: 11 mmol/L — AB (ref 0.0–2.0)
BICARBONATE: 40.6 mmol/L — AB (ref 20.0–28.0)
O2 SAT: 93 %
PH ART: 7.302 — AB (ref 7.350–7.450)
TCO2: 43 mmol/L — AB (ref 22–32)
pCO2 arterial: 81.7 mmHg (ref 32.0–48.0)
pO2, Arterial: 76 mmHg — ABNORMAL LOW (ref 83.0–108.0)

## 2018-01-27 LAB — I-STAT CG4 LACTIC ACID, ED: Lactic Acid, Venous: 2.01 mmol/L (ref 0.5–1.9)

## 2018-01-27 LAB — MRSA PCR SCREENING: MRSA by PCR: NEGATIVE

## 2018-01-27 LAB — MAGNESIUM: MAGNESIUM: 2.3 mg/dL (ref 1.7–2.4)

## 2018-01-27 LAB — PHOSPHORUS: PHOSPHORUS: 4.1 mg/dL (ref 2.5–4.6)

## 2018-01-27 MED ORDER — LEVOFLOXACIN IN D5W 500 MG/100ML IV SOLN
500.0000 mg | INTRAVENOUS | Status: DC
  Filled 2018-01-27: qty 100

## 2018-01-27 MED ORDER — LEVOFLOXACIN IN D5W 750 MG/150ML IV SOLN
750.0000 mg | Freq: Once | INTRAVENOUS | Status: AC
  Administered 2018-01-27: 750 mg via INTRAVENOUS
  Filled 2018-01-27: qty 150

## 2018-01-27 MED ORDER — ACETAMINOPHEN 325 MG PO TABS: 650.0000 mg | ORAL_TABLET | Freq: Four times a day (QID) | ORAL | Status: DC | PRN

## 2018-01-27 MED ORDER — INSULIN ASPART 100 UNIT/ML ~~LOC~~ SOLN
0.0000 [IU] | SUBCUTANEOUS | Status: DC
  Administered 2018-01-27: 2 [IU] via SUBCUTANEOUS
  Administered 2018-01-28: 1 [IU] via SUBCUTANEOUS
  Administered 2018-01-28: 2 [IU] via SUBCUTANEOUS
  Administered 2018-01-28: 1 [IU] via SUBCUTANEOUS
  Administered 2018-01-28: 2 [IU] via SUBCUTANEOUS
  Administered 2018-01-28: 1 [IU] via SUBCUTANEOUS
  Administered 2018-01-29: 2 [IU] via SUBCUTANEOUS
  Administered 2018-01-29: 1 [IU] via SUBCUTANEOUS
  Administered 2018-01-29: 2 [IU] via SUBCUTANEOUS
  Administered 2018-01-30 – 2018-01-31 (×3): 3 [IU] via SUBCUTANEOUS
  Administered 2018-01-31: 1 [IU] via SUBCUTANEOUS
  Administered 2018-01-31: 2 [IU] via SUBCUTANEOUS
  Administered 2018-02-01 (×2): 1 [IU] via SUBCUTANEOUS

## 2018-01-27 MED ORDER — ONDANSETRON HCL 4 MG/2ML IJ SOLN: 4.0000 mg | Freq: Four times a day (QID) | INTRAMUSCULAR | Status: DC | PRN

## 2018-01-27 MED ORDER — ALBUTEROL SULFATE (2.5 MG/3ML) 0.083% IN NEBU
2.5000 mg | INHALATION_SOLUTION | RESPIRATORY_TRACT | Status: DC | PRN
  Administered 2018-01-30: 2.5 mg via RESPIRATORY_TRACT
  Filled 2018-01-27: qty 3

## 2018-01-27 MED ORDER — ACETAMINOPHEN 650 MG RE SUPP: 650.0000 mg | Freq: Four times a day (QID) | RECTAL | Status: DC | PRN

## 2018-01-27 MED ORDER — SODIUM CHLORIDE 0.9 % IV SOLN
INTRAVENOUS | Status: DC
  Administered 2018-01-27: 1000 mL via INTRAVENOUS
  Administered 2018-01-28: 03:00:00 via INTRAVENOUS

## 2018-01-27 MED ORDER — METHYLPREDNISOLONE SODIUM SUCC 125 MG IJ SOLR
125.0000 mg | Freq: Two times a day (BID) | INTRAMUSCULAR | Status: DC
  Administered 2018-01-28 – 2018-01-29 (×3): 125 mg via INTRAVENOUS
  Filled 2018-01-27 (×3): qty 2

## 2018-01-27 MED ORDER — SODIUM CHLORIDE 0.9 % IV BOLUS (SEPSIS)
1000.0000 mL | Freq: Once | INTRAVENOUS | Status: AC
  Administered 2018-01-27: 1000 mL via INTRAVENOUS

## 2018-01-27 MED ORDER — HEPARIN SODIUM (PORCINE) 5000 UNIT/ML IJ SOLN
5000.0000 [IU] | Freq: Three times a day (TID) | INTRAMUSCULAR | Status: DC
  Administered 2018-01-27 – 2018-02-02 (×16): 5000 [IU] via SUBCUTANEOUS
  Filled 2018-01-27 (×17): qty 1

## 2018-01-27 MED ORDER — ALBUTEROL SULFATE (2.5 MG/3ML) 0.083% IN NEBU
5.0000 mg | INHALATION_SOLUTION | Freq: Once | RESPIRATORY_TRACT | Status: AC
  Administered 2018-01-27: 5 mg via RESPIRATORY_TRACT
  Filled 2018-01-27: qty 6

## 2018-01-27 MED ORDER — HYDRALAZINE HCL 20 MG/ML IJ SOLN: 10.0000 mg | Freq: Three times a day (TID) | INTRAMUSCULAR | Status: DC | PRN

## 2018-01-27 MED ORDER — INSULIN ASPART 100 UNIT/ML ~~LOC~~ SOLN: 0.0000 [IU] | Freq: Three times a day (TID) | SUBCUTANEOUS | Status: DC

## 2018-01-27 MED ORDER — IPRATROPIUM-ALBUTEROL 0.5-2.5 (3) MG/3ML IN SOLN
3.0000 mL | Freq: Four times a day (QID) | RESPIRATORY_TRACT | Status: DC
  Administered 2018-01-27 – 2018-01-28 (×3): 3 mL via RESPIRATORY_TRACT
  Filled 2018-01-27 (×3): qty 3

## 2018-01-27 MED ORDER — ONDANSETRON HCL 4 MG PO TABS: 4.0000 mg | ORAL_TABLET | Freq: Four times a day (QID) | ORAL | Status: DC | PRN

## 2018-01-27 MED ORDER — METHYLPREDNISOLONE SODIUM SUCC 125 MG IJ SOLR
125.0000 mg | Freq: Once | INTRAMUSCULAR | Status: AC
  Administered 2018-01-27: 125 mg via INTRAVENOUS
  Filled 2018-01-27: qty 2

## 2018-01-27 MED ORDER — MIRTAZAPINE 15 MG PO TABS
15.0000 mg | ORAL_TABLET | Freq: Every day | ORAL | Status: DC
  Administered 2018-01-27 – 2018-02-01 (×5): 15 mg via ORAL
  Filled 2018-01-27 (×7): qty 1

## 2018-01-27 MED ORDER — SODIUM CHLORIDE 0.9 % IV SOLN
2.0000 g | Freq: Once | INTRAVENOUS | Status: AC
  Administered 2018-01-27: 2 g via INTRAVENOUS
  Filled 2018-01-27: qty 20

## 2018-01-27 NOTE — ED Notes (Signed)
I Stat Lactic Acid results  Reported to Dr. Tamera Punt, repeated back.

## 2018-01-27 NOTE — Progress Notes (Signed)
Patient was placed on Bipap. Resting comfortably at this time.

## 2018-01-27 NOTE — ED Provider Notes (Signed)
[facility] EMERGENCY DEPARTMENT Provider Note   CSN: [id number] Arrival date & time: 01/27/18  1003     History   Chief Complaint Chief Complaint  Patient presents with  . Cough    HPI Samantha Zamora is a 84 y.o. female.  Patient is a 84 year old female with a history of dementia, diabetes, hypertension and COPD D requiring 3 L of home oxygen who presents with altered mental status.  Family states that she is less alert than she normally is.  She does reside in assisted living facility.  She was recently admitted for healthcare associated pneumonia as well as influenza.  She had respiratory failure at that time requiring BiPAP.  She was discharged on February 22.  The family states she had a persistent cough since that time.  No known fevers.  No vomiting.      Past Medical History:  Diagnosis Date  . Allergic rhinitis   . COPD (chronic obstructive pulmonary disease) ([place])    O2 dependent  . Dementia    early stage  . DM (diabetes mellitus) ([place])   . Eczema   . GERD (gastroesophageal reflux disease)   . HTN (hypertension), benign   . Hx of colon cancer, stage I   . Osteoporosis     Patient Active Problem List   Diagnosis Date Noted  . Acute respiratory failure ([place]) 01/27/2018  . Hypoxia   . Mild dementia 03/31/2017  . Anxiety 03/31/2017  . Depression 03/31/2017  . Chronic rhinitis 03/24/2017  . 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 COPD (chronic obstructive pulmonary disease) ([place]) 03/20/2017    Past Surgical History:  Procedure Laterality Date  . ABDOMINAL HYSTERECTOMY  1975  . COLON SURGERY  01/2010  . UMBILICAL HERNIA REPAIR  01/2010    OB History    No data available       Home Medications    Prior to Admission medications   Medication Sig Start Date End Date Taking? Authorizing Provider  amoxicillin-clavulanate (AUGMENTIN) 875-125 MG tablet Take 1 tablet by mouth every 12 (twelve) hours. 01/19/18   Annita Brod, MD  aspirin EC  81 MG tablet Take 81 mg by mouth daily.    [provider]  ciprofloxacin (CIPRO) 500 MG tablet Take 1 tablet (500 mg total) by mouth 2 (two) times daily. 01/19/18   Annita Brod, MD  ipratropium-albuterol (DUONEB) 0.5-2.5 (3) MG/3ML SOLN Take 3 mLs by nebulization every 6 (six) hours as needed. 01/19/18   Annita Brod, MD  mirtazapine (REMERON) 15 MG tablet Take 15 mg by mouth at bedtime. 05/03/17   [provider]  ondansetron (ZOFRAN ODT) 4 MG disintegrating tablet Take 1 tablet (4 mg total) by mouth every 8 (eight) hours as needed for nausea or vomiting. 01/13/18   Glyn Ade, PA-C  OXYGEN Inhale 3 L into the lungs.    [provider]  predniSONE (DELTASONE) 10 MG tablet 60 mg po daily for 2 days, then decrease by 10mg  every 2 days until finished 01/19/18   Annita Brod, MD    Family History Family History  Family history unknown: Yes    Social History Social History   Tobacco Use  . Smoking status: Former Smoker    Packs/day: 2.00    Years: 30.00    Pack years: 60.00  . Smokeless tobacco: Never Used  . Tobacco comment: quit smoking over 50 years ago  Substance Use Topics  . Alcohol use: No  . Drug use:  No     Allergies   Ibuprofen   Review of Systems Review of Systems  Unable to perform ROS: Dementia     Physical Exam Updated Vital Signs BP (!) 147/70   Pulse 73   Temp 98.7 F (37.1 C) (Rectal)   Resp 15   Ht 5\' 3"  (1.6 m)   Wt 48.1 kg (106 lb)   SpO2 94%   BMI 18.78 kg/m   Physical Exam  Constitutional: She appears well-developed and well-nourished.  HENT:  Head: Normocephalic and atraumatic.  Eyes: Pupils are equal, round, and reactive to light.  Neck: Normal range of motion. Neck supple.  Cardiovascular: Normal rate, regular rhythm and normal heart sounds.  Pulmonary/Chest: Effort normal. No respiratory distress. She has no wheezes. She has rales. She exhibits no tenderness.  Abdominal: Soft. Bowel  sounds are normal. There is no tenderness. There is no rebound and no guarding.  Musculoskeletal: Normal range of motion. She exhibits no edema.  Lymphadenopathy:    She has no cervical adenopathy.  Neurological:  Patient is drowsy but will wake up, open her eyes and answer questions.  She is oriented to person only.  She moves all extremities symmetrically without focal deficits.  Skin: Skin is warm and dry. No rash noted.  Psychiatric: She has a normal mood and affect.     ED Treatments / Results  Labs (all labs ordered are listed, but only abnormal results are displayed) Labs Reviewed  CBC WITH DIFFERENTIAL/PLATELET - Abnormal; Notable for the following components:      Result Value   WBC 11.2 (*)    Hemoglobin 11.1 (*)    MCHC 29.9 (*)    Neutro Abs 8.5 (*)    All other components within normal limits  BASIC METABOLIC PANEL - Abnormal; Notable for the following components:   Chloride 98 (*)    Glucose, Bld 101 (*)    BUN 43 (*)    Creatinine, Ser 2.26 (*)    GFR calc non Af Amer 18 (*)    GFR calc Af Amer 21 (*)    All other components within normal limits  I-STAT ARTERIAL BLOOD GAS, ED - Abnormal; Notable for the following components:   pH, Arterial 7.302 (*)    pCO2 arterial 81.7 (*)    pO2, Arterial 76.0 (*)    Bicarbonate 40.6 (*)    TCO2 43 (*)    Acid-Base Excess 11.0 (*)    All other components within normal limits  BLOOD GAS, ARTERIAL  URINALYSIS, ROUTINE W REFLEX MICROSCOPIC  I-STAT CG4 LACTIC ACID, ED    EKG  EKG Interpretation  Date/Time:  Saturday January 27 2018 10:15:02 EST Ventricular Rate:  71 PR Interval:    QRS Duration: 87 QT Interval:  375 QTC Calculation: 408 R Axis:   81 Text Interpretation:  Sinus rhythm Anteroseptal infarct, age indeterminate since last tracing no significant change Confirmed by Malvin Johns [phone]) on 01/27/2018 3:27:35 PM       Radiology Dg Chest 2 View  Result Date: 01/27/2018 CLINICAL DATA:  Cough x1 week EXAM:  CHEST  2 VIEW COMPARISON:  01/14/2018 FINDINGS: Prior right lower lobe opacity has improved. Mild left basilar opacity, likely scarring/atelectasis. Chronic right midlung/perihilar scarring. No pleural effusion or pneumothorax. The heart is normal in size. Mild degenerative changes of the visualized thoracolumbar spine. IMPRESSION: No evidence of acute cardiopulmonary disease. Electronically Signed   By: Julian Hy M.D.   On: 01/27/2018 11:32   Dg Shoulder Right  Result Date: 01/27/2018 CLINICAL DATA:  Status post fall, shoulder pain EXAM: RIGHT SHOULDER - 2+ VIEW COMPARISON:  None. FINDINGS: Generalized osteopenia. No acute fracture or dislocation. Mild osteoarthritis of the glenohumeral joint. Mild arthropathy of the acromioclavicular joint. IMPRESSION: No acute osseous injury of the right shoulder. Electronically Signed   By: Kathreen Devoid   On: 01/27/2018 15:15   Ct Head Wo Contrast  Result Date: 01/27/2018 CLINICAL DATA:  84 year old female with lethargy. Dementia. Cough for 1 week. EXAM: CT HEAD WITHOUT CONTRAST TECHNIQUE: Contiguous axial images were obtained from the base of the skull through the vertex without intravenous contrast. COMPARISON:  Head CT 09/06/2017. FINDINGS: Brain: Mild cerebral atrophy. Patchy and confluent areas of decreased attenuation are noted throughout the deep and periventricular white matter of the cerebral hemispheres bilaterally, compatible with chronic microvascular ischemic disease. No evidence of acute infarction, hemorrhage, hydrocephalus, extra-axial collection or mass lesion/mass effect. Vascular: No hyperdense vessel or unexpected calcification. Skull: Normal. Negative for fracture or focal lesion. Sinuses/Orbits: No acute finding. Other: None. IMPRESSION: 1. No acute intracranial abnormalities. 2. Mild cerebral atrophy with extensive chronic microvascular ischemic changes in the cerebral white matter, as above, similar to the prior examination. Electronically  Signed   By: Vinnie Langton M.D.   On: 01/27/2018 14:56    Procedures Procedures (including critical care time)  Medications Ordered in ED Medications  levofloxacin (LEVAQUIN) IVPB 750 mg (750 mg Intravenous New Bag/Given 01/27/18 1456)  albuterol (PROVENTIL) (2.5 MG/3ML) 0.083% nebulizer solution 5 mg (5 mg Nebulization Given 01/27/18 1208)  methylPREDNISolone sodium succinate (SOLU-MEDROL) 125 mg/2 mL injection 125 mg (125 mg Intravenous Given 01/27/18 1300)  sodium chloride 0.9 % bolus 1,000 mL (0 mLs Intravenous Stopped 01/27/18 1500)     Initial Impression / Assessment and Plan / ED Course  I have reviewed the triage vital signs and the nursing notes.  Pertinent labs & imaging results that were available during my care of the patient were reviewed by me and considered in my medical decision making (see chart for details).     Patient is a 84 year old female who presents with altered mental status.  She has no fever.  No hypotension or other signs of sepsis.  Chest x-ray does not show any evidence of pneumonia.  Her ABG does show an elevated PCO2 and she was started on BiPAP given this.  Her mental status did not improve significantly after the BiPAP although she will wake up and answer questions pretty easily.  She does have some confusion which is baseline for her.  I did get a report from the family that she had a recent fall and has some bruising to her right shoulder.  For this reason I did go back and do a CT of her head which did not show any acute abnormalities.  X-rays of her right shoulder do not show any evidence of fracture.  Her labs show an elevated creatinine as compared to her baseline values.  She was given some IV fluids.  This may be prerenal as she had a normal creatinine about 2 weeks ago.  I spoke with Dr. Aggie Moats with the hospitalist service to admit the patient for further treatment.  CRITICAL CARE Performed by: Malvin Johns Total critical care time: 60  minutes Critical care time was exclusive of separately billable procedures and treating other patients. Critical care was necessary to treat or prevent imminent or life-threatening deterioration. Critical care was time spent personally by me on the following activities: development of  treatment plan with patient and/or surrogate as well as nursing, discussions with consultants, evaluation of patient's response to treatment, examination of patient, obtaining history from patient or surrogate, ordering and performing treatments and interventions, ordering and review of laboratory studies, ordering and review of radiographic studies, pulse oximetry and re-evaluation of patient's condition.   Final Clinical Impressions(s) / ED Diagnoses   Final diagnoses:  Disorientation  COPD exacerbation ([place])  Acute on chronic respiratory failure with hypercapnia [facility])    ED Discharge Orders    None       Malvin Johns, MD 01/27/18 1528

## 2018-01-27 NOTE — Progress Notes (Signed)
CRITICAL VALUE ALERT  Critical Value:  CO2 65.2 previous CO2 81.7  Date & Time Notied:  01/27/18  1825  Provider Notified: Arlyn Leak  Orders Received/Actions taken: Pending

## 2018-01-27 NOTE — ED Notes (Signed)
Patient transported to CT 

## 2018-01-27 NOTE — Progress Notes (Signed)
Patient taken off Bipap for transport to CT. Will place back on when she returns.

## 2018-01-27 NOTE — ED Notes (Signed)
Patient is resting with family at bedside  

## 2018-01-27 NOTE — Progress Notes (Signed)
RT attempted to place patient on Bipap. Patient out of room at this time for x-ray. Bipap set up in room, RT will check back for patient.

## 2018-01-27 NOTE — H&P (Signed)
[facility] History and Physical  Samantha Zamora VZC:[id number] DOB: 1924-06-17 DOA: 01/27/2018  Referring physician:  PCP: Flossie Buffy, NP   Chief Complaint: "I'm tired and short of breath."  HPI: Samantha Zamora is a 84 y.o. female with past medical history significant for allergic rhinitis, COPD, dementia, diabetes, reflux, hypertension and chronic respiratory failure on home oxygen.  Dense with a chief complaint of shortness of breath.  Patient's symptoms started acutely today.  Patient sent sore yesterday and states patient was her normal state of health.  This a.m. patient had a shortness of breath with no accompanying cough or fever.  No known sick contacts.  Patient is in rehab currently no known viruses spreading through the facility.  Family asked that EMS be called due to patient's dyspnea.  EMS at the scene transported patient to the emergency room for further evaluation.  ED course: In the emergency room patient was found to be hypoxic and was immediate placed on BiPAP.  Patient responded well.  Started on Levaquin.  1 L bolus of fluid given.  Chest x-ray did not show any pneumonia or anything else acute.  Blood gas showed a respiratory acidosis with acidemia.  Additionally one nebulization of albuterol IV methylprednisone was given treat for presumed acute COPD exacerbation.  Hospitalist consulted for admission.   Review of Systems:  As per HPI otherwise 10 point review of systems negative.    Past Medical History:  Diagnosis Date  . Allergic rhinitis   . COPD (chronic obstructive pulmonary disease) ([place])    O2 dependent  . Dementia    early stage  . DM (diabetes mellitus) ([place])   . Eczema   . GERD (gastroesophageal reflux disease)   . HTN (hypertension), benign   . Hx of colon cancer, stage I   . Osteoporosis    Past Surgical History:  Procedure Laterality Date  . ABDOMINAL HYSTERECTOMY  1975  . COLON SURGERY  01/2010  . UMBILICAL HERNIA REPAIR   01/2010   Social History:  reports that she has quit smoking. She has a 60.00 pack-year smoking history. she has never used smokeless tobacco. She reports that she does not drink alcohol or use drugs.  Allergies  Allergen Reactions  . Ibuprofen     (motrin)  Other reaction(s): Bleeding    Family History  Family history unknown: Yes     Prior to Admission medications   Medication Sig Start Date End Date Taking? Authorizing Provider  amoxicillin-clavulanate (AUGMENTIN) 875-125 MG tablet Take 1 tablet by mouth every 12 (twelve) hours. 01/19/18   Annita Brod, MD  aspirin EC 81 MG tablet Take 81 mg by mouth daily.    [provider]  ciprofloxacin (CIPRO) 500 MG tablet Take 1 tablet (500 mg total) by mouth 2 (two) times daily. 01/19/18   Annita Brod, MD  ipratropium-albuterol (DUONEB) 0.5-2.5 (3) MG/3ML SOLN Take 3 mLs by nebulization every 6 (six) hours as needed. 01/19/18   Annita Brod, MD  mirtazapine (REMERON) 15 MG tablet Take 15 mg by mouth at bedtime. 05/03/17   [provider]  ondansetron (ZOFRAN ODT) 4 MG disintegrating tablet Take 1 tablet (4 mg total) by mouth every 8 (eight) hours as needed for nausea or vomiting. 01/13/18   Glyn Ade, PA-C  OXYGEN Inhale 3 L into the lungs.    [provider]  predniSONE (DELTASONE) 10 MG tablet 60 mg po daily for 2 days, then decrease by 10mg  every 2 days until  finished 01/19/18   Annita Brod, MD   Physical Exam: Vitals:   01/27/18 1230 01/27/18 1245 01/27/18 1400 01/27/18 1458  BP: (!) 126/57 (!) 142/59  (!) 147/70  Pulse: 66 69 65 73  Resp:   19 15  Temp:      TempSrc:      SpO2: 100% 100% 100% 94%  Weight:      Height:        Wt Readings from Last 3 Encounters:  01/27/18 48.1 kg (106 lb)  01/19/18 48.3 kg (106 lb 7.7 oz)  10/02/17 49.9 kg (110 lb)    General:  Appears calm and comfortable, GCS 14, A&Ox3 Eyes:  PERRL, EOMI, normal lids, iris ENT:  grossly normal  hearing, lips & tongue Neck:  no LAD, masses or thyromegaly Cardiovascular:  RRR, no m/r/g. No LE edema.  Respiratory:  Diffuse wheeze, on bipap Abdomen:  soft, ntnd Skin:  no rash or induration seen on limited exam Musculoskeletal:  grossly normal tone BUE/BLE Psychiatric:  grossly normal mood and affect, speech fluent and appropriate Neurologic:  CN 2-12 grossly intact, moves all extremities in coordinated fashion.          Labs on Admission:  Basic Metabolic Panel: Recent Labs  Lab 01/27/18 1302  NA 140  K 5.0  CL 98*  CO2 32  GLUCOSE 101*  BUN 43*  CREATININE 2.26*  CALCIUM 9.1   Liver Function Tests: No results for input(s): AST, ALT, ALKPHOS, BILITOT, PROT, ALBUMIN in the last 168 hours. No results for input(s): LIPASE, AMYLASE in the last 168 hours. No results for input(s): AMMONIA in the last 168 hours. CBC: Recent Labs  Lab 01/27/18 1152  WBC 11.2*  NEUTROABS 8.5*  HGB 11.1*  HCT 37.1  MCV 93.7  PLT 163   Cardiac Enzymes: No results for input(s): CKTOTAL, CKMB, CKMBINDEX, TROPONINI in the last 168 hours.  BNP (last 3 results) No results for input(s): BNP in the last 8760 hours.  ProBNP (last 3 results) No results for input(s): PROBNP in the last 8760 hours.   Serum creatinine: 2.26 mg/dL (H) 01/27/18 1302 Estimated creatinine clearance: 12.3 mL/min (A)  CBG: No results for input(s): GLUCAP in the last 168 hours.  Radiological Exams on Admission: Dg Chest 2 View  Result Date: 01/27/2018 CLINICAL DATA:  Cough x1 week EXAM: CHEST  2 VIEW COMPARISON:  01/14/2018 FINDINGS: Prior right lower lobe opacity has improved. Mild left basilar opacity, likely scarring/atelectasis. Chronic right midlung/perihilar scarring. No pleural effusion or pneumothorax. The heart is normal in size. Mild degenerative changes of the visualized thoracolumbar spine. IMPRESSION: No evidence of acute cardiopulmonary disease. Electronically Signed   By: Julian Hy M.D.   On:  01/27/2018 11:32   Dg Shoulder Right  Result Date: 01/27/2018 CLINICAL DATA:  Status post fall, shoulder pain EXAM: RIGHT SHOULDER - 2+ VIEW COMPARISON:  None. FINDINGS: Generalized osteopenia. No acute fracture or dislocation. Mild osteoarthritis of the glenohumeral joint. Mild arthropathy of the acromioclavicular joint. IMPRESSION: No acute osseous injury of the right shoulder. Electronically Signed   By: Kathreen Devoid   On: 01/27/2018 15:15   Ct Head Wo Contrast  Result Date: 01/27/2018 CLINICAL DATA:  84 year old female with lethargy. Dementia. Cough for 1 week. EXAM: CT HEAD WITHOUT CONTRAST TECHNIQUE: Contiguous axial images were obtained from the base of the skull through the vertex without intravenous contrast. COMPARISON:  Head CT 09/06/2017. FINDINGS: Brain: Mild cerebral atrophy. Patchy and confluent areas of decreased attenuation are noted  throughout the deep and periventricular white matter of the cerebral hemispheres bilaterally, compatible with chronic microvascular ischemic disease. No evidence of acute infarction, hemorrhage, hydrocephalus, extra-axial collection or mass lesion/mass effect. Vascular: No hyperdense vessel or unexpected calcification. Skull: Normal. Negative for fracture or focal lesion. Sinuses/Orbits: No acute finding. Other: None. IMPRESSION: 1. No acute intracranial abnormalities. 2. Mild cerebral atrophy with extensive chronic microvascular ischemic changes in the cerebral white matter, as above, similar to the prior examination. Electronically Signed   By: Vinnie Langton M.D.   On: 01/27/2018 14:56    EKG: Independently reviewed. NSR. No stemi.  Assessment/Plan Active Problems:   Acute respiratory failure (HCC)  Acute respiratory failure secondary to COPD exacerbation Scheduled DuoNeb's When necessary albuterol Antibiotic- levaquin Oxygen therapy Continuous pulse oximetry Resp Viral Pnl - pending Recheck ABG Sch IV steroids bipap prn Rt consult CXR in  AM  Hypertension When necessary hydralazine 10 mg IV as needed for severe blood pressure  DM II Q4 SSI  K 5.0 No EKG changes Will trend Likely due to acidemia Will give IVF and bipap to address underlying issue 2g Ca IV   AKI Baseline Cr <1.0, Cr on admit 2.26 1000 of normal saline given in the emergency room, IVF ordered Gentle hydration overnight Checking magnesium and phosphorus Urine labs ordered to calculate fractional excretion of Na I&O  Code Status: DNR  DVT Prophylaxis: heparin Family Communication: son Disposition Plan: Pending Improvement  Status: inpt sdu  Elwin Mocha, MD Family Medicine [facility] [email or web address] Password [email or web address]

## 2018-01-27 NOTE — ED Triage Notes (Signed)
Pt brought in by EMS from [facility] due to having a cough x1 week. Pt woke up today and more lethargic than usual. Pt has dementia, but is alert and follow commands. Pt wear O2 at 3L.

## 2018-01-27 NOTE — ED Notes (Signed)
Got patient undress on the monitor patient is resting with family at bedside and call bell in reach 

## 2018-01-28 ENCOUNTER — Inpatient Hospital Stay (HOSPITAL_COMMUNITY): Payer: Medicare Other

## 2018-01-28 DIAGNOSIS — J9621 Acute and chronic respiratory failure with hypoxia: Secondary | ICD-10-CM

## 2018-01-28 LAB — BASIC METABOLIC PANEL
Anion gap: 9 (ref 5–15)
BUN: 45 mg/dL — AB (ref 6–20)
CALCIUM: 8.8 mg/dL — AB (ref 8.9–10.3)
CO2: 29 mmol/L (ref 22–32)
CREATININE: 2.22 mg/dL — AB (ref 0.44–1.00)
Chloride: 99 mmol/L — ABNORMAL LOW (ref 101–111)
GFR calc non Af Amer: 18 mL/min — ABNORMAL LOW (ref >60)
GFR, EST AFRICAN AMERICAN: 21 mL/min — AB (ref >60)
Glucose, Bld: 164 mg/dL — ABNORMAL HIGH (ref 65–99)
Potassium: 5.1 mmol/L (ref 3.5–5.1)
SODIUM: 137 mmol/L (ref 135–145)

## 2018-01-28 LAB — GLUCOSE, CAPILLARY
GLUCOSE-CAPILLARY: 128 mg/dL — AB (ref 65–99)
Glucose-Capillary: 198 mg/dL — ABNORMAL HIGH (ref 65–99)
Glucose-Capillary: 146 mg/dL — ABNORMAL HIGH (ref 65–99)

## 2018-01-28 LAB — URINALYSIS, ROUTINE W REFLEX MICROSCOPIC
Bilirubin Urine: NEGATIVE
GLUCOSE, UA: NEGATIVE mg/dL
KETONES UR: NEGATIVE mg/dL
Leukocytes, UA: NEGATIVE
Nitrite: NEGATIVE
PH: 6 (ref 5.0–8.0)
Protein, ur: NEGATIVE mg/dL
SPECIFIC GRAVITY, URINE: 1.009 (ref 1.005–1.030)

## 2018-01-28 LAB — CREATININE, URINE, RANDOM: Creatinine, Urine: 40.93 mg/dL

## 2018-01-28 LAB — RESPIRATORY PANEL BY PCR
Adenovirus: NOT DETECTED
BORDETELLA PERTUSSIS-RVPCR: NOT DETECTED
CHLAMYDOPHILA PNEUMONIAE-RVPPCR: NOT DETECTED
Coronavirus 229E: NOT DETECTED
Coronavirus HKU1: NOT DETECTED
Coronavirus NL63: NOT DETECTED
Coronavirus OC43: NOT DETECTED
INFLUENZA A-RVPPCR: NOT DETECTED
INFLUENZA B-RVPPCR: NOT DETECTED
METAPNEUMOVIRUS-RVPPCR: NOT DETECTED
Mycoplasma pneumoniae: NOT DETECTED
PARAINFLUENZA VIRUS 2-RVPPCR: NOT DETECTED
PARAINFLUENZA VIRUS 3-RVPPCR: NOT DETECTED
PARAINFLUENZA VIRUS 4-RVPPCR: NOT DETECTED
Parainfluenza Virus 1: NOT DETECTED
RESPIRATORY SYNCYTIAL VIRUS-RVPPCR: NOT DETECTED
RHINOVIRUS / ENTEROVIRUS - RVPPCR: NOT DETECTED

## 2018-01-28 LAB — BLOOD GAS, ARTERIAL
Acid-Base Excess: 6.7 mmol/L — ABNORMAL HIGH (ref 0.0–2.0)
Bicarbonate: 31.8 mmol/L — ABNORMAL HIGH (ref 20.0–28.0)
DRAWN BY: 52076
Delivery systems: POSITIVE
EXPIRATORY PAP: 5
FIO2: 30
INSPIRATORY PAP: 10
O2 SAT: 95 %
PCO2 ART: 53.6 mmHg — AB (ref 32.0–48.0)
PO2 ART: 74 mmHg — AB (ref 83.0–108.0)
Patient temperature: 97.2
pH, Arterial: 7.387 (ref 7.350–7.450)

## 2018-01-28 LAB — CBC
HCT: 32.8 % — ABNORMAL LOW (ref 36.0–46.0)
Hemoglobin: 9.8 g/dL — ABNORMAL LOW (ref 12.0–15.0)
MCH: 27.5 pg (ref 26.0–34.0)
MCHC: 29.9 g/dL — ABNORMAL LOW (ref 30.0–36.0)
MCV: 91.9 fL (ref 78.0–100.0)
PLATELETS: 214 10*3/uL (ref 150–400)
RBC: 3.57 MIL/uL — AB (ref 3.87–5.11)
RDW: 14.3 % (ref 11.5–15.5)
WBC: 8.3 10*3/uL (ref 4.0–10.5)

## 2018-01-28 LAB — SODIUM, URINE, RANDOM: Sodium, Ur: 83 mmol/L

## 2018-01-28 MED ORDER — TRAZODONE HCL 50 MG PO TABS
50.0000 mg | ORAL_TABLET | Freq: Every evening | ORAL | Status: DC | PRN
  Administered 2018-01-30: 50 mg via ORAL
  Filled 2018-01-28: qty 1

## 2018-01-28 MED ORDER — ASPIRIN EC 81 MG PO TBEC
81.0000 mg | DELAYED_RELEASE_TABLET | Freq: Every day | ORAL | Status: DC
  Administered 2018-01-28 – 2018-02-02 (×6): 81 mg via ORAL
  Filled 2018-01-28 (×6): qty 1

## 2018-01-28 MED ORDER — SODIUM CHLORIDE 0.9 % IV SOLN
500.0000 mg | Freq: Every day | INTRAVENOUS | Status: DC
  Administered 2018-01-28 – 2018-01-29 (×2): 500 mg via INTRAVENOUS
  Filled 2018-01-28 (×2): qty 500

## 2018-01-28 MED ORDER — HALOPERIDOL LACTATE 2 MG/ML PO CONC
1.0000 mg | Freq: Four times a day (QID) | ORAL | Status: DC | PRN
  Administered 2018-01-30: 1 mg via ORAL
  Filled 2018-01-28 (×2): qty 0.5

## 2018-01-28 MED ORDER — TRAMADOL HCL 50 MG PO TABS: 50.0000 mg | ORAL_TABLET | Freq: Four times a day (QID) | ORAL | Status: DC | PRN

## 2018-01-28 MED ORDER — LORAZEPAM 1 MG PO TABS
1.0000 mg | ORAL_TABLET | Freq: Four times a day (QID) | ORAL | Status: DC | PRN
  Administered 2018-01-28 – 2018-01-31 (×2): 1 mg via ORAL
  Filled 2018-01-28 (×2): qty 1

## 2018-01-28 MED ORDER — SODIUM CHLORIDE 0.9 % IV SOLN
INTRAVENOUS | Status: DC
  Administered 2018-01-28 – 2018-01-31 (×6): via INTRAVENOUS

## 2018-01-28 MED ORDER — IPRATROPIUM-ALBUTEROL 0.5-2.5 (3) MG/3ML IN SOLN
3.0000 mL | Freq: Two times a day (BID) | RESPIRATORY_TRACT | Status: DC
  Administered 2018-01-28 – 2018-02-01 (×8): 3 mL via RESPIRATORY_TRACT
  Filled 2018-01-28 (×8): qty 3

## 2018-01-28 MED ORDER — MORPHINE SULFATE (PF) 2 MG/ML IV SOLN: 0.5000 mg | INTRAVENOUS | Status: DC | PRN

## 2018-01-28 NOTE — Progress Notes (Signed)
PROGRESS NOTE    Samantha Zamora  DUK:[id number] DOB: 1924-09-08 DOA: 01/27/2018 PCP: Flossie Buffy, NP     Brief Narrative:  Samantha Zamora is a 84 y.o. female with past medical history significant for allergic rhinitis, COPD, dementia, diabetes, reflux, hypertension and chronic respiratory failure on home oxygen.  She present from nursing home with a chief complaint of shortness of breath.  Patient's symptoms started acutely today. Per granddaughter at bedside, she was less responsive at nursing facility. In the ED, patient was found to be hypoxic and was placed on BiPAP. CXR was unremarkable for consolidation. ABG revealed respiratory acidosis. She was admitted for acute hypoxemic respiratory failure with acute COPD exacerbation.   Assessment & Plan:   Active Problems:   Acute respiratory failure (HCC)  Acute hypoxemic and hypercapnic respiratory failure secondary to COPD exacerbation, with chronic hypoxemic respiratory failure  -BiPAP as needed, wean to [place] O2 as able, baseline 3L [place] O2  -CXR reviewed personally, no acute pulmonary process  -Influenza positive 2 weeks ago and was started on tamiflu  -Azithromycin  -Respiratory viral panel pending -Solumedrol, nebs   HTN -BP elevated this morning. Not on antihypertensive at home. Hydralazine IV prn for SBP >180   DM -SSI   AKI  -Baseline Cr 0.6-0.7 -IVF   Dementia with behavioral disturbance -Per granddaughter, at baseline, she is conversational, able to feed herself. She is quite forgetful and forgets place and time. She does sometimes get agitated and delirious.  -Continue home remeron, haldol, ativan, trazodone   Hyperkalemia -Resolved    DVT prophylaxis: Heparin subq  Code Status: DNR Family Communication: Granddaughter at bedside Disposition Plan: Pending improvement, back to nursing facility    Consultants:   None  Procedures:   None   Antimicrobials:  Anti-infectives (From admission, onward)    Start     Dose/Rate Route Frequency Ordered Stop   01/28/18 1500  levofloxacin (LEVAQUIN) IVPB 500 mg  Status:  Discontinued     500 mg 100 mL/hr over 60 Minutes Intravenous Every 48 hours 01/27/18 1605 01/28/18 0812   01/28/18 0900  azithromycin (ZITHROMAX) 500 mg in sodium chloride 0.9 % 250 mL IVPB     500 mg 250 mL/hr over 60 Minutes Intravenous Daily 01/28/18 0812     01/27/18 1415  levofloxacin (LEVAQUIN) IVPB 750 mg     750 mg 100 mL/hr over 90 Minutes Intravenous  Once 01/27/18 1410 01/27/18 1626       Subjective: Patient on BiPAP and without complaints, ROS limited by BiPAP and dementia   Objective: Vitals:   01/28/18 0500 01/28/18 0600 01/28/18 0722 01/28/18 0900  BP:   (!) 182/86   Pulse:   67   Resp:   18   Temp:   98.2 F (36.8 C)   TempSrc:   Axillary   SpO2: 100% 100%  100%  Weight:      Height:        Intake/Output Summary (Last 24 hours) at 01/28/2018 1035 Last data filed at 01/28/2018 0600 Gross per 24 hour  Intake 2629.58 ml  Output 1017 ml  Net 1612.58 ml   Filed Weights   01/27/18 1014 01/28/18 0445  Weight: 48.1 kg (106 lb) 48.1 kg (106 lb 0.7 oz)    Examination:  General exam: Appears calm and comfortable  Respiratory system: Clear to auscultation. Respiratory effort normal on BiPAP  Cardiovascular system: S1 & S2 heard, RRR. No JVD, murmurs, rubs, gallops or clicks. No pedal edema. Gastrointestinal system: Abdomen  is nondistended, soft and nontender. No organomegaly or masses felt. Normal bowel sounds heard. Central nervous system: Alert and arousable, able to answer simple questions appropriately  Extremities: Symmetric 5 x 5 power. Skin: No rashes, lesions or ulcers Psychiatry: +Dementia   Data Reviewed: I have personally reviewed following labs and imaging studies  CBC: Recent Labs  Lab 01/27/18 1152 01/28/18 0233  WBC 11.2* 8.3  NEUTROABS 8.5*  --   HGB 11.1* 9.8*  HCT 37.1 32.8*  MCV 93.7 91.9  PLT 163 097   Basic  Metabolic Panel: Recent Labs  Lab 01/27/18 1302 01/27/18 1556 01/27/18 2050 01/28/18 0233  NA 140  --  138 137  K 5.0  --  5.5* 5.1  CL 98*  --  100* 99*  CO2 32  --  26 29  GLUCOSE 101*  --  162* 164*  BUN 43*  --  42* 45*  CREATININE 2.26*  --  2.11* 2.22*  CALCIUM 9.1  --  8.6* 8.8*  MG  --  2.3  --   --   PHOS  --  4.1  --   --    GFR: Estimated Creatinine Clearance: 12.5 mL/min (A) (by C-G formula based on SCr of 2.22 mg/dL (H)). Liver Function Tests: No results for input(s): AST, ALT, ALKPHOS, BILITOT, PROT, ALBUMIN in the last 168 hours. No results for input(s): LIPASE, AMYLASE in the last 168 hours. No results for input(s): AMMONIA in the last 168 hours. Coagulation Profile: No results for input(s): INR, PROTIME in the last 168 hours. Cardiac Enzymes: No results for input(s): CKTOTAL, CKMB, CKMBINDEX, TROPONINI in the last 168 hours. BNP (last 3 results) No results for input(s): PROBNP in the last 8760 hours. HbA1C: No results for input(s): HGBA1C in the last 72 hours. CBG: Recent Labs  Lab 01/27/18 2040 01/28/18 0720  GLUCAP 172* 128*   Lipid Profile: No results for input(s): CHOL, HDL, LDLCALC, TRIG, CHOLHDL, LDLDIRECT in the last 72 hours. Thyroid Function Tests: No results for input(s): TSH, T4TOTAL, FREET4, T3FREE, THYROIDAB in the last 72 hours. Anemia Panel: No results for input(s): VITAMINB12, FOLATE, FERRITIN, TIBC, IRON, RETICCTPCT in the last 72 hours. Sepsis Labs: Recent Labs  Lab 01/27/18 1609  LATICACIDVEN 2.01*    Recent Results (from the past 240 hour(s))  MRSA PCR Screening     Status: None   Collection Time: 01/27/18  5:44 PM  Result Value Ref Range Status   MRSA by PCR NEGATIVE NEGATIVE Final    Comment:        The GeneXpert MRSA Assay (FDA approved for NASAL specimens only), is one component of a comprehensive MRSA colonization surveillance program. It is not intended to diagnose MRSA infection nor to guide or monitor  treatment for MRSA infections. Performed at [facility], [address] [place], [place] [address]        Radiology Studies: Dg Chest 2 View  Result Date: 01/27/2018 CLINICAL DATA:  Cough x1 week EXAM: CHEST  2 VIEW COMPARISON:  01/14/2018 FINDINGS: Prior right lower lobe opacity has improved. Mild left basilar opacity, likely scarring/atelectasis. Chronic right midlung/perihilar scarring. No pleural effusion or pneumothorax. The heart is normal in size. Mild degenerative changes of the visualized thoracolumbar spine. IMPRESSION: No evidence of acute cardiopulmonary disease. Electronically Signed   By: Julian Hy M.D.   On: 01/27/2018 11:32   Dg Shoulder Right  Result Date: 01/27/2018 CLINICAL DATA:  Status post fall, shoulder pain EXAM: RIGHT SHOULDER - 2+ VIEW  COMPARISON:  None. FINDINGS: Generalized osteopenia. No acute fracture or dislocation. Mild osteoarthritis of the glenohumeral joint. Mild arthropathy of the acromioclavicular joint. IMPRESSION: No acute osseous injury of the right shoulder. Electronically Signed   By: Kathreen Devoid   On: 01/27/2018 15:15   Ct Head Wo Contrast  Result Date: 01/27/2018 CLINICAL DATA:  84 year old female with lethargy. Dementia. Cough for 1 week. EXAM: CT HEAD WITHOUT CONTRAST TECHNIQUE: Contiguous axial images were obtained from the base of the skull through the vertex without intravenous contrast. COMPARISON:  Head CT 09/06/2017. FINDINGS: Brain: Mild cerebral atrophy. Patchy and confluent areas of decreased attenuation are noted throughout the deep and periventricular white matter of the cerebral hemispheres bilaterally, compatible with chronic microvascular ischemic disease. No evidence of acute infarction, hemorrhage, hydrocephalus, extra-axial collection or mass lesion/mass effect. Vascular: No hyperdense vessel or unexpected calcification. Skull: Normal. Negative for fracture or focal lesion. Sinuses/Orbits: No acute finding. Other: None.  IMPRESSION: 1. No acute intracranial abnormalities. 2. Mild cerebral atrophy with extensive chronic microvascular ischemic changes in the cerebral white matter, as above, similar to the prior examination. Electronically Signed   By: Vinnie Langton M.D.   On: 01/27/2018 14:56   Dg Chest Port 1 View  Result Date: 01/28/2018 CLINICAL DATA:  Acute respiratory failure EXAM: PORTABLE CHEST 1 VIEW COMPARISON:  01/27/2018 FINDINGS: Lungs are clear.  No pleural effusion or pneumothorax. The heart is normal in size. IMPRESSION: No evidence of acute cardiopulmonary disease. Electronically Signed   By: Julian Hy M.D.   On: 01/28/2018 07:51      Scheduled Meds: . aspirin EC  81 mg Oral Daily  . heparin  5,000 Units Subcutaneous Q8H  . insulin aspart  0-9 Units Subcutaneous Q4H  . ipratropium-albuterol  3 mL Nebulization BID  . methylPREDNISolone (SOLU-MEDROL) injection  125 mg Intravenous Q12H  . mirtazapine  15 mg Oral QHS   Continuous Infusions: . sodium chloride    . azithromycin 500 mg (01/28/18 0838)     LOS: 1 day    Time spent: 40 minutes   Dessa Phi, DO [facility] [email or web address] Password [email or web address] 01/28/2018, 10:35 AM

## 2018-01-28 NOTE — Plan of Care (Signed)
Discussed plan of care for the evening, pain management and son about getting patient a meal when off the bipap with some teach back displayed.

## 2018-01-28 NOTE — Progress Notes (Signed)
Removed patient from bipap and administered duoneb treatment. Placed patient on 2lpm nasal cannula. Tolerating well, will continue to monitor.

## 2018-01-29 ENCOUNTER — Inpatient Hospital Stay (HOSPITAL_COMMUNITY): Payer: Medicare Other

## 2018-01-29 ENCOUNTER — Telehealth: Payer: Self-pay | Admitting: Pulmonary Disease

## 2018-01-29 LAB — CBC
HCT: 30.1 % — ABNORMAL LOW (ref 36.0–46.0)
Hemoglobin: 9.4 g/dL — ABNORMAL LOW (ref 12.0–15.0)
MCH: 28.1 pg (ref 26.0–34.0)
MCHC: 31.2 g/dL (ref 30.0–36.0)
MCV: 89.9 fL (ref 78.0–100.0)
Platelets: 198 10*3/uL (ref 150–400)
RBC: 3.35 MIL/uL — ABNORMAL LOW (ref 3.87–5.11)
RDW: 14.3 % (ref 11.5–15.5)
WBC: 15.4 10*3/uL — ABNORMAL HIGH (ref 4.0–10.5)

## 2018-01-29 LAB — BASIC METABOLIC PANEL
Anion gap: 7 (ref 5–15)
BUN: 44 mg/dL — ABNORMAL HIGH (ref 6–20)
CALCIUM: 8.8 mg/dL — AB (ref 8.9–10.3)
CO2: 30 mmol/L (ref 22–32)
CREATININE: 2.14 mg/dL — AB (ref 0.44–1.00)
Chloride: 103 mmol/L (ref 101–111)
GFR calc non Af Amer: 19 mL/min — ABNORMAL LOW (ref >60)
GFR, EST AFRICAN AMERICAN: 22 mL/min — AB (ref >60)
Glucose, Bld: 139 mg/dL — ABNORMAL HIGH (ref 65–99)
Potassium: 4.9 mmol/L (ref 3.5–5.1)
SODIUM: 140 mmol/L (ref 135–145)

## 2018-01-29 LAB — GLUCOSE, CAPILLARY
GLUCOSE-CAPILLARY: 122 mg/dL — AB (ref 65–99)
GLUCOSE-CAPILLARY: 152 mg/dL — AB (ref 65–99)
GLUCOSE-CAPILLARY: 161 mg/dL — AB (ref 65–99)
Glucose-Capillary: 119 mg/dL — ABNORMAL HIGH (ref 65–99)
Glucose-Capillary: 99 mg/dL (ref 65–99)

## 2018-01-29 MED ORDER — METHYLPREDNISOLONE SODIUM SUCC 125 MG IJ SOLR
80.0000 mg | Freq: Two times a day (BID) | INTRAMUSCULAR | Status: DC
  Administered 2018-01-29 – 2018-01-30 (×2): 80 mg via INTRAVENOUS
  Filled 2018-01-29 (×2): qty 2

## 2018-01-29 MED ORDER — AZITHROMYCIN 250 MG PO TABS
500.0000 mg | ORAL_TABLET | Freq: Every day | ORAL | Status: DC
  Administered 2018-01-30 – 2018-02-02 (×4): 500 mg via ORAL
  Filled 2018-01-29 (×4): qty 2

## 2018-01-29 MED ORDER — HYDRALAZINE HCL 20 MG/ML IJ SOLN
10.0000 mg | Freq: Three times a day (TID) | INTRAMUSCULAR | Status: DC | PRN
  Administered 2018-01-31 – 2018-02-02 (×3): 10 mg via INTRAVENOUS
  Filled 2018-01-29 (×3): qty 1

## 2018-01-29 NOTE — Progress Notes (Signed)
BIPAP on stand-by at bedside.Currently on  1.5L [place] HR 95, SPo2 97%. No distress noted.

## 2018-01-29 NOTE — Telephone Encounter (Signed)
Patient daughter in law and son is in the lobby - extremely concerned about the patient's need for sleep study - is consistently being re-admitted for the same issue -pr

## 2018-01-29 NOTE — NC FL2 (Addendum)
[place] MEDICAID FL2 LEVEL OF CARE SCREENING TOOL     IDENTIFICATION  Patient Name: Samantha Zamora Birthdate: [birth year] Sex: female Admission Date (Current Location): 01/27/2018  [facility] and [place] Number:  Herbalist and Address:  [facility], [place] [address] [place], [place] [address]      Provider Number: [id number]  Attending Physician Name and Address:  Dessa Phi, DO  Relative Name and Phone Number:  Jorene Guest, son, [phone]    Current Level of Care: Hospital Recommended Level of Care: [place] Prior Approval Number:    Date Approved/Denied:   PASRR Number: [id number]  Discharge Plan: SNF    Current Diagnoses: Patient Active Problem List   Diagnosis Date Noted  . Acute respiratory failure ([place]) 01/27/2018  . Hypoxia   . Mild dementia 03/31/2017  . Anxiety 03/31/2017  . Depression 03/31/2017  . Chronic rhinitis 03/24/2017  . 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 COPD (chronic obstructive pulmonary disease) ([place]) 03/20/2017    Orientation RESPIRATION BLADDER Height & Weight     Self  O2(3L [place]), new bipap at night Incontinent Weight: 149 lb 0.5 oz (67.6 kg) Height:  5\' 3"  (160 cm)  BEHAVIORAL SYMPTOMS/MOOD NEUROLOGICAL BOWEL NUTRITION STATUS      Incontinent Diet(cardia; carb modified)  AMBULATORY STATUS COMMUNICATION OF NEEDS Skin   Extensive Assist   Normal                       Personal Care Assistance Level of Assistance  Bathing, Dressing Bathing Assistance: Maximum assistance   Dressing Assistance: Maximum assistance     Functional Limitations Info             SPECIAL CARE FACTORS FREQUENCY  PT (By licensed PT), OT (By licensed OT)     PT Frequency: 5/wk OT Frequency: 5/wk            Contractures      Additional Factors Info  Code Status, Allergies Code Status Info: DNR Allergies Info: ibuprofen     Isolation Precautions Info: n/a     Current Medications  (01/29/2018):  This is the current hospital active medication list Current Facility-Administered Medications  Medication Dose Route Frequency Provider Last Rate Last Dose  . 0.9 %  sodium chloride infusion   Intravenous Continuous Dessa Phi, DO 100 mL/hr at 01/29/18 4270    . acetaminophen (TYLENOL) tablet 650 mg  650 mg Oral Q6H PRN Elwin Mocha, MD       Or  . acetaminophen (TYLENOL) suppository 650 mg  650 mg Rectal Q6H PRN Elwin Mocha, MD      . albuterol (PROVENTIL) (2.5 MG/3ML) 0.083% nebulizer solution 2.5 mg  2.5 mg Nebulization Q2H PRN Elwin Mocha, MD      . aspirin EC tablet 81 mg  81 mg Oral Daily Dessa Phi, DO   81 mg at 01/29/18 6237  . [START ON 01/30/2018] azithromycin (ZITHROMAX) tablet 500 mg  500 mg Oral Daily Dessa Phi, DO      . haloperidol (HALDOL) 2 MG/ML solution 1 mg  1 mg Oral Q6H PRN Dessa Phi, DO      . heparin injection 5,000 Units  5,000 Units Subcutaneous Q8H Elwin Mocha, MD   5,000 Units at 01/29/18 1300  . hydrALAZINE (APRESOLINE) injection 10 mg  10 mg Intravenous Q8H PRN Dessa Phi, DO      . insulin aspart (novoLOG) injection 0-9 Units  0-9 Units  Subcutaneous Q4H Elwin Mocha, MD   2 Units at 01/29/18 1156  . ipratropium-albuterol (DUONEB) 0.5-2.5 (3) MG/3ML nebulizer solution 3 mL  3 mL Nebulization BID Dessa Phi, DO   3 mL at 01/29/18 0857  . LORazepam (ATIVAN) tablet 1 mg  1 mg Oral Q6H PRN Dessa Phi, DO   1 mg at 01/28/18 1518  . methylPREDNISolone sodium succinate (SOLU-MEDROL) 125 mg/2 mL injection 80 mg  80 mg Intravenous Q12H Dessa Phi, DO   80 mg at 01/29/18 1207  . mirtazapine (REMERON) tablet 15 mg  15 mg Oral QHS Elwin Mocha, MD   15 mg at 01/28/18 2115  . morphine 2 MG/ML injection 0.5 mg  0.5 mg Intravenous Q4H PRN Dessa Phi, DO      . ondansetron [facility]) tablet 4 mg  4 mg Oral Q6H PRN Elwin Mocha, MD       Or  . ondansetron [facility]) injection 4 mg  4 mg Intravenous Q6H PRN  Elwin Mocha, MD      . traMADol [facility]) tablet 50 mg  50 mg Oral Q6H PRN Dessa Phi, DO      . traZODone (DESYREL) tablet 50 mg  50 mg Oral QHS PRN Dessa Phi, DO         Discharge Medications: Please see discharge summary for a list of discharge medications.  Relevant Imaging Results:  Relevant Lab Results:   Additional Information SSN: [id number]  Jorge Ny, LCSW

## 2018-01-29 NOTE — Progress Notes (Signed)
   Spoke with Dr. Vaughan Browner (Pulm) this afternoon. Discussed NIV/Trilogy. Will order CM consult and fill out necessary paperwork to get this arranged prior to discharge.   Dessa Phi, DO [facility] [email or web address] Password [email or web address] 01/29/2018, 5:59 PM

## 2018-01-29 NOTE — Telephone Encounter (Signed)
Spoke to pt's son [name] and Air traffic controller on file), expressing concerns about pt- states that pt has been hospitalized X3 in the past month and is currently admitted for weakness and high CO2 levels.  Pt has been placed on bipap machine X3 admissions to bring CO2 levels down.  Pt is currently scheduled for discharge on 3/6 to [facility].   Pt's son and dil request Dr. Matilde Bash recs on how to get pt on a bipap in outpatient setting as soon as possible- they express concerns about having pt sleep overnight in sleep lab for testing but were advised that for a bipap titration test that a sleep lab setting would be necessary.  Dr Vaughan Browner please advise.  Thanks.

## 2018-01-29 NOTE — Progress Notes (Signed)
PROGRESS NOTE    Samantha Zamora  ZYS:[id number] DOB: [birth year] DOA: 01/27/2018 PCP: Flossie Buffy, NP     Brief Narrative:  Samantha Zamora is a 84 y.o. female with past medical history significant for allergic rhinitis, COPD, dementia, diabetes, reflux, hypertension and chronic respiratory failure on home oxygen.  She present from nursing home with a chief complaint of shortness of breath.  Patient's symptoms started acutely today. Per granddaughter at bedside, she was less responsive at nursing facility. In the ED, patient was found to be hypoxic and was placed on BiPAP. CXR was unremarkable for consolidation. ABG revealed respiratory acidosis. She was admitted for acute hypoxemic respiratory failure with acute COPD exacerbation.   Assessment & Plan:   Active Problems:   Acute respiratory failure (HCC)  Acute hypoxemic and hypercapnic respiratory failure secondary to COPD exacerbation, with chronic hypoxemic respiratory failure  -Now off BiPAP. Continue [place] O2. Baseline 3L [place] O2  -CXR reviewed personally, no acute pulmonary process  -Influenza positive 2 weeks ago and was started on tamiflu. Respiratory viral panel negative.  -Azithromycin  -Solumedrol, nebs. Deescalate solumedrol dose   HTN -Hydralazine IV prn for SBP >180    DM -SSI   AKI  -Baseline Cr 0.6-0.7 -IVF  -Renal US ordered   Dementia with behavioral disturbance -Per granddaughter, at baseline, she is conversational, able to feed herself. She is quite forgetful and forgets place and time. She does sometimes get agitated and delirious.  -Continue home remeron, haldol, ativan, trazodone  -Stable   Hyperkalemia -Resolved    DVT prophylaxis: Heparin subq  Code Status: DNR Family Communication: Spoke with son and daughter in law over the phone today  Disposition Plan: Pending improvement, back to nursing facility    Consultants:   None  Procedures:   None   Antimicrobials:  Anti-infectives  (From admission, onward)   Start     Dose/Rate Route Frequency Ordered Stop   01/28/18 1500  levofloxacin (LEVAQUIN) IVPB 500 mg  Status:  Discontinued     500 mg 100 mL/hr over 60 Minutes Intravenous Every 48 hours 01/27/18 1605 01/28/18 0812   01/28/18 0900  azithromycin (ZITHROMAX) 500 mg in sodium chloride 0.9 % 250 mL IVPB     500 mg 250 mL/hr over 60 Minutes Intravenous Daily 01/28/18 0812     01/27/18 1415  levofloxacin (LEVAQUIN) IVPB 750 mg     750 mg 100 mL/hr over 90 Minutes Intravenous  Once 01/27/18 1410 01/27/18 1626       Subjective: ROS limited by dementia   Objective: Vitals:   01/29/18 0600 01/29/18 0734 01/29/18 0856 01/29/18 0857  BP:  (!) 163/68    Pulse:  84 82   Resp:  18 19   Temp:  (!) 97.3 F (36.3 C)    TempSrc:  Axillary    SpO2: 100%  100% 100%  Weight:      Height:        Intake/Output Summary (Last 24 hours) at 01/29/2018 1012 Last data filed at 01/29/2018 0840 Gross per 24 hour  Intake 3733.34 ml  Output 3100 ml  Net 633.34 ml   Filed Weights   01/27/18 1014 01/28/18 0445 01/29/18 0355  Weight: 48.1 kg (106 lb) 68 kg (149 lb 14.6 oz) 67.6 kg (149 lb 0.5 oz)    Examination: General exam: Appears calm and comfortable  Respiratory system: Clear to auscultation. Respiratory effort normal. Cardiovascular system: S1 & S2 heard, RRR. No JVD, murmurs, rubs, gallops or clicks. No pedal  edema. Gastrointestinal system: Abdomen is nondistended, soft and nontender. No organomegaly or masses felt. Normal bowel sounds heard. Central nervous system: Alert  Extremities: Symmetric 5 x 5 power. Skin: No rashes, lesions or ulcers Psychiatry: +Dementia   Data Reviewed: I have personally reviewed following labs and imaging studies  CBC: Recent Labs  Lab 01/27/18 1152 01/28/18 0233 01/29/18 0250  WBC 11.2* 8.3 15.4*  NEUTROABS 8.5*  --   --   HGB 11.1* 9.8* 9.4*  HCT 37.1 32.8* 30.1*  MCV 93.7 91.9 89.9  PLT 163 214 242   Basic Metabolic  Panel: Recent Labs  Lab 01/27/18 1302 01/27/18 1556 01/27/18 2050 01/28/18 0233 01/29/18 0250  NA 140  --  138 137 140  K 5.0  --  5.5* 5.1 4.9  CL 98*  --  100* 99* 103  CO2 32  --  26 29 30   GLUCOSE 101*  --  162* 164* 139*  BUN 43*  --  42* 45* 44*  CREATININE 2.26*  --  2.11* 2.22* 2.14*  CALCIUM 9.1  --  8.6* 8.8* 8.8*  MG  --  2.3  --   --   --   PHOS  --  4.1  --   --   --    GFR: Estimated Creatinine Clearance: 15.8 mL/min (A) (by C-G formula based on SCr of 2.14 mg/dL (H)). Liver Function Tests: No results for input(s): AST, ALT, ALKPHOS, BILITOT, PROT, ALBUMIN in the last 168 hours. No results for input(s): LIPASE, AMYLASE in the last 168 hours. No results for input(s): AMMONIA in the last 168 hours. Coagulation Profile: No results for input(s): INR, PROTIME in the last 168 hours. Cardiac Enzymes: No results for input(s): CKTOTAL, CKMB, CKMBINDEX, TROPONINI in the last 168 hours. BNP (last 3 results) No results for input(s): PROBNP in the last 8760 hours. HbA1C: No results for input(s): HGBA1C in the last 72 hours. CBG: Recent Labs  Lab 01/27/18 2040 01/28/18 0720 01/28/18 1141 01/28/18 1741 01/29/18 0732  GLUCAP 172* 128* 198* 146* 122*   Lipid Profile: No results for input(s): CHOL, HDL, LDLCALC, TRIG, CHOLHDL, LDLDIRECT in the last 72 hours. Thyroid Function Tests: No results for input(s): TSH, T4TOTAL, FREET4, T3FREE, THYROIDAB in the last 72 hours. Anemia Panel: No results for input(s): VITAMINB12, FOLATE, FERRITIN, TIBC, IRON, RETICCTPCT in the last 72 hours. Sepsis Labs: Recent Labs  Lab 01/27/18 1609  LATICACIDVEN 2.01*    Recent Results (from the past 240 hour(s))  MRSA PCR Screening     Status: None   Collection Time: 01/27/18  5:44 PM  Result Value Ref Range Status   MRSA by PCR NEGATIVE NEGATIVE Final    Comment:        The GeneXpert MRSA Assay (FDA approved for NASAL specimens only), is one component of a comprehensive MRSA  colonization surveillance program. It is not intended to diagnose MRSA infection nor to guide or monitor treatment for MRSA infections. Performed at [facility], [place] [address], [place], [place] [address]   Respiratory Panel by PCR     Status: None   Collection Time: 01/27/18 11:37 PM  Result Value Ref Range Status   Adenovirus NOT DETECTED NOT DETECTED Final   Coronavirus 229E NOT DETECTED NOT DETECTED Final   Coronavirus HKU1 NOT DETECTED NOT DETECTED Final   Coronavirus NL63 NOT DETECTED NOT DETECTED Final   Coronavirus OC43 NOT DETECTED NOT DETECTED Final   Metapneumovirus NOT DETECTED NOT DETECTED Final   Rhinovirus /  Enterovirus NOT DETECTED NOT DETECTED Final   Influenza A NOT DETECTED NOT DETECTED Final   Influenza B NOT DETECTED NOT DETECTED Final   Parainfluenza Virus 1 NOT DETECTED NOT DETECTED Final   Parainfluenza Virus 2 NOT DETECTED NOT DETECTED Final   Parainfluenza Virus 3 NOT DETECTED NOT DETECTED Final   Parainfluenza Virus 4 NOT DETECTED NOT DETECTED Final   Respiratory Syncytial Virus NOT DETECTED NOT DETECTED Final   Bordetella pertussis NOT DETECTED NOT DETECTED Final   Chlamydophila pneumoniae NOT DETECTED NOT DETECTED Final   Mycoplasma pneumoniae NOT DETECTED NOT DETECTED Final    Comment: Performed at [facility], [place] [address] [place],  [address]       Radiology Studies: Dg Chest 2 View  Result Date: 01/27/2018 CLINICAL DATA:  Cough x1 week EXAM: CHEST  2 VIEW COMPARISON:  01/14/2018 FINDINGS: Prior right lower lobe opacity has improved. Mild left basilar opacity, likely scarring/atelectasis. Chronic right midlung/perihilar scarring. No pleural effusion or pneumothorax. The heart is normal in size. Mild degenerative changes of the visualized thoracolumbar spine. IMPRESSION: No evidence of acute cardiopulmonary disease. Electronically Signed   By: Julian Hy M.D.   On: 01/27/2018 11:32   Dg Shoulder Right  Result Date:  01/27/2018 CLINICAL DATA:  Status post fall, shoulder pain EXAM: RIGHT SHOULDER - 2+ VIEW COMPARISON:  None. FINDINGS: Generalized osteopenia. No acute fracture or dislocation. Mild osteoarthritis of the glenohumeral joint. Mild arthropathy of the acromioclavicular joint. IMPRESSION: No acute osseous injury of the right shoulder. Electronically Signed   By: Kathreen Devoid   On: 01/27/2018 15:15   Ct Head Wo Contrast  Result Date: 01/27/2018 CLINICAL DATA:  84 year old female with lethargy. Dementia. Cough for 1 week. EXAM: CT HEAD WITHOUT CONTRAST TECHNIQUE: Contiguous axial images were obtained from the base of the skull through the vertex without intravenous contrast. COMPARISON:  Head CT 09/06/2017. FINDINGS: Brain: Mild cerebral atrophy. Patchy and confluent areas of decreased attenuation are noted throughout the deep and periventricular white matter of the cerebral hemispheres bilaterally, compatible with chronic microvascular ischemic disease. No evidence of acute infarction, hemorrhage, hydrocephalus, extra-axial collection or mass lesion/mass effect. Vascular: No hyperdense vessel or unexpected calcification. Skull: Normal. Negative for fracture or focal lesion. Sinuses/Orbits: No acute finding. Other: None. IMPRESSION: 1. No acute intracranial abnormalities. 2. Mild cerebral atrophy with extensive chronic microvascular ischemic changes in the cerebral white matter, as above, similar to the prior examination. Electronically Signed   By: Vinnie Langton M.D.   On: 01/27/2018 14:56   Dg Chest Port 1 View  Result Date: 01/28/2018 CLINICAL DATA:  Acute respiratory failure EXAM: PORTABLE CHEST 1 VIEW COMPARISON:  01/27/2018 FINDINGS: Lungs are clear.  No pleural effusion or pneumothorax. The heart is normal in size. IMPRESSION: No evidence of acute cardiopulmonary disease. Electronically Signed   By: Julian Hy M.D.   On: 01/28/2018 07:51      Scheduled Meds: . aspirin EC  81 mg Oral Daily  .  heparin  5,000 Units Subcutaneous Q8H  . insulin aspart  0-9 Units Subcutaneous Q4H  . ipratropium-albuterol  3 mL Nebulization BID  . methylPREDNISolone (SOLU-MEDROL) injection  80 mg Intravenous Q12H  . mirtazapine  15 mg Oral QHS   Continuous Infusions: . sodium chloride 100 mL/hr at 01/29/18 0622  . azithromycin 500 mg (01/29/18 0942)     LOS: 2 days    Time spent: 30 minutes   Dessa Phi, DO [facility] [email or web address] Password [email or web address] 01/29/2018, 10:12 AM

## 2018-01-29 NOTE — Clinical Social Work Note (Signed)
Clinical Social Work Assessment  Patient Details  Name: Samantha Zamora MRN: [id number] Date of Birth: [birth year]  Date of referral:  01/29/18               Reason for consult:  Facility Placement                Permission sought to share information with:  Facility Sport and exercise psychologist, Family Supports Permission granted to share information::  No(pt at procedure spoke with next of kin/decision maker)  Name::     Jerome/ Web designer::  [facility]  Relationship::  son/DIL  Contact Information:     Housing/Transportation Living arrangements for the past 2 months:  Single Family Home Source of Information:  Adult Children Patient Interpreter Needed:  None Criminal Activity/Legal Involvement Pertinent to Current Situation/Hospitalization:  No - Comment as needed Significant Relationships:  Adult Children Lives with:  Adult Children Do you feel safe going back to the place where you live?  Yes Need for family participation in patient care:  Yes (Comment)(decision making)  Care giving concerns:  Pt was at [facility] for short term rehab for 8 days prior to readmission.  Pt son happy with care at SNF at this time and no concerns with pt return.   Social Worker assessment / plan:  CSW met with pt son and dtr in law to discuss plan for time of DC.  They confirm pt was at [facility] prior to admission and that they felt as if this was a good setting.  Employment status:  Retired Forensic scientist:  Medicare PT Recommendations:  Not assessed at this time [place] / Referral to community resources:  [place]  Patient/Family's Response to care:  Son agreeable to pt return to [facility] at DC but hopeful that pt will recover enough soon to return home.  Patient/Family's Understanding of and Emotional Response to Diagnosis, Current Treatment, and Prognosis:  Pt son and dtr in law have good understanding of pt condition and are optimistic about her  recovery.  Emotional Assessment Appearance:  Appears stated age Attitude/Demeanor/Rapport:  Unable to Assess Affect (typically observed):  Unable to Assess Orientation:  Oriented to Self, Oriented to Situation, Oriented to Place Alcohol / Substance use:  Not Applicable Psych involvement (Current and /or in the community):  No (Comment)  Discharge Needs  Concerns to be addressed:  Care Coordination Readmission within the last 30 days:  Yes Current discharge risk:  Physical Impairment Barriers to Discharge:  Continued Medical Work up   Jorge Ny, LCSW 01/29/2018, 11:06 AM

## 2018-01-29 NOTE — Progress Notes (Signed)
PT Cancellation Note  Patient Details Name: Samantha Zamora MRN: [id number] DOB: [birth year]   Cancelled Treatment:    Reason Eval/Treat Not Completed: Fatigue/lethargy limiting ability to participate.  Pt was not able to have a conversation with PT and was very drowsy.  Not responding to verbal cues.  Will ck in the AM to see if she is more capable of performing a PT evaluation.   Ramond Dial 01/29/2018, 4:24 PM   Mee Hives, PT MS Acute Rehab Dept. Number: [place] and [place]

## 2018-01-29 NOTE — Plan of Care (Signed)
Discussed with patient's son plan of care for the evening and Bipap with some teach back displayed

## 2018-01-29 NOTE — Plan of Care (Signed)
Discussed with patient about being in the hospital and plan of care with no teach back displayed.

## 2018-01-30 LAB — CBC
HEMATOCRIT: 34.8 % — AB (ref 36.0–46.0)
HEMOGLOBIN: 10.8 g/dL — AB (ref 12.0–15.0)
MCH: 27.8 pg (ref 26.0–34.0)
MCHC: 31 g/dL (ref 30.0–36.0)
MCV: 89.7 fL (ref 78.0–100.0)
Platelets: 216 10*3/uL (ref 150–400)
RBC: 3.88 MIL/uL (ref 3.87–5.11)
RDW: 14.7 % (ref 11.5–15.5)
WBC: 13.5 10*3/uL — ABNORMAL HIGH (ref 4.0–10.5)

## 2018-01-30 LAB — BASIC METABOLIC PANEL
Anion gap: 8 (ref 5–15)
BUN: 42 mg/dL — AB (ref 6–20)
CHLORIDE: 104 mmol/L (ref 101–111)
CO2: 29 mmol/L (ref 22–32)
Calcium: 9.1 mg/dL (ref 8.9–10.3)
Creatinine, Ser: 1.98 mg/dL — ABNORMAL HIGH (ref 0.44–1.00)
GFR calc non Af Amer: 21 mL/min — ABNORMAL LOW (ref >60)
GFR, EST AFRICAN AMERICAN: 24 mL/min — AB (ref >60)
Glucose, Bld: 107 mg/dL — ABNORMAL HIGH (ref 65–99)
POTASSIUM: 5.4 mmol/L — AB (ref 3.5–5.1)
SODIUM: 141 mmol/L (ref 135–145)

## 2018-01-30 LAB — GLUCOSE, CAPILLARY
GLUCOSE-CAPILLARY: 115 mg/dL — AB (ref 65–99)
GLUCOSE-CAPILLARY: 166 mg/dL — AB (ref 65–99)
GLUCOSE-CAPILLARY: 194 mg/dL — AB (ref 65–99)
GLUCOSE-CAPILLARY: 234 mg/dL — AB (ref 65–99)
GLUCOSE-CAPILLARY: 79 mg/dL (ref 65–99)
Glucose-Capillary: 173 mg/dL — ABNORMAL HIGH (ref 65–99)
Glucose-Capillary: 102 mg/dL — ABNORMAL HIGH (ref 65–99)
Glucose-Capillary: 96 mg/dL (ref 65–99)
Glucose-Capillary: 138 mg/dL — ABNORMAL HIGH (ref 65–99)
Glucose-Capillary: 146 mg/dL — ABNORMAL HIGH (ref 65–99)

## 2018-01-30 MED ORDER — SODIUM POLYSTYRENE SULFONATE 15 GM/60ML PO SUSP
15.0000 g | Freq: Once | ORAL | Status: AC
  Administered 2018-01-30: 15 g via ORAL
  Filled 2018-01-30: qty 60

## 2018-01-30 MED ORDER — METHYLPREDNISOLONE SODIUM SUCC 125 MG IJ SOLR
60.0000 mg | INTRAMUSCULAR | Status: DC
  Administered 2018-01-31: 60 mg via INTRAVENOUS
  Filled 2018-01-30: qty 2

## 2018-01-30 NOTE — Telephone Encounter (Signed)
[place] Server of PM message. Nothing further is needed.

## 2018-01-30 NOTE — Telephone Encounter (Signed)
I spoke with the Dr. Maylene Roes, hospitalist at [facility]. They will try to get social services to work on getting her a home vent or bipap ordered from the hospital.

## 2018-01-30 NOTE — Progress Notes (Signed)
Patient is restless, agitated, and  disoriented.  States, "I have to get ready to go. Find my shoes and jacket so I won't be late to the clinic."  Increased shortness of breath.  O2 saturation down to 85% and increased coughing.  Given albuterol breathing treatment and haldol.

## 2018-01-30 NOTE — Evaluation (Signed)
Physical Therapy Evaluation Patient Details Name: Samantha Zamora MRN: [id number] DOB: 1924-01-16 Today's Date: 01/30/2018   History of Present Illness  Samantha Zamora is a 84 y.o.femalewith past medical history significant for allergic rhinitis, COPD, dementia, diabetes, reflux, hypertension and chronic respiratory failure on home oxygen. She present from nursing home with a chief complaint of shortness of breath. Patient's symptoms started acutely today. Per granddaughter at bedside, she was less responsive at nursing facility. In the ED, patient was found to be hypoxic and was placed on BiPAP. CXR was unremarkable for consolidation. ABG revealed respiratory acidosis. She was admitted for acute hypoxemic respiratory failure with acute COPD exacerbation.   Clinical Impression  Pt admitted with above diagnosis. Pt currently with functional limitations due to the deficits listed below (see PT Problem List). Pt was able to ambulate short distance with RW.  Cues for safety.  Will follow acutely.  Return to SNF for rehab is best option.  Pt will benefit from skilled PT to increase their independence and safety with mobility to allow discharge to the venue listed below.    SATURATION QUALIFICATIONS: (This note is used to comply with regulatory documentation for home oxygen)  Patient Saturations on Room Air at Rest = 86%  Patient Saturations on Room Air while Ambulating = 83%  Patient Saturations on 3 Liters of oxygen while Ambulating = 90%  Please briefly explain why patient needs home oxygen:Pt needing O2 at rest and incr O2 needs with activity.  Will need Home O2.    Follow Up Recommendations SNF;Supervision/Assistance - 24 hour    Equipment Recommendations  None recommended by PT    Recommendations for Other Services       Precautions / Restrictions Precautions Precautions: Fall Restrictions Weight Bearing Restrictions: No      Mobility  Bed Mobility Overal bed  mobility: Needs Assistance Bed Mobility: Supine to Sit     Supine to sit: Min assist     General bed mobility comments: incr time needed.  Transfers Overall transfer level: Needs assistance Equipment used: Rolling walker (2 wheeled) Transfers: Sit to/from Stand Sit to Stand: Min assist;Mod assist         General transfer comment: assist to power up and steady once up  Ambulation/Gait Ambulation/Gait assistance: Min assist Ambulation Distance (Feet): 5 Feet Assistive device: Rolling walker (2 wheeled) Gait Pattern/deviations: Step-through pattern;Decreased stride length;Staggering left;Staggering right;Drifts right/left;Wide base of support   Gait velocity interpretation: Below normal speed for age/gender General Gait Details: Pt was only able to ambulate a few steps to chair.Cues to  sequence steps and RW. Steering assist for RW as well with pt with overall decr postural stability. Incr trunk flexion.  Pt also desats.  See O2 note.   Stairs            Wheelchair Mobility    Modified Rankin (Stroke Patients Only)       Balance Overall balance assessment: Needs assistance Sitting-balance support: No upper extremity supported;Feet supported Sitting balance-Leahy Scale: Fair     Standing balance support: Bilateral upper extremity supported;During functional activity Standing balance-Leahy Scale: Poor Standing balance comment: relies on RW for balance and external support needed.                             Pertinent Vitals/Pain Pain Assessment: No/denies pain    Home Living Family/patient expects to be discharged to:: Skilled nursing facility   Available Help at Discharge: Family;Available PRN/intermittently(son works,  daughter in law in and out) Type of Home: House Home Access: Stairs to enter   [employer] of Steps: unsure Home Layout: One level Home Equipment: Environmental consultant - 4 wheels;Bedside commode;Wheelchair - manual(3.5 LO2 at home  PTA per chart) Additional Comments: Has been at [facility] and plan is to return    Prior Function Level of Independence: Needs assistance   Gait / Transfers Assistance Needed: Assist with RW  ADL's / Homemaking Assistance Needed: Assist with ADLs        Hand Dominance        Extremity/Trunk Assessment   Upper Extremity Assessment Upper Extremity Assessment: Defer to OT evaluation    Lower Extremity Assessment Lower Extremity Assessment: Generalized weakness    Cervical / Trunk Assessment Cervical / Trunk Assessment: Kyphotic  Communication   Communication: No difficulties  Cognition Arousal/Alertness: Awake/alert Behavior During Therapy: WFL for tasks assessed/performed Overall Cognitive Status: History of cognitive impairments - at baseline Area of Impairment: Orientation;Memory;Following commands;Safety/judgement;Awareness;Problem solving                 Orientation Level: Disoriented to;Time;Situation;Place     Following Commands: Follows one step commands with increased time Safety/Judgement: Decreased awareness of safety;Decreased awareness of deficits   Problem Solving: Decreased initiation;Difficulty sequencing;Requires verbal cues;Requires tactile cues General Comments: dementia      General Comments      Exercises     Assessment/Plan    PT Assessment Patient needs continued PT services  PT Problem List Decreased strength;Decreased activity tolerance;Decreased balance;Decreased mobility;Decreased safety awareness;Decreased knowledge of precautions;Decreased cognition;Decreased knowledge of use of DME;Cardiopulmonary status limiting activity       PT Treatment Interventions DME instruction;Gait training;Functional mobility training;Therapeutic activities;Therapeutic exercise;Balance training;Patient/family education    PT Goals (Current goals can be found in the Care Plan section)  Acute Rehab PT Goals Patient Stated Goal: to return to  [facility] PT Goal Formulation: With patient Time For Goal Achievement: 02/13/18 Potential to Achieve Goals: Good    Frequency Min 2X/week   Barriers to discharge Decreased caregiver support(son works and she is alone at times)      Co-evaluation               AM-PAC PT "6 Clicks" Daily Activity  Outcome Measure Difficulty turning over in bed (including adjusting bedclothes, sheets and blankets)?: A Lot Difficulty moving from lying on back to sitting on the side of the bed? : A Lot Difficulty sitting down on and standing up from a chair with arms (e.g., wheelchair, bedside commode, etc,.)?: A Lot Help needed moving to and from a bed to chair (including a wheelchair)?: A Lot Help needed walking in hospital room?: A Lot Help needed climbing 3-5 steps with a railing? : Total 6 Click Score: 11    End of Session Equipment Utilized During Treatment: Gait belt;Oxygen Activity Tolerance: Patient limited by fatigue Patient left: in chair;with call bell/phone within reach Nurse Communication: Mobility status PT Visit Diagnosis: Muscle weakness (generalized) (M62.81);History of falling (Z91.81);Unsteadiness on feet (R26.81)    Time: 4481-8563 PT Time Calculation (min) (ACUTE ONLY): 21 min   Charges:   PT Evaluation $PT Eval Moderate Complexity: 1 Mod     PT G Codes:        Abdulrahman Bracey,PT Acute Rehabilitation [phone] (pager)   Denice Paradise 01/30/2018, 10:22 AM

## 2018-01-30 NOTE — Telephone Encounter (Signed)
Left message on machine for Samantha Zamora to return call.

## 2018-01-30 NOTE — Progress Notes (Signed)
PROGRESS NOTE    Samantha Zamora  WEX:[id number] DOB: 1924-01-02 DOA: 01/27/2018 PCP: Flossie Buffy, NP     Brief Narrative:  Samantha Zamora is a 84 y.o. female with past medical history significant for allergic rhinitis, COPD, dementia, diabetes, reflux, hypertension and chronic respiratory failure on home oxygen.  She present from nursing home with a chief complaint of shortness of breath.  Patient's symptoms started acutely today. Per granddaughter at bedside, she was less responsive at nursing facility. In the ED, patient was found to be hypoxic and was placed on BiPAP. CXR was unremarkable for consolidation. ABG revealed respiratory acidosis. She was admitted for acute hypoxemic respiratory failure with acute COPD exacerbation.   Assessment & Plan:   Active Problems:   Acute respiratory failure (HCC)  Acute hypoxemic and hypercapnic respiratory failure secondary to COPD exacerbation, with chronic hypoxemic respiratory failure  -Now off BiPAP. Continue [place] O2. Baseline 3L [place] O2  -CXR reviewed personally, no acute pulmonary process  -Influenza positive 2 weeks ago and was started on tamiflu. Respiratory viral panel negative.  -Azithromycin  -Solumedrol, nebs. Deescalate solumedrol dose  -Discussed with patient's pulmonologist Dr. Vaughan Browner over the phone 3/4. This is patient's second hospitalization in the past month (third ED evaluation in the past month) and has required BiPAP both hospitalization. He is in agreement in setting up for home NIV or BiPAP at time of discharge. Discussed with CM and SW this morning, SNF can take patient with DME BiPAP order. This order is placed today.   HTN -Hydralazine IV prn for SBP >180    DM -SSI   AKI  -Baseline Cr 0.6-0.7 -Renal US with bilateral renal collecting system calcification, mild dilation of right renal pelvis without caliectasis.  -IVF. Continue IVF today, Cr slowly improving. UOP adequate   Dementia with behavioral  disturbance -Per granddaughter, at baseline, she is conversational, able to feed herself. She is quite forgetful and forgets place and time. She does sometimes get agitated and delirious.  -Continue home remeron, haldol, ativan, trazodone  -Stable   Hyperkalemia -EKG obtained and personally reviewed. Sinus tachycardia without PR prolongation, T wave peak. Kayexalate ordered. Repeat K this afternoon.    DVT prophylaxis: Heparin subq  Code Status: DNR Family Communication: Spoke with son over the phone today  Disposition Plan: Pending improvement in kidney function, return to SNF     Consultants:   None  Procedures:   None   Antimicrobials:  Anti-infectives (From admission, onward)   Start     Dose/Rate Route Frequency Ordered Stop   01/30/18 1000  azithromycin (ZITHROMAX) tablet 500 mg     500 mg Oral Daily 01/29/18 1013     01/28/18 1500  levofloxacin (LEVAQUIN) IVPB 500 mg  Status:  Discontinued     500 mg 100 mL/hr over 60 Minutes Intravenous Every 48 hours 01/27/18 1605 01/28/18 0812   01/28/18 0900  azithromycin (ZITHROMAX) 500 mg in sodium chloride 0.9 % 250 mL IVPB  Status:  Discontinued     500 mg 250 mL/hr over 60 Minutes Intravenous Daily 01/28/18 0812 01/29/18 1013   01/27/18 1415  levofloxacin (LEVAQUIN) IVPB 750 mg     750 mg 100 mL/hr over 90 Minutes Intravenous  Once 01/27/18 1410 01/27/18 1626       Subjective: No acute issues or complaints. Eating breakfast.   Objective: Vitals:   01/30/18 0457 01/30/18 0555 01/30/18 0724 01/30/18 0830  BP:  (!) 170/81 (!) 154/93   Pulse:   100  Resp:   20   Temp: 97.8 F (36.6 C)  98 F (36.7 C)   TempSrc: Oral  Axillary   SpO2: 93% 95% 95% 94%  Weight:      Height:        Intake/Output Summary (Last 24 hours) at 01/30/2018 1118 Last data filed at 01/30/2018 0500 Gross per 24 hour  Intake 2663.33 ml  Output 850 ml  Net 1813.33 ml   Filed Weights   01/28/18 0445 01/29/18 0355 01/30/18 0221  Weight: 68  kg (149 lb 14.6 oz) 67.6 kg (149 lb 0.5 oz) 50.5 kg (111 lb 5.3 oz)    Examination: General exam: Appears calm and comfortable  Respiratory system: Clear to auscultation. Respiratory effort normal. Cardiovascular system: S1 & S2 heard, tachycardic, regular rhythm. No JVD, murmurs, rubs, gallops or clicks. No pedal edema. Gastrointestinal system: Abdomen is nondistended, soft and nontender. No organomegaly or masses felt. Normal bowel sounds heard. Central nervous system: Alert Extremities: Symmetric 5 x 5 power. Skin: No rashes, lesions or ulcers Psychiatry: Dementia   Data Reviewed: I have personally reviewed following labs and imaging studies  CBC: Recent Labs  Lab 01/27/18 1152 01/28/18 0233 01/29/18 0250 01/30/18 0442  WBC 11.2* 8.3 15.4* 13.5*  NEUTROABS 8.5*  --   --   --   HGB 11.1* 9.8* 9.4* 10.8*  HCT 37.1 32.8* 30.1* 34.8*  MCV 93.7 91.9 89.9 89.7  PLT 163 214 198 811   Basic Metabolic Panel: Recent Labs  Lab 01/27/18 1302 01/27/18 1556 01/27/18 2050 01/28/18 0233 01/29/18 0250 01/30/18 0442  NA 140  --  138 137 140 141  K 5.0  --  5.5* 5.1 4.9 5.4*  CL 98*  --  100* 99* 103 104  CO2 32  --  26 29 30 29   GLUCOSE 101*  --  162* 164* 139* 107*  BUN 43*  --  42* 45* 44* 42*  CREATININE 2.26*  --  2.11* 2.22* 2.14* 1.98*  CALCIUM 9.1  --  8.6* 8.8* 8.8* 9.1  MG  --  2.3  --   --   --   --   PHOS  --  4.1  --   --   --   --    GFR: Estimated Creatinine Clearance: 14.8 mL/min (A) (by C-G formula based on SCr of 1.98 mg/dL (H)). Liver Function Tests: No results for input(s): AST, ALT, ALKPHOS, BILITOT, PROT, ALBUMIN in the last 168 hours. No results for input(s): LIPASE, AMYLASE in the last 168 hours. No results for input(s): AMMONIA in the last 168 hours. Coagulation Profile: No results for input(s): INR, PROTIME in the last 168 hours. Cardiac Enzymes: No results for input(s): CKTOTAL, CKMB, CKMBINDEX, TROPONINI in the last 168 hours. BNP (last 3  results) No results for input(s): PROBNP in the last 8760 hours. HbA1C: No results for input(s): HGBA1C in the last 72 hours. CBG: Recent Labs  Lab 01/29/18 1622 01/29/18 1934 01/29/18 2335 01/30/18 0456 01/30/18 0721  GLUCAP 161* 119* 99 115* 234*   Lipid Profile: No results for input(s): CHOL, HDL, LDLCALC, TRIG, CHOLHDL, LDLDIRECT in the last 72 hours. Thyroid Function Tests: No results for input(s): TSH, T4TOTAL, FREET4, T3FREE, THYROIDAB in the last 72 hours. Anemia Panel: No results for input(s): VITAMINB12, FOLATE, FERRITIN, TIBC, IRON, RETICCTPCT in the last 72 hours. Sepsis Labs: Recent Labs  Lab 01/27/18 1609  LATICACIDVEN 2.01*    Recent Results (from the past 240 hour(s))  MRSA PCR Screening  Status: None   Collection Time: 01/27/18  5:44 PM  Result Value Ref Range Status   MRSA by PCR NEGATIVE NEGATIVE Final    Comment:        The GeneXpert MRSA Assay (FDA approved for NASAL specimens only), is one component of a comprehensive MRSA colonization surveillance program. It is not intended to diagnose MRSA infection nor to guide or monitor treatment for MRSA infections. Performed at [facility], [place] [address], [place], [place] [address]   Respiratory Panel by PCR     Status: None   Collection Time: 01/27/18 11:37 PM  Result Value Ref Range Status   Adenovirus NOT DETECTED NOT DETECTED Final   Coronavirus 229E NOT DETECTED NOT DETECTED Final   Coronavirus HKU1 NOT DETECTED NOT DETECTED Final   Coronavirus NL63 NOT DETECTED NOT DETECTED Final   Coronavirus OC43 NOT DETECTED NOT DETECTED Final   Metapneumovirus NOT DETECTED NOT DETECTED Final   Rhinovirus / Enterovirus NOT DETECTED NOT DETECTED Final   Influenza A NOT DETECTED NOT DETECTED Final   Influenza B NOT DETECTED NOT DETECTED Final   Parainfluenza Virus 1 NOT DETECTED NOT DETECTED Final   Parainfluenza Virus 2 NOT DETECTED NOT DETECTED Final   Parainfluenza Virus 3 NOT DETECTED NOT  DETECTED Final   Parainfluenza Virus 4 NOT DETECTED NOT DETECTED Final   Respiratory Syncytial Virus NOT DETECTED NOT DETECTED Final   Bordetella pertussis NOT DETECTED NOT DETECTED Final   Chlamydophila pneumoniae NOT DETECTED NOT DETECTED Final   Mycoplasma pneumoniae NOT DETECTED NOT DETECTED Final    Comment: Performed at [facility], [place] [address] [place], [place] [address]       Radiology Studies: US Renal  Result Date: 01/29/2018 CLINICAL DATA:  Acute kidney injury. EXAM: RENAL / URINARY TRACT ULTRASOUND COMPLETE COMPARISON:  None. FINDINGS: Right Kidney: Length: 12.1 cm. Normal parenchymal echogenicity. Mild dilation of the renal pelvis. No caliectasis. There are multiple calcifications, largest arises from the lower pole measuring 2 cm. There are 2 renal sinus cysts, the more superior measuring 2.0 x 1.5 x 2.0 cm in the more inferior and larger measuring 2.5 x 1.8 x 2.3 cm. Left Kidney: Length: 12.1 cm. Normal parenchymal echogenicity. No hydronephrosis. There are several intrarenal calculi, largest from the mid to lower pole measuring 15 mm. There is a simple cyst arising from the upper pole measuring 3.7 x 3.8 x 3.1 cm. No other masses. Bladder: Not visualized. IMPRESSION: 1. There are multiple bilateral renal collecting system calcifications, largest on the right measuring 2 cm and largest on the left measuring 1.5 cm. On the right, there is mild dilation of the right renal pelvis without caliectasis. No left hydronephrosis. 2. Bilateral renal cysts. 3. No other abnormalities. Electronically Signed   By: Lajean Manes M.D.   On: 01/29/2018 10:55      Scheduled Meds: . aspirin EC  81 mg Oral Daily  . azithromycin  500 mg Oral Daily  . heparin  5,000 Units Subcutaneous Q8H  . insulin aspart  0-9 Units Subcutaneous Q4H  . ipratropium-albuterol  3 mL Nebulization BID  . methylPREDNISolone (SOLU-MEDROL) injection  80 mg Intravenous Q12H  . mirtazapine  15 mg Oral QHS    Continuous Infusions: . sodium chloride 100 mL/hr at 01/30/18 0231     LOS: 3 days    Time spent: 30 minutes   Dessa Phi, DO [facility] [email or web address] Password [email or web address] 01/30/2018, 11:18 AM

## 2018-01-30 NOTE — Progress Notes (Signed)
MD informed CSW that pt will need bipap at time of DC- CSW requested DME bipap order with settings so this could be sent to SNF so they can order  Bipap order complete and CSW faxed to [facility]  CSW will continue to follow  Jorge Ny, [place] Social Worker [phone]

## 2018-01-30 NOTE — Progress Notes (Addendum)
CM consult in place for Trilogy set up post SNF DC. Referral placed to Ronnell [place] with [facility] [phone]. Per Jeneen Rinks patient meets qualifications. He will be by hospital tomorrow for clinicals and to speak to patient. He understands that plan is for patient to DC to SNF w bipap, then to home w trilogy.   01/31/18 ORDER for TRILOGY on chart. MD to sign. FAX to Ronnell Guadalajara at [phone]. Then call Jeneen Rinks at [phone] to notify him of patient's DC and to what facility she goes to for SNF so he can follow her and make sure she gets Trilogy after released from SNF.

## 2018-01-30 NOTE — Progress Notes (Signed)
Patient up in chair.  O2 saturation 100% respirations 18, unlabored.  Patient is sleeping off and on in chair. States, " I don't want to lay down in the bed yet."  She is much calmer now.  Call bell in reach. RN in room with patient.  Will continue to monitor.

## 2018-01-30 NOTE — Progress Notes (Signed)
SATURATION QUALIFICATIONS: (This note is used to comply with regulatory documentation for home oxygen)  Patient Saturations on Room Air at Rest = 86%  Patient Saturations on Room Air while Ambulating = 83%  Patient Saturations on 3 Liters of oxygen while Ambulating = 90%  Please briefly explain why patient needs home oxygen:Pt needing O2 at rest and incr O2 needs with activity.  Will need Home O2. Thanks. [place] [phone] (pager)

## 2018-01-31 ENCOUNTER — Inpatient Hospital Stay (HOSPITAL_COMMUNITY): Payer: Medicare Other

## 2018-01-31 DIAGNOSIS — J9601 Acute respiratory failure with hypoxia: Secondary | ICD-10-CM

## 2018-01-31 LAB — CBC
HCT: 33.4 % — ABNORMAL LOW (ref 36.0–46.0)
Hemoglobin: 10.5 g/dL — ABNORMAL LOW (ref 12.0–15.0)
MCH: 28 pg (ref 26.0–34.0)
MCHC: 31.4 g/dL (ref 30.0–36.0)
MCV: 89.1 fL (ref 78.0–100.0)
PLATELETS: 158 10*3/uL (ref 150–400)
RBC: 3.75 MIL/uL — ABNORMAL LOW (ref 3.87–5.11)
RDW: 14.5 % (ref 11.5–15.5)
WBC: 10.8 10*3/uL — ABNORMAL HIGH (ref 4.0–10.5)

## 2018-01-31 LAB — BASIC METABOLIC PANEL
Anion gap: 10 (ref 5–15)
BUN: 35 mg/dL — AB (ref 6–20)
CALCIUM: 8.7 mg/dL — AB (ref 8.9–10.3)
CO2: 27 mmol/L (ref 22–32)
CREATININE: 1.86 mg/dL — AB (ref 0.44–1.00)
Chloride: 104 mmol/L (ref 101–111)
GFR calc non Af Amer: 23 mL/min — ABNORMAL LOW (ref >60)
GFR, EST AFRICAN AMERICAN: 26 mL/min — AB (ref >60)
Glucose, Bld: 94 mg/dL (ref 65–99)
Potassium: 4.5 mmol/L (ref 3.5–5.1)
SODIUM: 141 mmol/L (ref 135–145)

## 2018-01-31 LAB — GLUCOSE, CAPILLARY
GLUCOSE-CAPILLARY: 106 mg/dL — AB (ref 65–99)
GLUCOSE-CAPILLARY: 145 mg/dL — AB (ref 65–99)
GLUCOSE-CAPILLARY: 168 mg/dL — AB (ref 65–99)
Glucose-Capillary: 77 mg/dL (ref 65–99)
Glucose-Capillary: 94 mg/dL (ref 65–99)
Glucose-Capillary: 220 mg/dL — ABNORMAL HIGH (ref 65–99)

## 2018-01-31 MED ORDER — PREDNISONE 20 MG PO TABS
40.0000 mg | ORAL_TABLET | Freq: Every day | ORAL | Status: DC
  Administered 2018-02-02: 40 mg via ORAL
  Filled 2018-01-31: qty 2

## 2018-01-31 MED ORDER — DILTIAZEM HCL 25 MG/5ML IV SOLN
10.0000 mg | Freq: Once | INTRAVENOUS | Status: AC
  Administered 2018-01-31: 10 mg via INTRAVENOUS
  Filled 2018-01-31: qty 5

## 2018-01-31 NOTE — Progress Notes (Addendum)
PROGRESS NOTE    Samantha Zamora  UKG:[id number] DOB: August 15, 1924 DOA: 01/27/2018 PCP: Flossie Buffy, NP     Brief Narrative:  Samantha Zamora is a 84 y.o. female with past medical history significant for allergic rhinitis, COPD, dementia, diabetes, reflux, hypertension and chronic respiratory failure on home oxygen.  She present from nursing home with a chief complaint of shortness of breath.  Patient's symptoms started acutely today. Per granddaughter at bedside, she was less responsive at nursing facility. In the ED, patient was found to be hypoxic and was placed on BiPAP. CXR was unremarkable for consolidation. ABG revealed respiratory acidosis. She was admitted for acute hypoxemic respiratory failure with acute COPD exacerbation.   Assessment & Plan:  Acute hypoxemic and hypercapnic respiratory failure secondary to COPD exacerbation, with chronic hypoxemic respiratory failure  -off BiPAP. Continue [place] O2. Baseline 3L [place] O2  -CXR -no acute pulmonary process  -Influenza positive 2 weeks ago and was started on tamiflu. Respiratory viral panel negative.  -5days of abx and taper steroids, no wheezing noted Dr.CHoi discussed with patient's pulmonologist Dr. Vaughan Browner over the phone 3/4. This is patient's second hospitalization in the past month (third ED evaluation in the past month) and has required BiPAP both hospitalization. He was in agreement in setting up for home NIV or BiPAP at time of discharge. CM working on Trilogy at [facility]  Dementia with behavioral disturbance -Per granddaughter, at baseline, she is conversational, able to feed herself. She is quite forgetful and forgets place and time. She does sometimes get agitated and delirious.  -Continue home remeron, haldol, ativan, trazodone  -intermittently agitated, likley steroids contributing, taper off quickly  HTN -Hydralazine IV prn for SBP >180    DM -SSI   AKI  -Baseline Cr 0.6-0.7 -Renal US with bilateral renal collecting  system calcification, mild dilation of right renal pelvis without caliectasis.  -stop IVF, creat improving  Hyperkalemia -resolved with Kayexalate   DVT prophylaxis: Heparin subq  Code Status: DNR Family Communication: Spoke with son over the phone today  Disposition Plan: SNF when agitated controlled and SNF searc and h   Consultants:   None  Procedures:   None   Antimicrobials:  Anti-infectives (From admission, onward)   Start     Dose/Rate Route Frequency Ordered Stop   01/30/18 1000  azithromycin (ZITHROMAX) tablet 500 mg     500 mg Oral Daily 01/29/18 1013     01/28/18 1500  levofloxacin (LEVAQUIN) IVPB 500 mg  Status:  Discontinued     500 mg 100 mL/hr over 60 Minutes Intravenous Every 48 hours 01/27/18 1605 01/28/18 0812   01/28/18 0900  azithromycin (ZITHROMAX) 500 mg in sodium chloride 0.9 % 250 mL IVPB  Status:  Discontinued     500 mg 250 mL/hr over 60 Minutes Intravenous Daily 01/28/18 0812 01/29/18 1013   01/27/18 1415  levofloxacin (LEVAQUIN) IVPB 750 mg     750 mg 100 mL/hr over 90 Minutes Intravenous  Once 01/27/18 1410 01/27/18 1626       Subjective: -Agitated, trying to get out of bed and walk around  Objective: Vitals:   01/31/18 0800 01/31/18 0852 01/31/18 1000 01/31/18 1120  BP:  (!) 127/96  111/63  Pulse: (!) 142 98 89 99  Resp: (!) 30 (!) 22 (!) 28 (!) 22  Temp:  98.7 F (37.1 C)  98 F (36.7 C)  TempSrc:  Oral  Oral  SpO2: 95% 99% (!) 88% 98%  Weight:      Height:  Intake/Output Summary (Last 24 hours) at 01/31/2018 1317 Last data filed at 01/31/2018 0500 Gross per 24 hour  Intake 1540 ml  Output 700 ml  Net 840 ml   Filed Weights   01/28/18 0445 01/29/18 0355 01/30/18 0221  Weight: 68 kg (149 lb 14.6 oz) 67.6 kg (149 lb 0.5 oz) 50.5 kg (111 lb 5.3 oz)    Examination: Gen: Elderly, frail, chronically ill-appearing female laying in bed, no distress, confused HEENT: PERRLA, Neck supple, no JVD Lungs: Clear  bilaterally CVS: S1-S2/regular rate rhythm Abd: soft, Non tender, non distended, BS present Extremities: No Cyanosis, Clubbing or edema Skin: no new rashes Psychiatry: Dementia   Data Reviewed: I have personally reviewed following labs and imaging studies  CBC: Recent Labs  Lab 01/27/18 1152 01/28/18 0233 01/29/18 0250 01/30/18 0442 01/31/18 0336  WBC 11.2* 8.3 15.4* 13.5* 10.8*  NEUTROABS 8.5*  --   --   --   --   HGB 11.1* 9.8* 9.4* 10.8* 10.5*  HCT 37.1 32.8* 30.1* 34.8* 33.4*  MCV 93.7 91.9 89.9 89.7 89.1  PLT 163 214 198 216 620   Basic Metabolic Panel: Recent Labs  Lab 01/27/18 1556 01/27/18 2050 01/28/18 0233 01/29/18 0250 01/30/18 0442 01/31/18 0336  NA  --  138 137 140 141 141  K  --  5.5* 5.1 4.9 5.4* 4.5  CL  --  100* 99* 103 104 104  CO2  --  26 29 30 29 27   GLUCOSE  --  162* 164* 139* 107* 94  BUN  --  42* 45* 44* 42* 35*  CREATININE  --  2.11* 2.22* 2.14* 1.98* 1.86*  CALCIUM  --  8.6* 8.8* 8.8* 9.1 8.7*  MG 2.3  --   --   --   --   --   PHOS 4.1  --   --   --   --   --    GFR: Estimated Creatinine Clearance: 15.7 mL/min (A) (by C-G formula based on SCr of 1.86 mg/dL (H)). Liver Function Tests: No results for input(s): AST, ALT, ALKPHOS, BILITOT, PROT, ALBUMIN in the last 168 hours. No results for input(s): LIPASE, AMYLASE in the last 168 hours. No results for input(s): AMMONIA in the last 168 hours. Coagulation Profile: No results for input(s): INR, PROTIME in the last 168 hours. Cardiac Enzymes: No results for input(s): CKTOTAL, CKMB, CKMBINDEX, TROPONINI in the last 168 hours. BNP (last 3 results) No results for input(s): PROBNP in the last 8760 hours. HbA1C: No results for input(s): HGBA1C in the last 72 hours. CBG: Recent Labs  Lab 01/30/18 2038 01/31/18 0032 01/31/18 0349 01/31/18 0856 01/31/18 1118  GLUCAP 146* 106* 77 94 220*   Lipid Profile: No results for input(s): CHOL, HDL, LDLCALC, TRIG, CHOLHDL, LDLDIRECT in the last 72  hours. Thyroid Function Tests: No results for input(s): TSH, T4TOTAL, FREET4, T3FREE, THYROIDAB in the last 72 hours. Anemia Panel: No results for input(s): VITAMINB12, FOLATE, FERRITIN, TIBC, IRON, RETICCTPCT in the last 72 hours. Sepsis Labs: Recent Labs  Lab 01/27/18 1609  LATICACIDVEN 2.01*    Recent Results (from the past 240 hour(s))  MRSA PCR Screening     Status: None   Collection Time: 01/27/18  5:44 PM  Result Value Ref Range Status   MRSA by PCR NEGATIVE NEGATIVE Final    Comment:        The GeneXpert MRSA Assay (FDA approved for NASAL specimens only), is one component of a comprehensive MRSA colonization surveillance program. It  is not intended to diagnose MRSA infection nor to guide or monitor treatment for MRSA infections. Performed at [facility], [place] [address], [place], [place] [address]   Respiratory Panel by PCR     Status: None   Collection Time: 01/27/18 11:37 PM  Result Value Ref Range Status   Adenovirus NOT DETECTED NOT DETECTED Final   Coronavirus 229E NOT DETECTED NOT DETECTED Final   Coronavirus HKU1 NOT DETECTED NOT DETECTED Final   Coronavirus NL63 NOT DETECTED NOT DETECTED Final   Coronavirus OC43 NOT DETECTED NOT DETECTED Final   Metapneumovirus NOT DETECTED NOT DETECTED Final   Rhinovirus / Enterovirus NOT DETECTED NOT DETECTED Final   Influenza A NOT DETECTED NOT DETECTED Final   Influenza B NOT DETECTED NOT DETECTED Final   Parainfluenza Virus 1 NOT DETECTED NOT DETECTED Final   Parainfluenza Virus 2 NOT DETECTED NOT DETECTED Final   Parainfluenza Virus 3 NOT DETECTED NOT DETECTED Final   Parainfluenza Virus 4 NOT DETECTED NOT DETECTED Final   Respiratory Syncytial Virus NOT DETECTED NOT DETECTED Final   Bordetella pertussis NOT DETECTED NOT DETECTED Final   Chlamydophila pneumoniae NOT DETECTED NOT DETECTED Final   Mycoplasma pneumoniae NOT DETECTED NOT DETECTED Final    Comment: Performed at [facility], [place]  [address], [place], [place] [address]       Radiology Studies: Dg Chest Port 1 View  Result Date: 01/31/2018 CLINICAL DATA:  Tachycardia and shortness of breath with congestion. EXAM: PORTABLE CHEST 1 VIEW COMPARISON:  01/28/2018. FINDINGS: Trachea is midline. Heart size stable. Thoracic aorta is calcified. Lungs are hyperinflated but clear. No pleural fluid. Old anterior right rib fractures simulate pulmonary nodules. IMPRESSION: 1. Hyperinflation without acute finding. 2.  Aortic atherosclerosis (ICD10-170.0). Electronically Signed   By: Lorin Picket M.D.   On: 01/31/2018 09:03      Scheduled Meds: . aspirin EC  81 mg Oral Daily  . azithromycin  500 mg Oral Daily  . heparin  5,000 Units Subcutaneous Q8H  . insulin aspart  0-9 Units Subcutaneous Q4H  . ipratropium-albuterol  3 mL Nebulization BID  . mirtazapine  15 mg Oral QHS  . [START ON 02/01/2018] predniSONE  40 mg Oral Q breakfast   Continuous Infusions:    LOS: 4 days    Time spent: 30 minutes   Domenic Polite, MD [facility] [email or web address] Password [email or web address] 01/31/2018, 1:17 PM

## 2018-01-31 NOTE — Progress Notes (Signed)
CSW received call from DON at [facility] stating pt cannot return due to behavior concerns (was yelling while at facility) despite CSW being informed by admissions coordinator on 3/4 that there would not be issues with return.  CSW attempted to call pt son to discuss- left message- also asked DON to call son to explain why there are unable to take back  CSW initiating search for alternative facility and will follow up with family regarding options.  Jorge Ny, LCSW Clinical Social Worker [phone]

## 2018-01-31 NOTE — Care Management (Cosign Needed Addendum)
Patient continues to exhibit signs of hypercapnia associated with chronic respiratory failure secondary to severe COPD. Patient requires the use of NIV both at  to help with exacerbation periods. The use of the NIV will treat both the patients high PCO2 levels and can reduce the risk of exacerbations and future hospitalizations when used at night and during the day. The patient will need these advanced settings in conjunction with their current medication regimen; BIPAP is not an option due to its functional limitations and the severity of the patient's condition. Failure to have NIV available for use over a 24 hour period could lead to death.

## 2018-01-31 NOTE — Progress Notes (Signed)
Pt HR sustaining in 130s to 140s reaching as high as 143 bpm. Pt extremely anxious and trying to get out of bed stating, "I need to put on my shoes and go to the clinic." Pt also stating, "Help me, I can't breathe." Pt O2 saturation is 100% on 2 liters nasal cannula. NP [place] notified.

## 2018-01-31 NOTE — Social Work (Addendum)
CSW spoke with pt daughter in law and pt son. Pt son expressed disappointment and frustration regarding pt not being able to return to [facility]. CSW provided packet with other SNF offers to daughter in law. They will look at offers and f/u with CSW. Pt son would also like to speak with RN Case Manager if they don't accept any other SNF offer.   3:20pm- CSW spoke with pt son via phone, pt son states he would like pt clinicals sent to [facility] will do so. CSW did explain that memory care/ALF placements are generally not facilitated by hospital CSW's, pt son and daughter in law also [place]. Pt son again requested to speak with RN Case Manager in case they take pt home.   CSW alerted RN Case Manager.   CSW continuing to follow.   Alexander Mt, [place] Work [phone]

## 2018-01-31 NOTE — Progress Notes (Signed)
Family looking at SNFs today as they are no longer able to [facility] at DC. They state that if they do not like anything facilities they will DC to home w [place]. CM discussed different level of support between [facility] and SNF. Discussed the possibility of [facility] w [facility]. Patient is next gen w [facility], placed referral to [facility] to see if patient would be a candidate. If DC'd to home would need a WC.

## 2018-02-01 DIAGNOSIS — F0391 Unspecified dementia with behavioral disturbance: Secondary | ICD-10-CM

## 2018-02-01 DIAGNOSIS — D696 Thrombocytopenia, unspecified: Secondary | ICD-10-CM

## 2018-02-01 DIAGNOSIS — J441 Chronic obstructive pulmonary disease with (acute) exacerbation: Secondary | ICD-10-CM | POA: Diagnosis present

## 2018-02-01 DIAGNOSIS — J9611 Chronic respiratory failure with hypoxia: Secondary | ICD-10-CM | POA: Diagnosis present

## 2018-02-01 DIAGNOSIS — R319 Hematuria, unspecified: Secondary | ICD-10-CM | POA: Diagnosis present

## 2018-02-01 DIAGNOSIS — N179 Acute kidney failure, unspecified: Secondary | ICD-10-CM | POA: Diagnosis present

## 2018-02-01 DIAGNOSIS — E119 Type 2 diabetes mellitus without complications: Secondary | ICD-10-CM

## 2018-02-01 DIAGNOSIS — J9621 Acute and chronic respiratory failure with hypoxia: Secondary | ICD-10-CM | POA: Diagnosis present

## 2018-02-01 DIAGNOSIS — J9612 Chronic respiratory failure with hypercapnia: Secondary | ICD-10-CM

## 2018-02-01 DIAGNOSIS — J9622 Acute and chronic respiratory failure with hypercapnia: Secondary | ICD-10-CM | POA: Diagnosis not present

## 2018-02-01 LAB — BASIC METABOLIC PANEL
ANION GAP: 7 (ref 5–15)
BUN: 32 mg/dL — ABNORMAL HIGH (ref 6–20)
CO2: 32 mmol/L (ref 22–32)
Calcium: 8.5 mg/dL — ABNORMAL LOW (ref 8.9–10.3)
Chloride: 103 mmol/L (ref 101–111)
Creatinine, Ser: 1.77 mg/dL — ABNORMAL HIGH (ref 0.44–1.00)
GFR calc Af Amer: 28 mL/min — ABNORMAL LOW (ref >60)
GFR, EST NON AFRICAN AMERICAN: 24 mL/min — AB (ref >60)
Glucose, Bld: 97 mg/dL (ref 65–99)
POTASSIUM: 4.4 mmol/L (ref 3.5–5.1)
SODIUM: 142 mmol/L (ref 135–145)

## 2018-02-01 LAB — GLUCOSE, CAPILLARY
GLUCOSE-CAPILLARY: 107 mg/dL — AB (ref 65–99)
GLUCOSE-CAPILLARY: 138 mg/dL — AB (ref 65–99)
GLUCOSE-CAPILLARY: 87 mg/dL (ref 65–99)
Glucose-Capillary: 88 mg/dL (ref 65–99)
Glucose-Capillary: 147 mg/dL — ABNORMAL HIGH (ref 65–99)
Glucose-Capillary: 123 mg/dL — ABNORMAL HIGH (ref 65–99)

## 2018-02-01 LAB — CBC
HCT: 33.2 % — ABNORMAL LOW (ref 36.0–46.0)
Hemoglobin: 10.2 g/dL — ABNORMAL LOW (ref 12.0–15.0)
MCH: 27.8 pg (ref 26.0–34.0)
MCHC: 30.7 g/dL (ref 30.0–36.0)
MCV: 90.5 fL (ref 78.0–100.0)
Platelets: 142 10*3/uL — ABNORMAL LOW (ref 150–400)
RBC: 3.67 MIL/uL — AB (ref 3.87–5.11)
RDW: 14.9 % (ref 11.5–15.5)
WBC: 8.6 10*3/uL (ref 4.0–10.5)

## 2018-02-01 MED ORDER — SODIUM CHLORIDE 0.9 % IV SOLN
INTRAVENOUS | Status: DC
  Administered 2018-02-01 – 2018-02-02 (×3): via INTRAVENOUS

## 2018-02-01 NOTE — Progress Notes (Addendum)
  Family has chosen [facility]- per MD do not anticipate patient will be stable for DC today  CSW will continue to follow and assist with transfer when stable  Jorge Ny, [place] Social Worker [phone]

## 2018-02-01 NOTE — Progress Notes (Signed)
  PROGRESS NOTE  Samantha Zamora ACZ:[id number] DOB: February 21, 1924 DOA: 01/27/2018 PCP: Flossie Buffy, NP  Brief Narrative: 35yow PMH COPD on oxygen, dementia, DM presented with SOB, admitted for acute resp failure on BiPAP, COPD exacerbation  Assessment/Plan Acute on chronic hypoxic resp failure, acute hypercapnic resp failure secondary to COPD exacerbation. BiPAP on admission. CXR negative. Resp panel negative. Treated with azithromycin, steroids, nebs. Dr. Maylene Roes d/w Dr. Vaughan Browner who rec setting up for outpatient NIV or BiPAP. --afebrile, VSS. Respiratory likely at baseline --continue oral steroids, nebs, oxygen  AKI --creatinine improving but still significantly above baseline --IVF, recheck BMP in AM  Thrombocytopenia --seen 12/2017. Minimal today. Check CBC in AM  Chronic normocytic anemia --stable. Follow-up as an outpatient.  DM type 2 --CBG stable --continue SSI  Hematuria --recheck u/a  Dementia with behavioral disturbance, acute on chronic, possibly fron steroids --continue Remeron, Haldol, Ativan, trazodone --appears stable   If creatinine improved can likely go to [facility] 3/8  DVT prophylaxis: heparin Code Status: DNR Family Communication: none Disposition Plan: SNF    Murray Hodgkins, MD  [facility] Direct contact: [phone] --Via amion app OR  --[email or web address]; password [email or web address]  7PM-7AM contact night coverage as above 02/01/2018, 2:13 PM  LOS: 5 days   Consultants:    Procedures:    Antimicrobials:  Azithromycin 3/3 >>   Interval history/Subjective: Feels fine, no complaints, hx not reliable secondary to dementia Ate well this afternoon per RN, slept all morning.  Objective: Vitals:  Vitals:   02/01/18 0830 02/01/18 1241  BP:  (!) 141/69  Pulse:  80  Resp:  19  Temp:  98.9 F (37.2 C)  SpO2: 99% 100%    Exam:  Constitutional:  . Appears calm and comfortable Eyes:  . pupils and irises appear normal . Normal lids ENMT:    . Hard of hearing Respiratory:  . CTA bilaterally but breath sounds diminished, no w/r/r.  . Respiratory effort normal Cardiovascular:  . RRR, 3/5 systolic murmur LUSB, no r/g . No LE extremity edema   Abdomen:  . Soft, ntnd Musculoskeletal:  . Moves all extremities Psychiatric:  . Mental status o Mood, affect appropriate  I have personally reviewed the following:   Labs:  Creatinine 1.86 >. 1.77  BUN 35 >> 32  HGb 10.2 stable  Plts 142   Scheduled Meds: . aspirin EC  81 mg Oral Daily  . azithromycin  500 mg Oral Daily  . heparin  5,000 Units Subcutaneous Q8H  . insulin aspart  0-9 Units Subcutaneous Q4H  . mirtazapine  15 mg Oral QHS  . predniSONE  40 mg Oral Q breakfast   Continuous Infusions: . sodium chloride      Principal Problem:   Acute on chronic respiratory failure with hypoxia (HCC) Active Problems:   COPD (chronic obstructive pulmonary disease) (HCC)   Dementia   Chronic respiratory failure with hypoxia (HCC)   Acute on chronic respiratory failure with hypercapnia (HCC)   COPD with acute exacerbation (HCC)   AKI (acute kidney injury) (HCC)   Thrombocytopenia (HCC)   DM type 2 (diabetes mellitus, type 2) (HCC)   Hematuria   LOS: 5 days

## 2018-02-01 NOTE — Progress Notes (Signed)
Physical Therapy Treatment Patient Details Name: Samantha Zamora MRN: [id number] DOB: August 28, 1924 Today's Date: 02/01/2018    History of Present Illness Raul Winterhalter is a 84 y.o.femalewith past medical history significant for allergic rhinitis, COPD, dementia, diabetes, reflux, hypertension and chronic respiratory failure on home oxygen. She present from nursing home with a chief complaint of shortness of breath. Patient's symptoms started acutely today. Per granddaughter at bedside, she was less responsive at nursing facility. In the ED, patient was found to be hypoxic and was placed on BiPAP. CXR was unremarkable for consolidation. ABG revealed respiratory acidosis. She was admitted for acute hypoxemic respiratory failure with acute COPD exacerbation.     PT Comments    Pt was seen for continued therapy but due to confusion nursing asked PT not to get up.  Her ability to follow instructions is limited, requiring continual cues and redirection as she is mainly focused on being cold. Turned up the heat and applied more blankets,  Which did not solve the issue. Nursing aware, and coming to help with her meal.  Follow Up Recommendations  SNF     Equipment Recommendations  None recommended by PT    Recommendations for Other Services       Precautions / Restrictions Precautions Precautions: Fall(telemetry) Restrictions Weight Bearing Restrictions: No    Mobility  Bed Mobility Overal bed mobility: Needs Assistance             General bed mobility comments: increased time to roll or scoot  Transfers                 General transfer comment: withheld as nursing did not want to have pt up in chair  Ambulation/Gait             General Gait Details: unable   Stairs            Wheelchair Mobility    Modified Rankin (Stroke Patients Only)       Balance Overall balance assessment: Needs assistance                                           Cognition Arousal/Alertness: Lethargic;Awake/alert Behavior During Therapy: Impulsive Overall Cognitive Status: History of cognitive impairments - at baseline Area of Impairment: Orientation;Attention;Memory;Following commands;Safety/judgement;Awareness;Problem solving                 Orientation Level: Place;Time;Situation Current Attention Level: Selective Memory: Decreased recall of precautions;Decreased short-term memory Following Commands: Follows one step commands with increased time;Follows one step commands inconsistently Safety/Judgement: Decreased awareness of safety;Decreased awareness of deficits Awareness: Intellectual Problem Solving: Slow processing;Decreased initiation;Difficulty sequencing;Requires verbal cues;Requires tactile cues General Comments: dementia      Exercises General Exercises - Lower Extremity Ankle Circles/Pumps: AAROM;Both;5 reps Heel Slides: AAROM;Both;10 reps Hip ABduction/ADduction: AAROM;Both;20 reps Straight Leg Raises: AAROM;Both;15 reps    General Comments        Pertinent Vitals/Pain Pain Assessment: No/denies pain    Home Living                      Prior Function            PT Goals (current goals can now be found in the care plan section) Acute Rehab PT Goals Patient Stated Goal: none stated Progress towards PT goals: Not progressing toward goals - comment    Frequency    Min  2X/week      PT Plan Current plan remains appropriate    Co-evaluation              AM-PAC PT "6 Clicks" Daily Activity  Outcome Measure  Difficulty turning over in bed (including adjusting bedclothes, sheets and blankets)?: A Lot Difficulty moving from lying on back to sitting on the side of the bed? : A Lot Difficulty sitting down on and standing up from a chair with arms (e.g., wheelchair, bedside commode, etc,.)?: A Lot Help needed moving to and from a bed to chair (including a wheelchair)?: A  Lot Help needed walking in hospital room?: Total Help needed climbing 3-5 steps with a railing? : Total 6 Click Score: 10    End of Session Equipment Utilized During Treatment: Oxygen Activity Tolerance: Patient limited by fatigue Patient left: in bed;with call bell/phone within reach;with bed alarm set(nursing declined to have pt up in chair) Nurse Communication: Mobility status PT Visit Diagnosis: Muscle weakness (generalized) (M62.81);History of falling (Z91.81);Unsteadiness on feet (R26.81)     Time: 1202-1225 PT Time Calculation (min) (ACUTE ONLY): 23 min  Charges:  $Therapeutic Exercise: 8-22 mins $Therapeutic Activity: 8-22 mins                    G Codes:  Functional Assessment Tool Used: AM-PAC 6 Clicks Basic Mobility    Ramond Dial 02/01/2018, 1:36 PM   Mee Hives, PT MS Acute Rehab Dept. Number: [place]

## 2018-02-02 DIAGNOSIS — J9602 Acute respiratory failure with hypercapnia: Secondary | ICD-10-CM | POA: Diagnosis not present

## 2018-02-02 DIAGNOSIS — J441 Chronic obstructive pulmonary disease with (acute) exacerbation: Secondary | ICD-10-CM | POA: Diagnosis not present

## 2018-02-02 DIAGNOSIS — N179 Acute kidney failure, unspecified: Secondary | ICD-10-CM | POA: Diagnosis not present

## 2018-02-02 DIAGNOSIS — J101 Influenza due to other identified influenza virus with other respiratory manifestations: Secondary | ICD-10-CM | POA: Diagnosis not present

## 2018-02-02 DIAGNOSIS — F419 Anxiety disorder, unspecified: Secondary | ICD-10-CM | POA: Diagnosis not present

## 2018-02-02 DIAGNOSIS — J9601 Acute respiratory failure with hypoxia: Secondary | ICD-10-CM | POA: Diagnosis not present

## 2018-02-02 DIAGNOSIS — J9622 Acute and chronic respiratory failure with hypercapnia: Secondary | ICD-10-CM | POA: Diagnosis not present

## 2018-02-02 DIAGNOSIS — F4320 Adjustment disorder, unspecified: Secondary | ICD-10-CM | POA: Diagnosis not present

## 2018-02-02 DIAGNOSIS — M6281 Muscle weakness (generalized): Secondary | ICD-10-CM | POA: Diagnosis not present

## 2018-02-02 DIAGNOSIS — F3289 Other specified depressive episodes: Secondary | ICD-10-CM | POA: Diagnosis not present

## 2018-02-02 DIAGNOSIS — R451 Restlessness and agitation: Secondary | ICD-10-CM | POA: Diagnosis not present

## 2018-02-02 DIAGNOSIS — D649 Anemia, unspecified: Secondary | ICD-10-CM | POA: Diagnosis not present

## 2018-02-02 DIAGNOSIS — I10 Essential (primary) hypertension: Secondary | ICD-10-CM | POA: Diagnosis not present

## 2018-02-02 DIAGNOSIS — R319 Hematuria, unspecified: Secondary | ICD-10-CM | POA: Diagnosis not present

## 2018-02-02 DIAGNOSIS — D696 Thrombocytopenia, unspecified: Secondary | ICD-10-CM | POA: Diagnosis not present

## 2018-02-02 DIAGNOSIS — R2689 Other abnormalities of gait and mobility: Secondary | ICD-10-CM | POA: Diagnosis not present

## 2018-02-02 DIAGNOSIS — J9600 Acute respiratory failure, unspecified whether with hypoxia or hypercapnia: Secondary | ICD-10-CM | POA: Diagnosis not present

## 2018-02-02 DIAGNOSIS — R1311 Dysphagia, oral phase: Secondary | ICD-10-CM | POA: Diagnosis not present

## 2018-02-02 DIAGNOSIS — R4189 Other symptoms and signs involving cognitive functions and awareness: Secondary | ICD-10-CM | POA: Diagnosis not present

## 2018-02-02 DIAGNOSIS — R488 Other symbolic dysfunctions: Secondary | ICD-10-CM | POA: Diagnosis not present

## 2018-02-02 DIAGNOSIS — F39 Unspecified mood [affective] disorder: Secondary | ICD-10-CM | POA: Diagnosis not present

## 2018-02-02 DIAGNOSIS — F0391 Unspecified dementia with behavioral disturbance: Secondary | ICD-10-CM | POA: Diagnosis not present

## 2018-02-02 DIAGNOSIS — E119 Type 2 diabetes mellitus without complications: Secondary | ICD-10-CM | POA: Diagnosis not present

## 2018-02-02 DIAGNOSIS — K219 Gastro-esophageal reflux disease without esophagitis: Secondary | ICD-10-CM | POA: Diagnosis not present

## 2018-02-02 DIAGNOSIS — R443 Hallucinations, unspecified: Secondary | ICD-10-CM | POA: Diagnosis not present

## 2018-02-02 DIAGNOSIS — J9621 Acute and chronic respiratory failure with hypoxia: Secondary | ICD-10-CM | POA: Diagnosis not present

## 2018-02-02 DIAGNOSIS — R4182 Altered mental status, unspecified: Secondary | ICD-10-CM | POA: Diagnosis not present

## 2018-02-02 LAB — CBC
HEMATOCRIT: 33.2 % — AB (ref 36.0–46.0)
Hemoglobin: 10.2 g/dL — ABNORMAL LOW (ref 12.0–15.0)
MCH: 27.9 pg (ref 26.0–34.0)
MCHC: 30.7 g/dL (ref 30.0–36.0)
MCV: 91 fL (ref 78.0–100.0)
Platelets: 124 10*3/uL — ABNORMAL LOW (ref 150–400)
RBC: 3.65 MIL/uL — AB (ref 3.87–5.11)
RDW: 15 % (ref 11.5–15.5)
WBC: 7.8 10*3/uL (ref 4.0–10.5)

## 2018-02-02 LAB — GLUCOSE, CAPILLARY
GLUCOSE-CAPILLARY: 61 mg/dL — AB (ref 65–99)
GLUCOSE-CAPILLARY: 72 mg/dL (ref 65–99)
Glucose-Capillary: 99 mg/dL (ref 65–99)
Glucose-Capillary: 88 mg/dL (ref 65–99)
Glucose-Capillary: 96 mg/dL (ref 65–99)

## 2018-02-02 LAB — BASIC METABOLIC PANEL
Anion gap: 9 (ref 5–15)
BUN: 36 mg/dL — AB (ref 6–20)
CHLORIDE: 104 mmol/L (ref 101–111)
CO2: 29 mmol/L (ref 22–32)
CREATININE: 1.65 mg/dL — AB (ref 0.44–1.00)
Calcium: 8.3 mg/dL — ABNORMAL LOW (ref 8.9–10.3)
GFR calc Af Amer: 30 mL/min — ABNORMAL LOW (ref >60)
GFR calc non Af Amer: 26 mL/min — ABNORMAL LOW (ref >60)
Glucose, Bld: 73 mg/dL (ref 65–99)
POTASSIUM: 4.2 mmol/L (ref 3.5–5.1)
SODIUM: 142 mmol/L (ref 135–145)

## 2018-02-02 MED ORDER — ALBUTEROL SULFATE (2.5 MG/3ML) 0.083% IN NEBU: 2.5000 mg | INHALATION_SOLUTION | RESPIRATORY_TRACT | 12 refills | Status: DC | PRN

## 2018-02-02 MED ORDER — PREDNISONE 10 MG PO TABS: ORAL_TABLET | ORAL | 0 refills | Status: DC

## 2018-02-02 MED ORDER — ACETAMINOPHEN 325 MG PO TABS: 650.0000 mg | ORAL_TABLET | Freq: Four times a day (QID) | ORAL | Status: AC | PRN

## 2018-02-02 NOTE — Clinical Social Work Placement (Signed)
   CLINICAL SOCIAL WORK PLACEMENT  NOTE  Date:  02/02/2018  Patient Details  Name: Samantha Zamora MRN: [id number] Date of Birth: 05-10-1924  Clinical Social Work is seeking post-discharge placement for this patient at the [facility] level of care (*CSW will initial, date and re-position this form in  chart as items are completed):  Yes   Patient/family provided with [place] Work Department's list of facilities offering this level of care within the geographic area requested by the patient (or if unable, by the patient's family).  Yes   Patient/family informed of their freedom to choose among providers that offer the needed level of care, that participate in Medicare, Medicaid or managed care program needed by the patient, have an available bed and are willing to accept the patient.  Yes   Patient/family informed of [facility]'s ownership interest in [facility] and [facility], as well as of the fact that they are under no obligation to receive care at these facilities.  PASRR submitted to EDS on       PASRR number received on       Existing PASRR number confirmed on 02/02/18     FL2 transmitted to all facilities in geographic area requested by pt/family on 01/31/18     FL2 transmitted to all facilities within larger geographic area on       Patient informed that his/her managed care company has contracts with or will negotiate with certain facilities, including the following:        Yes   Patient/family informed of bed offers received.  Patient chooses bed at [facility]     Physician recommends and patient chooses bed at      Patient to be transferred to [facility] on 02/02/18.  Patient to be transferred to facility by ptar     Patient family notified on 02/02/18 of transfer.  Name of family member notified:  son and dtr in law     PHYSICIAN Please sign FL2, Please sign DNR     Additional  Comment:    _______________________________________________ Jorge Ny, LCSW 02/02/2018, 1:46 PM

## 2018-02-02 NOTE — Progress Notes (Signed)
[facility] here to transport pt. Bedside report given

## 2018-02-02 NOTE — Progress Notes (Signed)
Patient will discharge to [facility] Anticipated discharge date:3/8 Family notified:  Pt dtr in Government social research officer by [employer]- scheduled for 2:15pm  CSW signing off.  Jorge Ny, LCSW Clinical Social Worker [phone]

## 2018-02-02 NOTE — Progress Notes (Signed)
Pts CBG @0411  was 61. Pt give 120oz of orange juice. Rechecked @0530 , CBG-99. Will continue to monitor. Clint Bolder, RN 02/02/18 5:34 AM

## 2018-02-02 NOTE — Discharge Summary (Signed)
DISCHARGE SUMMARY  Samantha Zamora  MR#: [id number]  DOB:1924/07/02  Date of Admission: 01/27/2018 Date of Discharge: 02/02/2018  Attending Physician:Jeffrey Hennie Duos, MD   Patient's [name], [name], NP  Consults:  PCCM  Disposition: D/C to SNF   Follow-up Appts: Contact information for after-discharge care    Destination    [facility] .   Service:  Skilled Nursing Contact information: [address] [place] [phone]              Tests Needing Follow-up: - assure pt remains hydrated - assist w/ meals/drinks - recheck creatinine in 3 days  - recheck platelet count in 3 days  - consider UA in 3 days to f/u hematuria   Discharge Diagnoses: Acute on chronic hypoxic resp failure Acute hypercapnic resp failure Acute COPD exacerbation AKI Thrombocytopenia Chronic normocytic anemia DM 2 CBG stable Hematuria Dementia NO CODE BLUE - DNR  Initial presentation: 84yo F w/ a Hx of  COPD on oxygen, dementia, and DM who presented with SOB and was admitted for acute resp failure requiring BiPAP due to an acute COPD exacerbation.  Hospital Course:  Acute on chronic hypoxic resp failure - Acute hypercapnic resp failure - Acute COPD exacerbation BiPAP required on admission - CXR negative - Resp panel negative - treated with azithromycin, steroids, nebs - PCCM suggested availability of NIV/BIPAP at facility - this has been arranged - stable at time of d/c w/ calm respirations and no wheezing on exam   AKI crt steadily improving during hospital stay - assure pt remains hydrated - assist w/ meals/drinks - recheck crt in 3 days   Thrombocytopenia Likely related to acute illness - plt remained above 100 - f/u in 3 days   Chronic normocytic anemia Hgb stable at time of d/c w/ no evidence of acute blood loss  DM 2 CBG stable   Hematuria Noted on UA - may require outpt w/u - recheck UA in 3 days at facility  - most likely simply due to B nephrolithiasis noted on renal US - no acute complications on renal US   Dementia continue Remeron, Haldol, Ativan, trazodone   Allergies as of 02/02/2018      Reactions   Ibuprofen Other (See Comments)   bleeding      Medication List    STOP taking these medications   oseltamivir 75 MG capsule Commonly known as:  TAMIFLU     TAKE these medications   acetaminophen 325 MG tablet Commonly known as:  TYLENOL Take 2 tablets (650 mg total) by mouth every 6 (six) hours as needed for mild pain (or Fever >/= 101).   aspirin EC 81 MG tablet Take 81 mg by mouth daily.   haloperidol 2 MG/ML solution Commonly known as:  HALDOL Take 1 mg by mouth every 6 (six) hours as needed (extreme agitation).   ipratropium-albuterol 0.5-2.5 (3) MG/3ML Soln Commonly known as:  DUONEB Take 3 mLs by nebulization every 6 (six) hours as needed. What changed:  reasons to take this   LORazepam in dextrose solution Take 1 mg by mouth See admin instructions. Apply 1 ml (1 mg) topically to skin every 6 hours as needed for agitation/anxiety   mirtazapine 15 MG tablet Commonly known as:  REMERON Take 15 mg by mouth at bedtime.   NUTRITIONAL SUPPLEMENT PO Take 120 mLs by mouth 2 (two) times daily between meals.   ondansetron 4 MG disintegrating tablet Commonly known as:  ZOFRAN ODT  Take 1 tablet (4 mg total) by mouth every 8 (eight) hours as needed for nausea or vomiting.   ondansetron 4 MG tablet Commonly known as:  ZOFRAN Take 4 mg by mouth every 8 (eight) hours as needed for nausea or vomiting.   OXYGEN Inhale 3 L into the lungs continuous.   predniSONE 10 MG tablet Commonly known as:  DELTASONE Take 4 tablets a day on 3/9 and 3/10, then take 3 tablets a day on 3/11 and 3/12, then take 2 tablets a day on 3/13 and 3/14, then take 1 tablet a day on 3/15 and 3/16, then stop What changed:  additional instructions   traZODone 50 MG tablet Commonly known as:   DESYREL Take 50 mg by mouth at bedtime as needed (insomnia).            Durable Medical Equipment  (From admission, onward)        Start     Ordered   01/30/18 1119  For home use only DME Bipap  Once    Question Answer Comment  Bleed in oxygen (LPM) 3   Keep 02 saturation 88-92%   Inspiratory pressure 10   Expiratory pressure 5      01/30/18 1119      Day of Discharge BP (!) 181/63 (BP Location: Left Arm)   Pulse 89   Temp 98.1 F (36.7 C) (Oral)   Resp 15   Ht 5\' 3"  (1.6 m)   Wt 48.4 kg (106 lb 11.2 oz)   SpO2 98%   BMI 18.90 kg/m   Physical Exam: General: No acute respiratory distress Lungs: Clear to auscultation bilaterally without wheezes or crackles Cardiovascular: Regular rate and rhythm without murmur gallop or rub normal S1 and S2 Abdomen: Nontender, nondistended, soft, bowel sounds positive, no rebound, no ascites, no appreciable mass Extremities: No significant cyanosis, clubbing, or edema bilateral lower extremities  Basic Metabolic Panel: Recent Labs  Lab 01/27/18 1556  01/29/18 0250 01/30/18 0442 01/31/18 0336 02/01/18 0516 02/02/18 0335  NA  --    < > 140 141 141 142 142  K  --    < > 4.9 5.4* 4.5 4.4 4.2  CL  --    < > 103 104 104 103 104  CO2  --    < > 30 29 27  32 29  GLUCOSE  --    < > 139* 107* 94 97 73  BUN  --    < > 44* 42* 35* 32* 36*  CREATININE  --    < > 2.14* 1.98* 1.86* 1.77* 1.65*  CALCIUM  --    < > 8.8* 9.1 8.7* 8.5* 8.3*  MG 2.3  --   --   --   --   --   --   PHOS 4.1  --   --   --   --   --   --    < > = values in this interval not displayed.   CBC: Recent Labs  Lab 01/27/18 1152  01/29/18 0250 01/30/18 0442 01/31/18 0336 02/01/18 0516 02/02/18 0335  WBC 11.2*   < > 15.4* 13.5* 10.8* 8.6 7.8  NEUTROABS 8.5*  --   --   --   --   --   --   HGB 11.1*   < > 9.4* 10.8* 10.5* 10.2* 10.2*  HCT 37.1   < > 30.1* 34.8* 33.4* 33.2* 33.2*  MCV 93.7   < > 89.9 89.7 89.1 90.5 91.0  PLT 163   < > 198 216 158 142* 124*    < > = values in this interval not displayed.    CBG: Recent Labs  Lab 02/01/18 2027 02/02/18 0018 02/02/18 0411 02/02/18 0530 02/02/18 1120  GLUCAP 123* 72 61* 99 96    Recent Results (from the past 240 hour(s))  MRSA PCR Screening     Status: None   Collection Time: 01/27/18  5:44 PM  Result Value Ref Range Status   MRSA by PCR NEGATIVE NEGATIVE Final    Comment:        The GeneXpert MRSA Assay (FDA approved for NASAL specimens only), is one component of a comprehensive MRSA colonization surveillance program. It is not intended to diagnose MRSA infection nor to guide or monitor treatment for MRSA infections. Performed at [facility], [place] [address] [place], [place] [address]   Respiratory Panel by PCR     Status: None   Collection Time: 01/27/18 11:37 PM  Result Value Ref Range Status   Adenovirus NOT DETECTED NOT DETECTED Final   Coronavirus 229E NOT DETECTED NOT DETECTED Final   Coronavirus HKU1 NOT DETECTED NOT DETECTED Final   Coronavirus NL63 NOT DETECTED NOT DETECTED Final   Coronavirus OC43 NOT DETECTED NOT DETECTED Final   Metapneumovirus NOT DETECTED NOT DETECTED Final   Rhinovirus / Enterovirus NOT DETECTED NOT DETECTED Final   Influenza A NOT DETECTED NOT DETECTED Final   Influenza B NOT DETECTED NOT DETECTED Final   Parainfluenza Virus 1 NOT DETECTED NOT DETECTED Final   Parainfluenza Virus 2 NOT DETECTED NOT DETECTED Final   Parainfluenza Virus 3 NOT DETECTED NOT DETECTED Final   Parainfluenza Virus 4 NOT DETECTED NOT DETECTED Final   Respiratory Syncytial Virus NOT DETECTED NOT DETECTED Final   Bordetella pertussis NOT DETECTED NOT DETECTED Final   Chlamydophila pneumoniae NOT DETECTED NOT DETECTED Final   Mycoplasma pneumoniae NOT DETECTED NOT DETECTED Final    Comment: Performed at [facility], [place] [address] [place], [place] [address]     Time spent in discharge (includes decision making & examination of pt): 35  minutes  02/02/2018, 12:49 PM   Cherene Altes, MD [facility] Office  [phone] Pager [phone]  On-Call/Text Page:      [email or web address]      password [facility]

## 2018-02-02 NOTE — Progress Notes (Signed)
Received call from pts son, wishing to speak w/ attending MD. Messaged Dr. Thereasa Solo to relay

## 2018-02-02 NOTE — Progress Notes (Signed)
Report given to [place], [place]. [facility] to transport pt. Pts son and dtr in law to meet at facility

## 2018-02-02 NOTE — Consult Note (Signed)
   [facility] CM Inpatient Consult   02/02/2018  Nafeesa Dils 12/11/1923 [id number]   Patient screened for [facility] program services due to multiple hospitalizations.   Chart reviewed. Noted discharge plan is for SNF when stable.    Marthenia Rolling, MSN-Ed, RN,BSN [facility] Liaison [phone]

## 2018-02-05 ENCOUNTER — Encounter: Payer: Self-pay | Admitting: Internal Medicine

## 2018-02-05 ENCOUNTER — Non-Acute Institutional Stay (SKILLED_NURSING_FACILITY): Payer: Medicare Other | Admitting: Internal Medicine

## 2018-02-05 DIAGNOSIS — J441 Chronic obstructive pulmonary disease with (acute) exacerbation: Secondary | ICD-10-CM

## 2018-02-05 DIAGNOSIS — N179 Acute kidney failure, unspecified: Secondary | ICD-10-CM

## 2018-02-05 DIAGNOSIS — F3289 Other specified depressive episodes: Secondary | ICD-10-CM | POA: Diagnosis not present

## 2018-02-05 DIAGNOSIS — F0391 Unspecified dementia with behavioral disturbance: Secondary | ICD-10-CM | POA: Diagnosis not present

## 2018-02-05 DIAGNOSIS — E119 Type 2 diabetes mellitus without complications: Secondary | ICD-10-CM

## 2018-02-05 DIAGNOSIS — D696 Thrombocytopenia, unspecified: Secondary | ICD-10-CM

## 2018-02-05 DIAGNOSIS — D649 Anemia, unspecified: Secondary | ICD-10-CM | POA: Diagnosis not present

## 2018-02-05 DIAGNOSIS — R319 Hematuria, unspecified: Secondary | ICD-10-CM | POA: Diagnosis not present

## 2018-02-05 DIAGNOSIS — J9621 Acute and chronic respiratory failure with hypoxia: Secondary | ICD-10-CM | POA: Diagnosis not present

## 2018-02-05 DIAGNOSIS — F39 Unspecified mood [affective] disorder: Secondary | ICD-10-CM | POA: Diagnosis not present

## 2018-02-05 DIAGNOSIS — J9622 Acute and chronic respiratory failure with hypercapnia: Secondary | ICD-10-CM

## 2018-02-05 NOTE — Progress Notes (Signed)
: Provider:  Noah Delaine. Sheppard Coil, MD Location:  [place] Room Number: [phone] Place of Service:  SNF ([phone])  PCP: Nche, [name], NP Patient Care Team: Nche, [name], NP as PCP - General (Internal Medicine)  Extended Emergency Contact Information Primary Emergency Contact: [place] Mobile Phone: [phone] Relation: Son Secondary Emergency Contact: Amelia [place] Mobile Phone: [phone] Relation: Other     Allergies: Ibuprofen  Chief Complaint  Patient presents with  . New Admit To SNF    Admit to Facility    HPI: Patient is 84 y.o. female with allergic rhinitis, COPD, dementia, diabetes, reflux, hypertension, and chronic respiratory failure on home oxygen, who presented to [facility] ED with a complaint of shortness of breath that had started acutely that morning. No cough or fever, no known sick contacts. In ED patient was found to be hypoxic and was placed on BiPAP Chest x-ray did not show pneumonia or any other acute disease.ABG showed acidosis. Patient was admitted to [facility] from 3/2-8 for acute COPD exacerbation. Acute hypoxic and hypercapnic respiratory failure. Hospital course was exacerbated by acute kidney injurywhich improved with IV hydration and by thrombocytopenia. Patient is admitted to skilled nursing facility for OT/PT. While at skilled nursing facility patient will be followed for diabetes mellitus type 2 treated with diet alone, dementia treated with supportive care and and depression treated with Remeron.   Past Medical History:  Diagnosis Date  . Allergic rhinitis   . COPD (chronic obstructive pulmonary disease) ([place])    O2 dependent  . Dementia    early stage  . DM (diabetes mellitus) ([place])   . Eczema   . GERD (gastroesophageal reflux disease)   . HTN (hypertension), benign   . Hx of colon cancer, stage I   . Osteoporosis     Past  Surgical History:  Procedure Laterality Date  . ABDOMINAL HYSTERECTOMY  1975  . COLON SURGERY  01/2010  . UMBILICAL HERNIA REPAIR  01/2010    Allergies as of 02/05/2018      Reactions   Ibuprofen Other (See Comments)   bleeding      Medication List        Accurate as of 02/05/18 11:32 AM. Always use your most recent med list.          acetaminophen 325 MG tablet Commonly known as:  TYLENOL Take 2 tablets (650 mg total) by mouth every 6 (six) hours as needed for mild pain (or Fever >/= 101).   aspirin EC 81 MG tablet Take 81 mg by mouth daily.   haloperidol 2 MG/ML solution Commonly known as:  HALDOL Take 1 mg by mouth every 6 (six) hours as needed (extreme agitation).   ipratropium-albuterol 0.5-2.5 (3) MG/3ML Soln Commonly known as:  DUONEB Take 3 mLs by nebulization every 6 (six) hours as needed.   LORazepam in dextrose solution Take 1 mg by mouth See admin instructions. Apply 1 ml (1 mg) topically to skin every 6 hours as needed for agitation/anxiety   mirtazapine 15 MG tablet Commonly known as:  REMERON Take 15 mg by mouth at bedtime.   NUTRITIONAL SUPPLEMENT PO Take 120 mLs by mouth 2 (two) times daily between meals.   ondansetron 4 MG disintegrating tablet Commonly known as:  ZOFRAN ODT Take 1 tablet (4 mg total) by mouth every 8 (eight) hours as needed for nausea or vomiting.   ondansetron 4 MG tablet  Commonly known as:  ZOFRAN Take 4 mg by mouth every 8 (eight) hours as needed for nausea or vomiting.   OXYGEN Inhale 3 L into the lungs continuous.   predniSONE 10 MG tablet Commonly known as:  DELTASONE Take 4 tablets a day on 3/9 and 3/10, then take 3 tablets a day on 3/11 and 3/12, then take 2 tablets a day on 3/13 and 3/14, then take 1 tablet a day on 3/15 and 3/16, then stop   traZODone 50 MG tablet Commonly known as:  DESYREL Take 50 mg by mouth at bedtime as needed (insomnia).       No orders of the defined types were placed in this  encounter.   Immunization History  Administered Date(s) Administered  . Pneumococcal-Unspecified 05/20/2010  . Td 10/11/2013  . Zoster 05/20/2010    Social History   Tobacco Use  . Smoking status: Former Smoker    Packs/day: 2.00    Years: 30.00    Pack years: 60.00  . Smokeless tobacco: Never Used  . Tobacco comment: quit smoking over 50 years ago  Substance Use Topics  . Alcohol use: No    Family history is   Family History  Problem Relation Age of Onset  . Aneurysm Mother       Review of Systems  DATA OBTAINED: from patient, nurse GENERAL:  no fevers, fatigue, appetite changes SKIN: No itching, or rash EYES: No eye pain, redness, discharge EARS: No earache, tinnitus, change in hearing NOSE: No congestion, drainage or bleeding  MOUTH/THROAT: No mouth or tooth pain, No sore throat RESPIRATORY: No cough, wheezing, SOB CARDIAC: No chest pain, palpitations, lower extremity edema  GI: No abdominal pain, No N/V/D or constipation, No heartburn or reflux  GU: No dysuria, frequency or urgency, or incontinence  MUSCULOSKELETAL: No unrelieved bone/joint pain NEUROLOGIC: No headache, dizziness or focal weakness PSYCHIATRIC: No c/o anxiety or sadness   Vitals:   02/05/18 1118  BP: 132/62  Pulse: 85  Resp: 20  Temp: 98 F (36.7 C)  SpO2: 97%    SpO2 Readings from Last 1 Encounters:  02/05/18 97%   Body mass index is 18.9 kg/m.     Physical Exam  GENERAL APPEARANCE: Alert,   No acute distress.  SKIN: No diaphoresis rash HEAD: Normocephalic, atraumatic  EYES: Conjunctiva/lids clear. Pupils round, reactive. EOMs intact.  EARS: External exam WNL, canals clear. Hearing grossly normal.  NOSE: No deformity or discharge.  MOUTH/THROAT: Lips w/o lesions  RESPIRATORY: Breathing is even, unlabored. Lung sounds are clear   CARDIOVASCULAR: Heart RRR no murmurs, rubs or gallops. No peripheral edema.   GASTROINTESTINAL: Abdomen is soft, non-tender, not distended w/  normal bowel sounds. GENITOURINARY: Bladder non tender, not distended  MUSCULOSKELETAL: No abnormal joints or musculature NEUROLOGIC:  Cranial nerves 2-12 grossly intact. Moves all extremities  PSYCHIATRIC: Mood and affect with dementia no behavioral issues  Patient Active Problem List   Diagnosis Date Noted  . Chronic respiratory failure with hypoxia ([place]) 02/01/2018  . AKI (acute kidney injury) ([place]) 02/01/2018  . Thrombocytopenia ([place]) 02/01/2018  . DM type 2 (diabetes mellitus, type 2) ([place]) 02/01/2018  . Hematuria 02/01/2018  . Hypoxia   . Dementia 03/31/2017  . Anxiety 03/31/2017  . Depression 03/31/2017  . Chronic rhinitis 03/24/2017  . 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 COPD (chronic obstructive pulmonary disease) ([place]) 03/20/2017      Labs reviewed: Basic Metabolic Panel:    Component Value Date/Time   NA 142 02/02/2018 0335  K 4.2 02/02/2018 0335   CL 104 02/02/2018 0335   CO2 29 02/02/2018 0335   GLUCOSE 73 02/02/2018 0335   BUN 36 (H) 02/02/2018 0335   CREATININE 1.65 (H) 02/02/2018 0335   CALCIUM 8.3 (L) 02/02/2018 0335   PROT 6.4 (L) 01/14/2018 0756   ALBUMIN 3.4 (L) 01/14/2018 0756   AST 41 01/14/2018 0756   ALT 20 01/14/2018 0756   ALKPHOS 68 01/14/2018 0756   BILITOT 0.8 01/14/2018 0756   GFRNONAA 26 (L) 02/02/2018 0335   GFRAA 30 (L) 02/02/2018 0335    Recent Labs    01/27/18 1556  01/31/18 0336 02/01/18 0516 02/02/18 0335  NA  --    < > 141 142 142  K  --    < > 4.5 4.4 4.2  CL  --    < > 104 103 104  CO2  --    < > 27 32 29  GLUCOSE  --    < > 94 97 73  BUN  --    < > 35* 32* 36*  CREATININE  --    < > 1.86* 1.77* 1.65*  CALCIUM  --    < > 8.7* 8.5* 8.3*  MG 2.3  --   --   --   --   PHOS 4.1  --   --   --   --    < > = values in this interval not displayed.   Liver Function Tests: Recent Labs    03/20/17 1606 09/06/17 1409 01/14/18 0756  AST 19 22 41  ALT 12 13* 20  ALKPHOS 57 64 68  BILITOT 0.7 0.6 0.8  PROT 6.5 6.4* 6.4*    ALBUMIN 3.9 3.5 3.4*   No results for input(s): LIPASE, AMYLASE in the last 8760 hours. No results for input(s): AMMONIA in the last 8760 hours. CBC: Recent Labs    01/16/18 0213 01/17/18 0702 01/27/18 1152  01/31/18 0336 02/01/18 0516 02/02/18 0335  WBC 5.2 4.6 11.2*   < > 10.8* 8.6 7.8  NEUTROABS 4.4 3.1 8.5*  --   --   --   --   HGB 8.9* 9.4* 11.1*   < > 10.5* 10.2* 10.2*  HCT 30.6* 31.9* 37.1   < > 33.4* 33.2* 33.2*  MCV 93.6 93.0 93.7   < > 89.1 90.5 91.0  PLT 124* 143* 163   < > 158 142* 124*   < > = values in this interval not displayed.   Lipid No results for input(s): CHOL, HDL, LDLCALC, TRIG in the last 8760 hours.  Cardiac Enzymes: No results for input(s): CKTOTAL, CKMB, CKMBINDEX, TROPONINI in the last 8760 hours. BNP: No results for input(s): BNP in the last 8760 hours. No results found for: MICROALBUR No results found for: HGBA1C Lab Results  Component Value Date   TSH 0.84 03/20/2017   No results found for: VITAMINB12 No results found for: FOLATE No results found for: IRON, TIBC, FERRITIN  Imaging and Procedures obtained prior to SNF admission: Dg Chest 2 View  Result Date: 01/27/2018 CLINICAL DATA:  Cough x1 week EXAM: CHEST  2 VIEW COMPARISON:  01/14/2018 FINDINGS: Prior right lower lobe opacity has improved. Mild left basilar opacity, likely scarring/atelectasis. Chronic right midlung/perihilar scarring. No pleural effusion or pneumothorax. The heart is normal in size. Mild degenerative changes of the visualized thoracolumbar spine. IMPRESSION: No evidence of acute cardiopulmonary disease. Electronically Signed   By: Julian Hy M.D.   On: 01/27/2018 11:32  Dg Shoulder Right  Result Date: 01/27/2018 CLINICAL DATA:  Status post fall, shoulder pain EXAM: RIGHT SHOULDER - 2+ VIEW COMPARISON:  None. FINDINGS: Generalized osteopenia. No acute fracture or dislocation. Mild osteoarthritis of the glenohumeral joint. Mild arthropathy of the  acromioclavicular joint. IMPRESSION: No acute osseous injury of the right shoulder. Electronically Signed   By: Kathreen Devoid   On: 01/27/2018 15:15   Ct Head Wo Contrast  Result Date: 01/27/2018 CLINICAL DATA:  84 year old female with lethargy. Dementia. Cough for 1 week. EXAM: CT HEAD WITHOUT CONTRAST TECHNIQUE: Contiguous axial images were obtained from the base of the skull through the vertex without intravenous contrast. COMPARISON:  Head CT 09/06/2017. FINDINGS: Brain: Mild cerebral atrophy. Patchy and confluent areas of decreased attenuation are noted throughout the deep and periventricular white matter of the cerebral hemispheres bilaterally, compatible with chronic microvascular ischemic disease. No evidence of acute infarction, hemorrhage, hydrocephalus, extra-axial collection or mass lesion/mass effect. Vascular: No hyperdense vessel or unexpected calcification. Skull: Normal. Negative for fracture or focal lesion. Sinuses/Orbits: No acute finding. Other: None. IMPRESSION: 1. No acute intracranial abnormalities. 2. Mild cerebral atrophy with extensive chronic microvascular ischemic changes in the cerebral white matter, as above, similar to the prior examination. Electronically Signed   By: Vinnie Langton M.D.   On: 01/27/2018 14:56   Dg Chest Port 1 View  Result Date: 01/28/2018 CLINICAL DATA:  Acute respiratory failure EXAM: PORTABLE CHEST 1 VIEW COMPARISON:  01/27/2018 FINDINGS: Lungs are clear.  No pleural effusion or pneumothorax. The heart is normal in size. IMPRESSION: No evidence of acute cardiopulmonary disease. Electronically Signed   By: Julian Hy M.D.   On: 01/28/2018 07:51     Not all labs, radiology exams or other studies done during hospitalization come through on my EPIC note; however they are reviewed by me.    Assessment and Plan  Acute on chronic respiratory failure with hypoxia and hypercapnia/acute COPD exacerbationequiring BiPAP on admission; chest x-ray  negative; respiratory panel negative; treated withZ-Pak, steroids and nebs; shoulder BiPAP AT FACILITY SNF - dmitted for OT/PT; continue DuoNeb when necessary, O2 3 L nasal cannula continuous, and prednisone taper and an 8 day steroid taper  Acute kidney injury-improved with hydration SNF - dC creatinine 1.65; follow-up BMP  Thrombocytopenia/chronic normocytic anemia SNF - DC hemoglobin 10.2, DC platelets 124; follow-up CBC  Diabetes mellitus type 2-CBGs reported as stable SNF - ontrolled on diet alone  Hematuria-noted on UA showed bilateral nephrolithiasis noted on renal ultrasound, no other complication SNF - repeat UA  Dementia SNF _continue supportive care  Depression SNF - not stated as in stable continue Remeron 15 mg by mouth daily at bedtime  Mood disorders SNF - patient seemed to [facility] with when necessary Haldol and when necessary lorazepam; due to state law disease cannot be given when necessary we can give them as individual orders   Time spent greater than 45 minutes;> 50% of time with patient was spent reviewing records, labs, tests and studies, counseling and developing plan of care  Webb Silversmith D. Sheppard Coil, MD

## 2018-02-06 ENCOUNTER — Non-Acute Institutional Stay (SKILLED_NURSING_FACILITY): Payer: Medicare Other | Admitting: Internal Medicine

## 2018-02-06 ENCOUNTER — Encounter: Payer: Self-pay | Admitting: Internal Medicine

## 2018-02-06 DIAGNOSIS — F419 Anxiety disorder, unspecified: Secondary | ICD-10-CM | POA: Diagnosis not present

## 2018-02-06 DIAGNOSIS — R451 Restlessness and agitation: Secondary | ICD-10-CM | POA: Diagnosis not present

## 2018-02-06 LAB — BASIC METABOLIC PANEL
BUN: 21 (ref 4–21)
Calcium: 8.6
Creat: 1.23
GLUCOSE: 83
Potassium: 3.8
Sodium: 146

## 2018-02-06 NOTE — Progress Notes (Signed)
Location:  [facility] Room Number: [phone] Place of Service:  SNF [phone])  Noah Delaine. Sheppard Coil, MD  Nche, [name], NP  Patient Care Team: Nche, [name], NP as PCP - General (Internal Medicine)  Extended Emergency Contact Information Primary Emergency Contact: [place] of [place] Mobile Phone: [phone] Relation: Son Secondary Emergency Contact: Amelia Jo [place] of [place] Mobile Phone: [phone] Relation: Other    Allergies: Ibuprofen  Chief Complaint  Patient presents with  . Acute Visit    Discuss medications    HPI: Patient is 84 y.o. female who s being seen acutely for agitation.patient is getting very anxious at night and calling out, it is reported that she does not feel afraid.er family patient cannot use Ativan, and she has an idiosyncratic reaction to it.  Past Medical History:  Diagnosis Date  . Allergic rhinitis   . COPD (chronic obstructive pulmonary disease) ([place])    O2 dependent  . Dementia    early stage  . DM (diabetes mellitus) ([place])   . Eczema   . GERD (gastroesophageal reflux disease)   . HTN (hypertension), benign   . Hx of colon cancer, stage I   . Osteoporosis     Past Surgical History:  Procedure Laterality Date  . ABDOMINAL HYSTERECTOMY  1975  . COLON SURGERY  01/2010  . UMBILICAL HERNIA REPAIR  01/2010    Allergies as of 02/06/2018      Reactions   Ibuprofen Other (See Comments)   bleeding      Medication List        Accurate as of 02/06/18  1:44 PM. Always use your most recent med list.          acetaminophen 325 MG tablet Commonly known as:  TYLENOL Take 2 tablets (650 mg total) by mouth every 6 (six) hours as needed for mild pain (or Fever >/= 101).   aspirin EC 81 MG tablet Take 81 mg by mouth daily.   ENSURE Take 237 mLs by mouth 2 (two) times daily between meals.   ipratropium-albuterol 0.5-2.5 (3) MG/3ML Soln Commonly known as:   DUONEB Take 3 mLs by nebulization every 6 (six) hours as needed.   mirtazapine 15 MG tablet Commonly known as:  REMERON Take 15 mg by mouth at bedtime.   ondansetron 4 MG tablet Commonly known as:  ZOFRAN Take 4 mg by mouth every 8 (eight) hours as needed for nausea or vomiting.   traZODone 50 MG tablet Commonly known as:  DESYREL Take 50 mg by mouth at bedtime as needed (insomnia).       No orders of the defined types were placed in this encounter.   Immunization History  Administered Date(s) Administered  . Pneumococcal-Unspecified 05/20/2010  . Td 10/11/2013  . Zoster 05/20/2010    Social History   Tobacco Use  . Smoking status: Former Smoker    Packs/day: 2.00    Years: 30.00    Pack years: 60.00  . Smokeless tobacco: Never Used  . Tobacco comment: quit smoking over 50 years ago  Substance Use Topics  . Alcohol use: No    Review of Systems  DATA OBTAINED: from patient-very very limited; nursing-as per history of present illness GENERAL:  no fevers, fatigue, appetite changes SKIN: No itching, rash HEENT: No complaint RESPIRATORY: No cough, wheezing, SOB CARDIAC: No chest pain, palpitations, lower extremity edema  GI: No abdominal pain, No N/V/D or constipation, No heartburn or reflux  GU: No dysuria, frequency or urgency, or incontinence  MUSCULOSKELETAL: No unrelieved bone/joint pain NEUROLOGIC: No headache, dizziness  PSYCHIATRIC: nxiety and sundowning  Vitals:   02/06/18 1333  BP: (!) 153/85  Pulse: 88  Resp: 20  Temp: 98.7 F (37.1 C)  SpO2: 94%   Body mass index is 18.92 kg/m. Physical Exam  GENERAL APPEARANCE: Alert, conversant, No acute distress  SKIN: No diaphoresis rash HEENT: Unremarkable RESPIRATORY: Breathing is even, unlabored. Lung sounds are clear   CARDIOVASCULAR: Heart RRR no murmurs, rubs or gallops. No peripheral edema  GASTROINTESTINAL: Abdomen is soft, non-tender, not distended w/ normal bowel sounds.  GENITOURINARY:  Bladder non tender, not distended  MUSCULOSKELETAL: No abnormal joints or musculature NEUROLOGIC: Cranial nerves 2-12 grossly intact. Moves all extremities PSYCHIATRIC: Mood and affect with dementia, mild anxiety at the moment  Patient Active Problem List   Diagnosis Date Noted  . Chronic respiratory failure with hypoxia ([place]) 02/01/2018  . AKI (acute kidney injury) ([place]) 02/01/2018  . Thrombocytopenia ([place]) 02/01/2018  . DM type 2 (diabetes mellitus, type 2) ([place]) 02/01/2018  . Hematuria 02/01/2018  . Hypoxia   . Dementia 03/31/2017  . Anxiety 03/31/2017  . Depression 03/31/2017  . Chronic rhinitis 03/24/2017  . 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 COPD (chronic obstructive pulmonary disease) ([place]) 03/20/2017    CMP     Component Value Date/Time   NA 142 02/02/2018 0335   K 4.2 02/02/2018 0335   CL 104 02/02/2018 0335   CO2 29 02/02/2018 0335   GLUCOSE 73 02/02/2018 0335   BUN 36 (H) 02/02/2018 0335   CREATININE 1.65 (H) 02/02/2018 0335   CALCIUM 8.3 (L) 02/02/2018 0335   PROT 6.4 (L) 01/14/2018 0756   ALBUMIN 3.4 (L) 01/14/2018 0756   AST 41 01/14/2018 0756   ALT 20 01/14/2018 0756   ALKPHOS 68 01/14/2018 0756   BILITOT 0.8 01/14/2018 0756   GFRNONAA 26 (L) 02/02/2018 0335   GFRAA 30 (L) 02/02/2018 0335   Recent Labs    01/27/18 1556  01/31/18 0336 02/01/18 0516 02/02/18 0335  NA  --    < > 141 142 142  K  --    < > 4.5 4.4 4.2  CL  --    < > 104 103 104  CO2  --    < > 27 32 29  GLUCOSE  --    < > 94 97 73  BUN  --    < > 35* 32* 36*  CREATININE  --    < > 1.86* 1.77* 1.65*  CALCIUM  --    < > 8.7* 8.5* 8.3*  MG 2.3  --   --   --   --   PHOS 4.1  --   --   --   --    < > = values in this interval not displayed.   Recent Labs    03/20/17 1606 09/06/17 1409 01/14/18 0756  AST 19 22 41  ALT 12 13* 20  ALKPHOS 57 64 68  BILITOT 0.7 0.6 0.8  PROT 6.5 6.4* 6.4*  ALBUMIN 3.9 3.5 3.4*   Recent Labs    01/16/18 0213 01/17/18 0702 01/27/18 1152   01/31/18 0336 02/01/18 0516 02/02/18 0335  WBC 5.2 4.6 11.2*   < > 10.8* 8.6 7.8  NEUTROABS 4.4 3.1 8.5*  --   --   --   --   HGB 8.9* 9.4* 11.1*   < > 10.5* 10.2* 10.2*  HCT 30.6* 31.9*  37.1   < > 33.4* 33.2* 33.2*  MCV 93.6 93.0 93.7   < > 89.1 90.5 91.0  PLT 124* 143* 163   < > 158 142* 124*   < > = values in this interval not displayed.   No results for input(s): CHOL, LDLCALC, TRIG in the last 8760 hours.  Invalid input(s): HCL No results found for: MICROALBUR Lab Results  Component Value Date   TSH 0.84 03/20/2017   No results found for: HGBA1C No results found for: CHOL, HDL, LDLCALC, LDLDIRECT, TRIG, CHOLHDL  Significant Diagnostic Results in last 30 days:  Dg Chest 2 View  Result Date: 01/27/2018 CLINICAL DATA:  Cough x1 week EXAM: CHEST  2 VIEW COMPARISON:  01/14/2018 FINDINGS: Prior right lower lobe opacity has improved. Mild left basilar opacity, likely scarring/atelectasis. Chronic right midlung/perihilar scarring. No pleural effusion or pneumothorax. The heart is normal in size. Mild degenerative changes of the visualized thoracolumbar spine. IMPRESSION: No evidence of acute cardiopulmonary disease. Electronically Signed   By: Julian Hy M.D.   On: 01/27/2018 11:32   Dg Chest 2 View  Result Date: 01/13/2018 CLINICAL DATA:  Shortness of breath, wheezing and congestion. EXAM: CHEST  2 VIEW COMPARISON:  09/13/2017 FINDINGS: The heart size and mediastinal contours are within normal limits. Aortic atherosclerosis. Both lungs are clear. Healing right posterior rib deformities are identified. IMPRESSION: 1. No acute cardiopulmonary abnormalities. 2.  Aortic Atherosclerosis (ICD10-I70.0). 3. Healing right posterior rib fractures. Electronically Signed   By: Kerby Moors M.D.   On: 01/13/2018 14:34   Dg Shoulder Right  Result Date: 01/27/2018 CLINICAL DATA:  Status post fall, shoulder pain EXAM: RIGHT SHOULDER - 2+ VIEW COMPARISON:  None. FINDINGS: Generalized  osteopenia. No acute fracture or dislocation. Mild osteoarthritis of the glenohumeral joint. Mild arthropathy of the acromioclavicular joint. IMPRESSION: No acute osseous injury of the right shoulder. Electronically Signed   By: Kathreen Devoid   On: 01/27/2018 15:15   Ct Head Wo Contrast  Result Date: 01/27/2018 CLINICAL DATA:  84 year old female with lethargy. Dementia. Cough for 1 week. EXAM: CT HEAD WITHOUT CONTRAST TECHNIQUE: Contiguous axial images were obtained from the base of the skull through the vertex without intravenous contrast. COMPARISON:  Head CT 09/06/2017. FINDINGS: Brain: Mild cerebral atrophy. Patchy and confluent areas of decreased attenuation are noted throughout the deep and periventricular white matter of the cerebral hemispheres bilaterally, compatible with chronic microvascular ischemic disease. No evidence of acute infarction, hemorrhage, hydrocephalus, extra-axial collection or mass lesion/mass effect. Vascular: No hyperdense vessel or unexpected calcification. Skull: Normal. Negative for fracture or focal lesion. Sinuses/Orbits: No acute finding. Other: None. IMPRESSION: 1. No acute intracranial abnormalities. 2. Mild cerebral atrophy with extensive chronic microvascular ischemic changes in the cerebral white matter, as above, similar to the prior examination. Electronically Signed   By: Vinnie Langton M.D.   On: 01/27/2018 14:56   US Renal  Result Date: 01/29/2018 CLINICAL DATA:  Acute kidney injury. EXAM: RENAL / URINARY TRACT ULTRASOUND COMPLETE COMPARISON:  None. FINDINGS: Right Kidney: Length: 12.1 cm. Normal parenchymal echogenicity. Mild dilation of the renal pelvis. No caliectasis. There are multiple calcifications, largest arises from the lower pole measuring 2 cm. There are 2 renal sinus cysts, the more superior measuring 2.0 x 1.5 x 2.0 cm in the more inferior and larger measuring 2.5 x 1.8 x 2.3 cm. Left Kidney: Length: 12.1 cm. Normal parenchymal echogenicity. No  hydronephrosis. There are several intrarenal calculi, largest from the mid to lower pole measuring 15  mm. There is a simple cyst arising from the upper pole measuring 3.7 x 3.8 x 3.1 cm. No other masses. Bladder: Not visualized. IMPRESSION: 1. There are multiple bilateral renal collecting system calcifications, largest on the right measuring 2 cm and largest on the left measuring 1.5 cm. On the right, there is mild dilation of the right renal pelvis without caliectasis. No left hydronephrosis. 2. Bilateral renal cysts. 3. No other abnormalities. Electronically Signed   By: Lajean Manes M.D.   On: 01/29/2018 10:55   Dg Chest Port 1 View  Result Date: 01/31/2018 CLINICAL DATA:  Tachycardia and shortness of breath with congestion. EXAM: PORTABLE CHEST 1 VIEW COMPARISON:  01/28/2018. FINDINGS: Trachea is midline. Heart size stable. Thoracic aorta is calcified. Lungs are hyperinflated but clear. No pleural fluid. Old anterior right rib fractures simulate pulmonary nodules. IMPRESSION: 1. Hyperinflation without acute finding. 2.  Aortic atherosclerosis (ICD10-170.0). Electronically Signed   By: Lorin Picket M.D.   On: 01/31/2018 09:03   Dg Chest Port 1 View  Result Date: 01/28/2018 CLINICAL DATA:  Acute respiratory failure EXAM: PORTABLE CHEST 1 VIEW COMPARISON:  01/27/2018 FINDINGS: Lungs are clear.  No pleural effusion or pneumothorax. The heart is normal in size. IMPRESSION: No evidence of acute cardiopulmonary disease. Electronically Signed   By: Julian Hy M.D.   On: 01/28/2018 07:51   Dg Chest Portable 1 View  Result Date: 01/14/2018 CLINICAL DATA:  Shortness of breath this morning. EXAM: PORTABLE CHEST 1 VIEW COMPARISON:  01/13/2018, 09/13/2017 and 03/20/2017 FINDINGS: Lungs are hyperexpanded with subtle prominence of the bilateral infrahilar bronchovascular markings. No focal lobar consolidation or effusion. Mild increased lucency of the lungs. Cardiomediastinal silhouette and remainder of the  exam is unchanged. Old posterior right rib fractures. IMPRESSION: Subtle prominence of the bilateral infrahilar bronchovascular markings which may be due to a degree of vascular congestion or viral bronchopneumonia. Emphysematous disease. Electronically Signed   By: Marin Olp M.D.   On: 01/14/2018 09:21    Assessment and Plan  Anxiety/agitation-cannot use anxiolytics on patient; patient was admitted to nursing facility with when necessary Haldol, which of course we had to DC; will start patient on Seroquel 25 mg by mouth daily at bedtime; monitor results    Webb Silversmith D. Sheppard Coil, MD

## 2018-02-06 NOTE — Progress Notes (Signed)
Received notification that [facility] did not get orders for NIV at time of DC. Faxed face sheet H&P, DC note, and labs to Ronnell [place] at [facility] [phone] who will follow up with MD at [facility] to get order for NIV signed.

## 2018-02-07 ENCOUNTER — Encounter: Payer: Self-pay | Admitting: *Deleted

## 2018-02-10 ENCOUNTER — Encounter: Payer: Self-pay | Admitting: Internal Medicine

## 2018-02-10 DIAGNOSIS — F39 Unspecified mood [affective] disorder: Secondary | ICD-10-CM | POA: Insufficient documentation

## 2018-02-10 DIAGNOSIS — D649 Anemia, unspecified: Secondary | ICD-10-CM | POA: Insufficient documentation

## 2018-02-11 ENCOUNTER — Encounter: Payer: Self-pay | Admitting: Internal Medicine

## 2018-02-14 ENCOUNTER — Non-Acute Institutional Stay (SKILLED_NURSING_FACILITY): Payer: Medicare Other | Admitting: Internal Medicine

## 2018-02-14 ENCOUNTER — Encounter: Payer: Self-pay | Admitting: Internal Medicine

## 2018-02-14 DIAGNOSIS — F0391 Unspecified dementia with behavioral disturbance: Secondary | ICD-10-CM

## 2018-02-14 DIAGNOSIS — J101 Influenza due to other identified influenza virus with other respiratory manifestations: Secondary | ICD-10-CM

## 2018-02-14 DIAGNOSIS — F39 Unspecified mood [affective] disorder: Secondary | ICD-10-CM | POA: Diagnosis not present

## 2018-02-14 DIAGNOSIS — F3289 Other specified depressive episodes: Secondary | ICD-10-CM

## 2018-02-14 DIAGNOSIS — D649 Anemia, unspecified: Secondary | ICD-10-CM

## 2018-02-14 DIAGNOSIS — J9601 Acute respiratory failure with hypoxia: Secondary | ICD-10-CM

## 2018-02-14 DIAGNOSIS — E119 Type 2 diabetes mellitus without complications: Secondary | ICD-10-CM | POA: Diagnosis not present

## 2018-02-14 DIAGNOSIS — J441 Chronic obstructive pulmonary disease with (acute) exacerbation: Secondary | ICD-10-CM

## 2018-02-14 DIAGNOSIS — J9602 Acute respiratory failure with hypercapnia: Secondary | ICD-10-CM | POA: Diagnosis not present

## 2018-02-14 DIAGNOSIS — D696 Thrombocytopenia, unspecified: Secondary | ICD-10-CM | POA: Diagnosis not present

## 2018-02-14 NOTE — Progress Notes (Signed)
Provider: Noah Delaine. Sheppard Coil, MD Location:  [place] Room Number: [id number] Place of Service:  SNF ([phone])  PCP: Nche, [name], NP Patient Care Team: Nche, [name], NP as PCP - General (Internal Medicine) Florance, Tomasa Blase, RN as [place] Management  Extended Emergency Contact Information Primary Emergency Contact: [place] Phone: [phone] Relation: Son Secondary Emergency Contact: [place] of [place] Mobile Phone: [phone] Relation: Other  Allergies  Allergen Reactions  . Ibuprofen Other (See Comments)    bleeding    Chief Complaint  Patient presents with  . Discharge Note    HPI:  84 y.o. female  ith allergic rhinitis, COPD, dementia, diabetes, reflux, hypertension, and chronic respiratory failure on home O2, who presented to [facility], ED with complaint of shortness of breath that started acutely that morning. Patient was admitted to [facility] from 3/20-8 for acute COPD exacerbation with acute hypoxic and hypercapnic respiratory failure. Hospital course was exacerbated by acute kidney injury which improved with IV hydration and by thrombocytopenia. Patient was admitted to skilled nursing facility for OT/PT and is now ready to be discharged to home.    Past Medical History:  Diagnosis Date  . Allergic rhinitis   . COPD (chronic obstructive pulmonary disease) ([place])    O2 dependent  . Dementia    early stage  . DM (diabetes mellitus) ([place])   . Eczema   . GERD (gastroesophageal reflux disease)   . HTN (hypertension), benign   . Hx of colon cancer, stage I   . Osteoporosis     Past Surgical History:  Procedure Laterality Date  . ABDOMINAL HYSTERECTOMY  1975  . COLON SURGERY  01/2010  . UMBILICAL HERNIA REPAIR  01/2010     reports that she has quit smoking. She has a 60.00 pack-year smoking history. she has never used  smokeless tobacco. She reports that she does not drink alcohol or use drugs. Social History   Socioeconomic History  . Marital status: Widowed    Spouse name: Not on file  . Number of children: Not on file  . Years of education: Not on file  . Highest education level: Not on file  Social Needs  . Financial resource strain: Not on file  . Food insecurity - worry: Not on file  . Food insecurity - inability: Not on file  . Transportation needs - medical: Not on file  . Transportation needs - non-medical: Not on file  Occupational History  . Occupation: retired  Tobacco Use  . Smoking status: Former Smoker    Packs/day: 2.00    Years: 30.00    Pack years: 60.00  . Smokeless tobacco: Never Used  . Tobacco comment: quit smoking over 50 years ago  Substance and Sexual Activity  . Alcohol use: No  . Drug use: No  . Sexual activity: Not on file  Other Topics Concern  . Not on file  Social History Narrative  . Not on file    Pertinent  Health Maintenance Due  Topic Date Due  . HEMOGLOBIN A1C  05/15/26  . FOOT EXAM  10/13/1934  . OPHTHALMOLOGY EXAM  10/13/1936  . URINE MICROALBUMIN  10[birth year]13/1934  . PNA vac Low Risk Adult (2 of 2 - PCV13) 05/21/2011  . INFLUENZA VACCINE  02/26/2018 (Originally 06/28/2017)  . DEXA SCAN  Completed    Medications: Allergies as of 02/14/2018      Reactions  Ibuprofen Other (See Comments)   bleeding      Medication List        Accurate as of 02/14/18  4:33 PM. Always use your most recent med list.          acetaminophen 325 MG tablet Commonly known as:  TYLENOL Take 2 tablets (650 mg total) by mouth every 6 (six) hours as needed for mild pain (or Fever >/= 101).   aspirin EC 81 MG tablet Take 81 mg by mouth daily.   busPIRone 15 MG tablet Commonly known as:  BUSPAR Take 15 mg by mouth 2 (two) times daily.   ENSURE Take 237 mLs by mouth 2 (two) times daily between meals.   ipratropium-albuterol 0.5-2.5 (3) MG/3ML Soln Commonly  known as:  DUONEB Take 3 mLs by nebulization every 6 (six) hours as needed.   mirtazapine 15 MG tablet Commonly known as:  REMERON Take 15 mg by mouth at bedtime.   ondansetron 4 MG tablet Commonly known as:  ZOFRAN Take 4 mg by mouth every 8 (eight) hours as needed for nausea or vomiting.   QUEtiapine 25 MG tablet Commonly known as:  SEROQUEL Take 25 mg by mouth 2 (two) times daily.        Vitals:   02/14/18 1236  BP: (!) 164/67  Pulse: 72  Resp: (!) 22  Temp: (!) 97 F (36.1 C)  Weight: 106 lb 12.8 oz (48.4 kg)  Height: 5\' 3"  (1.6 m)   Body mass index is 18.92 kg/m.  Physical Exam  GENERAL APPEARANCE: Alert,  No acute distress.  HEENT: Unremarkable. RESPIRATORY: Breathing is even, unlabored. Lung sounds are clear   CARDIOVASCULAR: Heart RRR no murmurs, rubs or gallops. No peripheral edema.  GASTROINTESTINAL: Abdomen is soft, non-tender, not distended w/ normal bowel sounds.  NEUROLOGIC: Cranial nerves 2-12 grossly intact. Moves all extremities   Labs reviewed: Basic Metabolic Panel: Recent Labs    01/27/18 1556  01/31/18 0336 02/01/18 0516 02/02/18 0335 02/06/18  NA  --    < > 141 142 142 146  K  --    < > 4.5 4.4 4.2 3.8  CL  --    < > 104 103 104  --   CO2  --    < > 27 32 29  --   GLUCOSE  --    < > 94 97 73  --   BUN  --    < > 35* 32* 36* 21  CREATININE  --    < > 1.86* 1.77* 1.65* 1.23  CALCIUM  --    < > 8.7* 8.5* 8.3* 8.6  MG 2.3  --   --   --   --   --   PHOS 4.1  --   --   --   --   --    < > = values in this interval not displayed.   No results found for: [facility] Liver Function Tests: Recent Labs    03/20/17 1606 09/06/17 1409 01/14/18 0756  AST 19 22 41  ALT 12 13* 20  ALKPHOS 57 64 68  BILITOT 0.7 0.6 0.8  PROT 6.5 6.4* 6.4*  ALBUMIN 3.9 3.5 3.4*   No results for input(s): LIPASE, AMYLASE in the last 8760 hours. No results for input(s): AMMONIA in the last 8760 hours. CBC: Recent Labs    01/16/18 0213 01/17/18 0702  01/27/18 1152  01/31/18 0336 02/01/18 0516 02/02/18 0335  WBC 5.2 4.6 11.2*   < >  10.8* 8.6 7.8  NEUTROABS 4.4 3.1 8.5*  --   --   --   --   HGB 8.9* 9.4* 11.1*   < > 10.5* 10.2* 10.2*  HCT 30.6* 31.9* 37.1   < > 33.4* 33.2* 33.2*  MCV 93.6 93.0 93.7   < > 89.1 90.5 91.0  PLT 124* 143* 163   < > 158 142* 124*   < > = values in this interval not displayed.   Lipid No results for input(s): CHOL, HDL, LDLCALC, TRIG in the last 8760 hours. Cardiac Enzymes: No results for input(s): CKTOTAL, CKMB, CKMBINDEX, TROPONINI in the last 8760 hours. BNP: No results for input(s): BNP in the last 8760 hours. CBG: Recent Labs    02/02/18 0530 02/02/18 0802 02/02/18 1120  GLUCAP 99 88 96    Procedures and Imaging Studies During Stay: Dg Chest 2 View  Result Date: 01/27/2018 CLINICAL DATA:  Cough x1 week EXAM: CHEST  2 VIEW COMPARISON:  01/14/2018 FINDINGS: Prior right lower lobe opacity has improved. Mild left basilar opacity, likely scarring/atelectasis. Chronic right midlung/perihilar scarring. No pleural effusion or pneumothorax. The heart is normal in size. Mild degenerative changes of the visualized thoracolumbar spine. IMPRESSION: No evidence of acute cardiopulmonary disease. Electronically Signed   By: Julian Hy M.D.   On: 01/27/2018 11:32   Dg Shoulder Right  Result Date: 01/27/2018 CLINICAL DATA:  Status post fall, shoulder pain EXAM: RIGHT SHOULDER - 2+ VIEW COMPARISON:  None. FINDINGS: Generalized osteopenia. No acute fracture or dislocation. Mild osteoarthritis of the glenohumeral joint. Mild arthropathy of the acromioclavicular joint. IMPRESSION: No acute osseous injury of the right shoulder. Electronically Signed   By: Kathreen Devoid   On: 01/27/2018 15:15   Ct Head Wo Contrast  Result Date: 01/27/2018 CLINICAL DATA:  84 year old female with lethargy. Dementia. Cough for 1 week. EXAM: CT HEAD WITHOUT CONTRAST TECHNIQUE: Contiguous axial images were obtained from the base of the  skull through the vertex without intravenous contrast. COMPARISON:  Head CT 09/06/2017. FINDINGS: Brain: Mild cerebral atrophy. Patchy and confluent areas of decreased attenuation are noted throughout the deep and periventricular white matter of the cerebral hemispheres bilaterally, compatible with chronic microvascular ischemic disease. No evidence of acute infarction, hemorrhage, hydrocephalus, extra-axial collection or mass lesion/mass effect. Vascular: No hyperdense vessel or unexpected calcification. Skull: Normal. Negative for fracture or focal lesion. Sinuses/Orbits: No acute finding. Other: None. IMPRESSION: 1. No acute intracranial abnormalities. 2. Mild cerebral atrophy with extensive chronic microvascular ischemic changes in the cerebral white matter, as above, similar to the prior examination. Electronically Signed   By: Vinnie Langton M.D.   On: 01/27/2018 14:56   US Renal  Result Date: 01/29/2018 CLINICAL DATA:  Acute kidney injury. EXAM: RENAL / URINARY TRACT ULTRASOUND COMPLETE COMPARISON:  None. FINDINGS: Right Kidney: Length: 12.1 cm. Normal parenchymal echogenicity. Mild dilation of the renal pelvis. No caliectasis. There are multiple calcifications, largest arises from the lower pole measuring 2 cm. There are 2 renal sinus cysts, the more superior measuring 2.0 x 1.5 x 2.0 cm in the more inferior and larger measuring 2.5 x 1.8 x 2.3 cm. Left Kidney: Length: 12.1 cm. Normal parenchymal echogenicity. No hydronephrosis. There are several intrarenal calculi, largest from the mid to lower pole measuring 15 mm. There is a simple cyst arising from the upper pole measuring 3.7 x 3.8 x 3.1 cm. No other masses. Bladder: Not visualized. IMPRESSION: 1. There are multiple bilateral renal collecting system calcifications, largest on the right measuring  2 cm and largest on the left measuring 1.5 cm. On the right, there is mild dilation of the right renal pelvis without caliectasis. No left hydronephrosis.  2. Bilateral renal cysts. 3. No other abnormalities. Electronically Signed   By: Lajean Manes M.D.   On: 01/29/2018 10:55   Dg Chest Port 1 View  Result Date: 01/31/2018 CLINICAL DATA:  Tachycardia and shortness of breath with congestion. EXAM: PORTABLE CHEST 1 VIEW COMPARISON:  01/28/2018. FINDINGS: Trachea is midline. Heart size stable. Thoracic aorta is calcified. Lungs are hyperinflated but clear. No pleural fluid. Old anterior right rib fractures simulate pulmonary nodules. IMPRESSION: 1. Hyperinflation without acute finding. 2.  Aortic atherosclerosis (ICD10-170.0). Electronically Signed   By: Lorin Picket M.D.   On: 01/31/2018 09:03   Dg Chest Port 1 View  Result Date: 01/28/2018 CLINICAL DATA:  Acute respiratory failure EXAM: PORTABLE CHEST 1 VIEW COMPARISON:  01/27/2018 FINDINGS: Lungs are clear.  No pleural effusion or pneumothorax. The heart is normal in size. IMPRESSION: No evidence of acute cardiopulmonary disease. Electronically Signed   By: Julian Hy M.D.   On: 01/28/2018 07:51    Assessment/Plan:   COPD with acute exacerbation ([place])  Influenza A  Acute respiratory failure with hypoxia and hypercapnia (HCC)  Thrombocytopenia (HCC)  Chronic anemia  Mood disorder (HCC)  Type 2 diabetes mellitus without complication, unspecified whether long term insulin use (HCC)  Other depression  Dementia with behavioral disturbance, unspecified dementia type   Patient is being discharged with the following home health services:  OT/PT/nursing  Patient is being discharged with the following durable medical equipment:  Trilogy noninvasive BiPAP/stationary an portable gaseous 02 at 2 Lpm. Nasal cannula, glucometer  Patient's chronic respiratory failure due to COPD is life-threatening. Previous ABGs have documented high PCO2 and spirometry reveals severe obstructive ventilatory defect. Patient would benefit from noninvasive ventilation. Without this therapy the patient is at  high risk of worsening symptoms, worsening respiratory failure, need for ER visits and/or recurrent hospitalizations. Bilevel device unable to adequately support her nocturnal ventilation needs. Patient would benefit from an IV therapy with set tidal volumes and pressure support.  Patient has been advised to f/u with their PCP in 1-2 weeks to bring them up to date on their rehab stay.  Social services at facility was responsible for arranging this appointment.  Pt was provided with a 30 day supply of prescriptions for medications and refills must be obtained from their PCP.  For controlled substances, a more limited supply may be provided adequate until PCP appointment only.  Medications have been reconciled.   Time spent greater than 30 minutes;> 50% of time with patient was spent reviewing records, labs, tests and studies, counseling and developing plan of care  Noah Delaine. Sheppard Coil, MD

## 2018-02-15 ENCOUNTER — Encounter: Payer: Self-pay | Admitting: *Deleted

## 2018-02-19 ENCOUNTER — Telehealth: Payer: Self-pay | Admitting: *Deleted

## 2018-02-19 ENCOUNTER — Other Ambulatory Visit: Payer: Self-pay

## 2018-02-19 NOTE — Patient Outreach (Addendum)
[facility]) Care Management  02/19/2018  Kristiana Jacko [birth year] [id number]     Transition of Care Referral  Referral Date: 02/19/18 Referral Source: Family/Self Referral Date of Admission: 02/02/18 Diagnosis: acute on chronic respiratory failure Date of Discharge: 02/16/18 Facility: [facility]: Medicare   Outreach attempt # 1 to patient/family. Spoke with [facility] POA/dtr in law(Leseandra).   Social: Patient moved from [place] to [place] about a year and a half ago to be closer to her family. Dtr in law states that patient has been living with them since. Prior to admission patient was independent and fairly able to do for herself. However, since admission she is now dependent and needs assistance with all her ADLs/IADLs. She denies any falls this year. Last fall was in Oct. 2018.DME in the home include oxygen, wheelchair(borrowed), four wheel walker(per DIL pt needs two wheel one) and BiPAP machine. Dtr in Scientist, clinical (histocompatibility and immunogenetics) takes patient to MD appts.   Conditions: Per chart review, patient has PMH of COPD(oxygen dependent), dementia, depression, DM, eczema, GERD, HTN, osteoporosis, colon CA stage one. Caregiver states that they are having a hard time getting patient to use BiPAP at night. She is only wearing it about three hours at night. She is using her oxygen at 3L/min 24/7. Dtr voices that patient is worse off than when she went into the hospital. She voices that patient is not eating, very confused and sleeping a lot. RN CM discussed possible reasons why this is happening, ways to help symptoms and when to seek medical attention. Dtr states that patient did not have any skin issues prior to admission to rehab but has come home with several areas of "small bed sores." She has been applying Desitin to area. Advised dtr to just keep area dry and bandaged until assessed by an RN and/or MD for proper wound care instructions. She reports that  patient has never checked her blood sugars before and denies that patient has taken diabetic meds. However, she stets they were instructed by rehab to start monitoring patient's blood sugars in the ome. However, she voices that there was an issue at rehab and no script to obtain a meter was given. Dtr in law and patient son are overwhelmed by the care and medical issues that patient has going on and wants any and all support that they can get. She states patient was supposed to be set up with [facility]. However, no one from agency has contacted them. She did call the office today to follow up. Caregiver also spoke with [facility] RN while at rehab. Dtr in law very interested in [facility] services. She also voices that someone had recommended [facility] to them. She has info but has not had a chance to review it and is unsure if patient will qualify.    Medications: Med review completed with caregiver. She voices that patient has [employer] is able to get meds from free at [facility]. However, someone must pick up the meds in person and hand deliver scripts to pharmacy. She voices that sometimes they drive down there to get meds. She reports that other times they just use a local pharmacy and pay the minimal co pay. She denies any issues affording and obtaining meds. Patient was able to get neb medicine but caregiver voices that re having issues getting neb machine as script was not written for device at time of discharge. She voices that her husband hs already called facility to follow  up on the issue.  Appointments; Patient has PCP appt on tomorrow. Advised dtr to take all meds and discharge papers to appts. Dtr is aware that she needs to alert PCP of issues with DME(neb and cbg meter) as well as skin breakdown issues. Patient is also followed by Dr. Dayton Scrape) and Pulmonologist with [facility].    Advance Directives: Patient has [facility] POA document of file.   Consent: [facility] services reviewed and discussed with  caregiver. She gave verbal consent for [facility] services.   Plan: RN CM will send [facility] community referral for further in home eval/assessment of care needs and mgmt of chronic conditions.    Enzo Montgomery, RN,BSN,CCM [place] Management Telephonic Care Management Coordinator Direct Phone: [phone] Toll Free: [phone] Fax: [phone]

## 2018-02-19 NOTE — Telephone Encounter (Signed)
Caregiver called and stated that they had some medication concerns. Stated that the patient was discharged from [facility] on Friday.  Directed them to call [facility] with Concerns. Agreed.

## 2018-02-21 ENCOUNTER — Encounter: Payer: Self-pay | Admitting: Nurse Practitioner

## 2018-02-21 ENCOUNTER — Ambulatory Visit (INDEPENDENT_AMBULATORY_CARE_PROVIDER_SITE_OTHER): Payer: Medicare Other | Admitting: Nurse Practitioner

## 2018-02-21 VITALS — BP 104/74 | Temp 98.1°F | Wt 106.6 lb

## 2018-02-21 DIAGNOSIS — J441 Chronic obstructive pulmonary disease with (acute) exacerbation: Secondary | ICD-10-CM

## 2018-02-21 DIAGNOSIS — F39 Unspecified mood [affective] disorder: Secondary | ICD-10-CM | POA: Diagnosis not present

## 2018-02-21 DIAGNOSIS — F3289 Other specified depressive episodes: Secondary | ICD-10-CM | POA: Diagnosis not present

## 2018-02-21 DIAGNOSIS — K5900 Constipation, unspecified: Secondary | ICD-10-CM | POA: Diagnosis not present

## 2018-02-21 DIAGNOSIS — R739 Hyperglycemia, unspecified: Secondary | ICD-10-CM | POA: Diagnosis not present

## 2018-02-21 DIAGNOSIS — R5383 Other fatigue: Secondary | ICD-10-CM | POA: Diagnosis not present

## 2018-02-21 DIAGNOSIS — L89151 Pressure ulcer of sacral region, stage 1: Secondary | ICD-10-CM

## 2018-02-21 DIAGNOSIS — R410 Disorientation, unspecified: Secondary | ICD-10-CM | POA: Diagnosis not present

## 2018-02-21 LAB — BASIC METABOLIC PANEL
BUN: 17 mg/dL (ref 6–23)
CALCIUM: 9.1 mg/dL (ref 8.4–10.5)
CO2: 38 meq/L — AB (ref 19–32)
Chloride: 96 mEq/L (ref 96–112)
Creatinine, Ser: 0.77 mg/dL (ref 0.40–1.20)
GFR: 90.29 mL/min (ref >60.00)
GLUCOSE: 171 mg/dL — AB (ref 70–99)
Potassium: 4.8 mEq/L (ref 3.5–5.1)
Sodium: 140 mEq/L (ref 135–145)

## 2018-02-21 LAB — CBC
HEMATOCRIT: 27.4 % — AB (ref 36.0–46.0)
Hemoglobin: 8.5 g/dL — ABNORMAL LOW (ref 12.0–15.0)
MCHC: 31.1 g/dL (ref 30.0–36.0)
MCV: 91.1 fl (ref 78.0–100.0)
PLATELETS: 143 10*3/uL — AB (ref 150.0–400.0)
RBC: 3.01 Mil/uL — AB (ref 3.87–5.11)
RDW: 15.4 % (ref 11.5–15.5)
WBC: 3.5 10*3/uL — AB (ref 4.0–10.5)

## 2018-02-21 LAB — HEMOGLOBIN A1C: Hgb A1c MFr Bld: 6.3 % (ref 4.6–6.5)

## 2018-02-21 MED ORDER — QUETIAPINE FUMARATE 25 MG PO TABS: 25.0000 mg | ORAL_TABLET | ORAL | Status: DC

## 2018-02-21 MED ORDER — POLYETHYLENE GLYCOL 3350 17 GM/SCOOP PO POWD: 17.0000 g | Freq: Every day | ORAL | 3 refills | Status: DC

## 2018-02-21 MED ORDER — MIRTAZAPINE 15 MG PO TABS: 7.5000 mg | ORAL_TABLET | Freq: Every day | ORAL | Status: DC

## 2018-02-21 MED ORDER — BLOOD GLUCOSE MONITOR KIT: PACK | 0 refills | Status: DC

## 2018-02-21 MED ORDER — CVS HYDROCOLLOID PADS PADS: 1.0000 [IU] | MEDICATED_PAD | Status: DC

## 2018-02-21 MED ORDER — DOCUSATE SODIUM 100 MG PO CAPS: 100.0000 mg | ORAL_CAPSULE | Freq: Two times a day (BID) | ORAL | 3 refills | Status: AC

## 2018-02-21 NOTE — Progress Notes (Signed)
Subjective:  Patient ID: Samantha Zamora, female    DOB: 1924-02-08  Age: 84 y.o. MRN: [id number]  CC: Follow-up (hospital F/U. BiPaP machine is new and not working out very well, maybe keeps it on 48mn- 1 hours a day. Dementia, possible UTI, extreme weakness. CO2 level is a concern.) and COPD (difficutly breathing.)  Accompanied by son ([facility] and daughter in law. (provide all information during visit) [facility]  COPD  She complains of shortness of breath and wheezing. There is no chest tightness, cough, difficulty breathing, frequent throat clearing, hemoptysis, hoarse voice or sputum production. This is a chronic problem. The current episode started more than 1 year ago. The problem occurs constantly. The problem has been unchanged. Associated symptoms include dyspnea on exertion, malaise/fatigue and weight loss. Pertinent negatives include no appetite change, ear congestion, ear pain, fever, headaches, heartburn, myalgias, nasal congestion or trouble swallowing. Her symptoms are aggravated by any activity. Her symptoms are alleviated by beta-agonist, ipratropium and rest. She reports moderate improvement on treatment. Her past medical history is significant for COPD.  Constipation  This is a new problem. The current episode started in the past 7 days. The problem is unchanged. Her stool frequency is 2 to 3 times per week. The patient is not on a high fiber diet. She does not exercise regularly. There has been adequate water intake. Associated symptoms include fecal incontinence and weight loss. Pertinent negatives include no abdominal pain, back pain, bloating, diarrhea, fever, melena, nausea, rectal pain or vomiting. Risk factors include immobility. She has tried nothing for the symptoms.  Wound Check  She was originally treated 10 to 14 days ago (sacral skin changes since discharge from rehab per son and daughter in law). Previous  treatment included wound cleansing or irrigation. Her temperature was unmeasured prior to arrival. There has been no drainage from the wound. The redness has not changed. There is no swelling present. There is no pain present. She has no difficulty moving the affected extremity or digit.  Unsure about BiPAP use while in nursing home Unable to tolerate BiPAP for more than 2hrs. They sates she will pull mask off or constantly open her mouth which sets the alarm off.  During rehab stay she was prescribed seroquel and buspar due to increased agitation ("yelling in hallway when left alone"). They are not interested in additional home health services or PCA. Today her son and daughter complain of increased lethargy and somnolence since return from rehab.  Last BM 5days.  [facility] in [facility].  Son informs me his mother's code status is DNR. I informed son and daughter to consider palliative services which will help guide with her care and minimize hospitalization. They can get additional information from [place] [place] choice to do that.  Outpatient Medications Prior to Visit  Medication Sig Dispense Refill  . acetaminophen (TYLENOL) 325 MG tablet Take 2 tablets (650 mg total) by mouth every 6 (six) hours as needed for mild pain (or Fever >/= 101).    .Marland Kitchenaspirin EC 81 MG tablet Take 81 mg by mouth daily.    . busPIRone (BUSPAR) 15 MG tablet Take 15 mg by mouth 2 (two) times daily.    .Marland KitchenENSURE (ENSURE) Take 237 mLs by mouth 2 (two) times daily between meals.    . mirtazapine (REMERON) 15 MG tablet Take 15 mg by mouth at bedtime.    .Marland Kitchen  ondansetron (ZOFRAN) 4 MG tablet Take 4 mg by mouth every 8 (eight) hours as needed for nausea or vomiting.    Marland Kitchen QUEtiapine (SEROQUEL) 25 MG tablet Take 25 mg by mouth 2 (two) times daily.    Marland Kitchen ipratropium-albuterol (DUONEB) 0.5-2.5 (3) MG/3ML SOLN Take 3 mLs by nebulization every 6 (six) hours as needed. (Patient not  taking: Reported on 02/19/2018) 360 mL    No facility-administered medications prior to visit.     ROS See HPI  Objective:  BP 104/74 (BP Location: Right Arm, Patient Position: Sitting, Cuff Size: Normal)   Temp 98.1 F (36.7 C) (Oral)   Wt 106 lb 9.6 oz (48.4 kg)   BMI 18.88 kg/m   BP Readings from Last 3 Encounters:  02/21/18 104/74  02/14/18 (!) 164/67  02/06/18 (!) 153/85    Wt Readings from Last 3 Encounters:  02/21/18 106 lb 9.6 oz (48.4 kg)  02/14/18 106 lb 12.8 oz (48.4 kg)  02/06/18 106 lb 12.8 oz (48.4 kg)   Normal and regular radial pulse, unable to obtain pulse oximetry Oxygen dependent at 3l/Trail.  Physical Exam  Constitutional: She appears lethargic.  Cardiovascular: Normal rate.  Pulmonary/Chest: Effort normal. No respiratory distress. She has no wheezes. She has no rales. She exhibits no tenderness.  Very diminished lung sounds  Abdominal: Soft. Bowel sounds are normal. There is no tenderness.  Musculoskeletal:  Stands from wheelchair with one person assist.  Neurological: She appears lethargic.  Oriented to self only unable to remember her DOB. Appears lethargic.(in wheelchair with eyes closed and hair bowed during office visit, opens eyes and response verbally when called by name of touched.   Skin: Skin is warm, dry and intact. No rash noted. No erythema.     Vitals reviewed.   Lab Results  Component Value Date   WBC 7.8 02/02/2018   HGB 10.2 (L) 02/02/2018   HCT 33.2 (L) 02/02/2018   PLT 124 (L) 02/02/2018   GLUCOSE 73 02/02/2018   ALT 20 01/14/2018   AST 41 01/14/2018   NA 146 02/06/2018   K 3.8 02/06/2018   CL 104 02/02/2018   CREATININE 1.23 02/06/2018   BUN 21 02/06/2018   CO2 29 02/02/2018   TSH 0.84 03/20/2017    Dg Chest 2 View  Result Date: 01/27/2018 CLINICAL DATA:  Cough x1 week EXAM: CHEST  2 VIEW COMPARISON:  01/14/2018 FINDINGS: Prior right lower lobe opacity has improved. Mild left basilar opacity, likely  scarring/atelectasis. Chronic right midlung/perihilar scarring. No pleural effusion or pneumothorax. The heart is normal in size. Mild degenerative changes of the visualized thoracolumbar spine. IMPRESSION: No evidence of acute cardiopulmonary disease. Electronically Signed   By: Julian Hy M.D.   On: 01/27/2018 11:32   Dg Shoulder Right  Result Date: 01/27/2018 CLINICAL DATA:  Status post fall, shoulder pain EXAM: RIGHT SHOULDER - 2+ VIEW COMPARISON:  None. FINDINGS: Generalized osteopenia. No acute fracture or dislocation. Mild osteoarthritis of the glenohumeral joint. Mild arthropathy of the acromioclavicular joint. IMPRESSION: No acute osseous injury of the right shoulder. Electronically Signed   By: Kathreen Devoid   On: 01/27/2018 15:15   Ct Head Wo Contrast  Result Date: 01/27/2018 CLINICAL DATA:  84 year old female with lethargy. Dementia. Cough for 1 week. EXAM: CT HEAD WITHOUT CONTRAST TECHNIQUE: Contiguous axial images were obtained from the base of the skull through the vertex without intravenous contrast. COMPARISON:  Head CT 09/06/2017. FINDINGS: Brain: Mild cerebral atrophy. Patchy and confluent areas of decreased attenuation are  noted throughout the deep and periventricular white matter of the cerebral hemispheres bilaterally, compatible with chronic microvascular ischemic disease. No evidence of acute infarction, hemorrhage, hydrocephalus, extra-axial collection or mass lesion/mass effect. Vascular: No hyperdense vessel or unexpected calcification. Skull: Normal. Negative for fracture or focal lesion. Sinuses/Orbits: No acute finding. Other: None. IMPRESSION: 1. No acute intracranial abnormalities. 2. Mild cerebral atrophy with extensive chronic microvascular ischemic changes in the cerebral white matter, as above, similar to the prior examination. Electronically Signed   By: Vinnie Langton M.D.   On: 01/27/2018 14:56   Dg Chest Port 1 View  Result Date: 01/28/2018 CLINICAL DATA:   Acute respiratory failure EXAM: PORTABLE CHEST 1 VIEW COMPARISON:  01/27/2018 FINDINGS: Lungs are clear.  No pleural effusion or pneumothorax. The heart is normal in size. IMPRESSION: No evidence of acute cardiopulmonary disease. Electronically Signed   By: Julian Hy M.D.   On: 01/28/2018 07:51    Assessment & Plan:   Darrell was seen today for follow-up and copd.  Diagnoses and all orders for this visit:  Disorientation -     Cancel: POCT urinalysis dipstick -     Cancel: Urinalysis w microscopic + reflex cultur -     CBC -     Basic metabolic panel -     Urinalysis w microscopic + reflex cultur; Future  Lethargy -     CBC -     Basic metabolic panel -     Urinalysis w microscopic + reflex cultur; Future  Constipation, unspecified constipation type -     polyethylene glycol powder (GLYCOLAX/MIRALAX) powder; Take 17 g by mouth daily. -     docusate sodium (COLACE) 100 MG capsule; Take 1 capsule (100 mg total) by mouth 2 (two) times daily.  Hyperglycemia -     Hemoglobin A1c -     blood glucose meter kit and supplies KIT; Dispense based on patient and insurance preference. Use once daily as directed. ICD 10: R73.9  Pressure injury of sacral region, stage 1 -     Gauze Pads & Dressings ([employer] HYDROCOLLOID PADS) PADS; 1 Units by Does not apply route every 3 (three) days.  Chronic obstructive pulmonary disease with acute exacerbation (HCC) -     DME Nebulizer machine   I am having [facility] start on polyethylene glycol powder, docusate sodium, [employer] HYDROCOLLOID PADS, and blood glucose meter kit and supplies. I am also having her maintain her aspirin EC, mirtazapine, ipratropium-albuterol, ondansetron, acetaminophen, ENSURE, busPIRone, and QUEtiapine.  Meds ordered this encounter  Medications  . polyethylene glycol powder (GLYCOLAX/MIRALAX) powder    Sig: Take 17 g by mouth daily.    Dispense:  850 g    Refill:  3    Order Specific Question:   Supervising Provider      Answer:   Lucille Passy [[id number]]  . docusate sodium (COLACE) 100 MG capsule    Sig: Take 1 capsule (100 mg total) by mouth 2 (two) times daily.    Dispense:  60 capsule    Refill:  3    Order Specific Question:   Supervising Provider    Answer:   Lucille Passy [[id number]]  . Gauze Pads & Dressings ([employer] HYDROCOLLOID PADS) PADS    Sig: 1 Units by Does not apply route every 3 (three) days.    Order Specific Question:   Supervising Provider    Answer:   Lucille Passy [[id number]]  . blood glucose meter kit and supplies KIT  Sig: Dispense based on patient and insurance preference. Use once daily as directed. ICD 10: R73.9    Dispense:  1 each    Refill:  0    Order Specific Question:   Supervising Provider    Answer:   Lucille Passy [[id number]]    Order Specific Question:   Number of strips    Answer:   100    Order Specific Question:   Number of lancets    Answer:   100    Follow-up: Return in about 2 weeks (around 03/07/2018) for lethargy.Wilfred Lacy, NP

## 2018-02-21 NOTE — Patient Instructions (Addendum)
Decrease seroquel to once a day.(morning) and remeron half tab at bedtime.  Maintain current dose of buspar.  Go to lab for blood draw today.  Return urine sample to [facility] lab ASAP.  Go to hospital if symptoms worsen.  Continue to encourage prolonged use of BiPAP. Make appt with pulmonology ASAP.  Pressure Injury A pressure injury, sometimes called a bedsore, is an injury to the skin and underlying tissue caused by pressure. Pressure on blood vessels causes decreased blood flow to the skin, which can eventually cause the skin tissue to die and break down into a wound. Pressure injuries usually occur:  Over bony parts of the body such as the tailbone, shoulders, elbows, hips, and heels.  Under medical devices such as respiratory equipment, stockings, tubes, and splints.  Pressure injuries start as reddened areas on the skin and can lead to pain, muscle damage, and infection. Pressure injuries can vary in severity. What are the causes? Pressure injuries are caused by a lack of blood supply to an area of skin. They can occur from intense pressure over a short period of time or from less intense pressure over a long period of time. What increases the risk? This condition is more likely to develop in people who:  Are in the hospital or an extended care facility.  Are bedridden or in a wheelchair.  Have an injury or disease that keeps them from: ? Moving normally. ? Feeling pain or pressure.  Have a condition that: ? Makes them sleepy or less alert. ? Causes poor blood flow.  Need to wear a medical device.  Have poor control of their bladder or bowel functions (incontinence).  Have poor nutrition (malnutrition).  Are of certain ethnicities. People of African American and Latino or Hispanic descent are at higher risk compared to other ethnic groups.  If you are at risk for pressure ulcers, your health care provider may recommend certain types of bedding to help prevent them.  These may include foam or gel mattresses covered with one of the following:  A sheepskin blanket.  A pad that is filled with gel, air, water, or foam.  What are the signs or symptoms? The main symptom is a blister or change in skin color that opens into a wound. Other symptoms include:  Red or dark areas of skin that do not turn white or pale when pressed with a finger.  Pain, warmth, or change of skin texture.  How is this diagnosed? This condition is diagnosed with a medical history and physical exam. You may also have tests, including:  Blood tests to check for infection or signs of poor nutrition.  Imaging studies to check for damage to the deep tissues under your skin.  Blood flow studies.  Your pressure injury will be staged to determine its severity. Staging is an assessment of:  The depth of the pressure injury.  Which tissues are exposed because of the pressure injury.  The causes of the pressure injury.  How is this treated? The main focus of treatment is to help your injury heal. This may be done by:  Relieving or redistributing pressure on your skin. This includes: ? Frequently changing your position. ? Eliminating or minimizing positions that caused the wound or that can make the wound worse. ? Using specific bed mattresses and chair cushions. ? Refitting, resizing, or replacing any medical devices, or padding the skin under them. ? Using creams or powders to prevent rubbing (friction) on the skin.  Keeping your  skin clean and dry. This may include using a skin cleanser or skin protectant as told by your health care provider. This may be a lotion, ointment, or spray.  Cleaning your injury and removing any dead tissue from the wound (debridement).  Placing a bandage (dressing) over your injury.  Preventing or treating infection. This may include antibiotic, antimicrobial, or antiseptic medicines.  Treatment may also include medicine for pain. Sometimes  surgery is needed to close the wound with a flap of healthy skin or a piece of skin from another area of your body (graft). You may need surgery if other treatments are not working or if your injury is very deep. Follow these instructions at home: Wound care  Follow instructions from your health care provider about: ? How to take care of your wound. ? When and how you should change your dressing. ? When you should remove your dressing. If your dressing is dry and stuck when you try to remove it, moisten or wet the dressing with saline or water so that it can be removed without harming your skin or wound tissue.  Check your wound every day for signs of infection. Have a caregiver do this for you if you are not able. Watch for: ? More redness, swelling, or pain. ? More fluid, blood, or pus. ? A bad smell. Skin Care  Keep your skin clean and dry. Gently pat your skin dry.  Do not rub or massage your skin.  Use a skin protectant only as told by your health care provider.  Check your skin every day for any changes in color or any new blisters or sores (ulcers). Have a caregiver do this for you if you are not able. Medicines  Take over-the-counter and prescription medicines only as told by your health care provider.  If you were prescribed an antibiotic medicine, take it or apply it as told by your health care provider. Do not stop taking or using the antibiotic even if your condition improves. Reducing and Redistributing Pressure  Do not lie or sit in one position for a long time. Move or change position every two hours or as told by your health care provider.  Use pillows or cushions to reduce pressure. Ask your health care provider to recommend cushions or pads for you.  Use medical devices that do not rub your skin. Tell your health care provider if one of your medical devices is causing a pressure injury to develop. General instructions   Eat a healthy diet that includes lots of  protein. Ask your health care provider for diet advice.  Drink enough fluid to keep your urine clear or pale yellow.  Be as active as you can every day. Ask your health care provider to suggest safe exercises or activities.  Do not abuse drugs or alcohol.  Keep all follow-up visits as told by your health care provider. This is important.  Do not smoke. Contact a health care provider if:   You have chills or fever.  Your pain medicine is not helping.  You have any changes in skin color.  You have new blisters or sores.  You develop warmth, redness, or swelling near a pressure injury.  You have a bad odor or pus coming from your pressure injury.  You lose control of your bowels or bladder.  You develop new symptoms.  Your wound does not improve after 1-2 weeks of treatment.  You develop a new medical condition, such as diabetes, peripheral vascular disease,  or conditions that affect your defense (immune) system. This information is not intended to replace advice given to you by your health care provider. Make sure you discuss any questions you have with your health care provider. Document Released: 11/14/2005 Document Revised: 04/18/2016 Document Reviewed: 03/25/2015 Elsevier Interactive Patient Education  [employer].

## 2018-02-22 ENCOUNTER — Inpatient Hospital Stay: Payer: Medicare Other | Admitting: Nurse Practitioner

## 2018-02-22 ENCOUNTER — Other Ambulatory Visit: Payer: Medicare Other

## 2018-02-22 ENCOUNTER — Other Ambulatory Visit: Payer: Self-pay

## 2018-02-22 ENCOUNTER — Other Ambulatory Visit: Payer: Self-pay | Admitting: *Deleted

## 2018-02-22 ENCOUNTER — Telehealth: Payer: Self-pay

## 2018-02-22 DIAGNOSIS — R5383 Other fatigue: Secondary | ICD-10-CM

## 2018-02-22 DIAGNOSIS — Z9181 History of falling: Secondary | ICD-10-CM | POA: Diagnosis not present

## 2018-02-22 DIAGNOSIS — F0391 Unspecified dementia with behavioral disturbance: Secondary | ICD-10-CM | POA: Diagnosis not present

## 2018-02-22 DIAGNOSIS — Z85038 Personal history of other malignant neoplasm of large intestine: Secondary | ICD-10-CM | POA: Diagnosis not present

## 2018-02-22 DIAGNOSIS — K219 Gastro-esophageal reflux disease without esophagitis: Secondary | ICD-10-CM | POA: Diagnosis not present

## 2018-02-22 DIAGNOSIS — J9611 Chronic respiratory failure with hypoxia: Secondary | ICD-10-CM | POA: Diagnosis not present

## 2018-02-22 DIAGNOSIS — J101 Influenza due to other identified influenza virus with other respiratory manifestations: Secondary | ICD-10-CM | POA: Diagnosis not present

## 2018-02-22 DIAGNOSIS — R410 Disorientation, unspecified: Secondary | ICD-10-CM

## 2018-02-22 DIAGNOSIS — M47815 Spondylosis without myelopathy or radiculopathy, thoracolumbar region: Secondary | ICD-10-CM | POA: Diagnosis not present

## 2018-02-22 DIAGNOSIS — Z993 Dependence on wheelchair: Secondary | ICD-10-CM | POA: Diagnosis not present

## 2018-02-22 DIAGNOSIS — K5900 Constipation, unspecified: Secondary | ICD-10-CM

## 2018-02-22 DIAGNOSIS — F419 Anxiety disorder, unspecified: Secondary | ICD-10-CM | POA: Diagnosis not present

## 2018-02-22 DIAGNOSIS — F329 Major depressive disorder, single episode, unspecified: Secondary | ICD-10-CM | POA: Diagnosis not present

## 2018-02-22 DIAGNOSIS — I10 Essential (primary) hypertension: Secondary | ICD-10-CM | POA: Diagnosis not present

## 2018-02-22 DIAGNOSIS — Z7982 Long term (current) use of aspirin: Secondary | ICD-10-CM | POA: Diagnosis not present

## 2018-02-22 DIAGNOSIS — Z9981 Dependence on supplemental oxygen: Secondary | ICD-10-CM | POA: Diagnosis not present

## 2018-02-22 DIAGNOSIS — Z87891 Personal history of nicotine dependence: Secondary | ICD-10-CM | POA: Diagnosis not present

## 2018-02-22 DIAGNOSIS — D649 Anemia, unspecified: Secondary | ICD-10-CM | POA: Diagnosis not present

## 2018-02-22 DIAGNOSIS — S2241XD Multiple fractures of ribs, right side, subsequent encounter for fracture with routine healing: Secondary | ICD-10-CM | POA: Diagnosis not present

## 2018-02-22 DIAGNOSIS — E1151 Type 2 diabetes mellitus with diabetic peripheral angiopathy without gangrene: Secondary | ICD-10-CM | POA: Diagnosis not present

## 2018-02-22 DIAGNOSIS — M810 Age-related osteoporosis without current pathological fracture: Secondary | ICD-10-CM | POA: Diagnosis not present

## 2018-02-22 DIAGNOSIS — J441 Chronic obstructive pulmonary disease with (acute) exacerbation: Secondary | ICD-10-CM | POA: Diagnosis not present

## 2018-02-22 DIAGNOSIS — J9612 Chronic respiratory failure with hypercapnia: Secondary | ICD-10-CM | POA: Diagnosis not present

## 2018-02-22 DIAGNOSIS — R634 Abnormal weight loss: Secondary | ICD-10-CM

## 2018-02-22 NOTE — Patient Outreach (Signed)
[facility]) Care Management  02/22/2018  Samantha Zamora September 21, 1924 [id number]   Referral received from telephonic care manager for further in home assessment and transition of care as member was recently discharged from SNF.  Transition of care assessment complete by screening care manager.    Call placed to daughter in law to introduce self as assigned care manager.  She report member had appointment with MD yesterday.  There is concern for intestinal bleeding, she will have CT scan tomorrow.  She also state there was conversation regarding member's Bipap as she does not use it.  Daughter in law state they were told that if member does not wear, it could result in complications, including death.  She verbalizes understanding of such complications, state "we will just take it day by day."  She does report that they have the nebulizer and will use as needed.  She report visit did include assessment of concern for skin break down.  State they will just watch for now, using barrier to prevent further breakdown.  [facility] is active with member's care and member is scheduled for 2 week follow up.  She report member has been sleeping a lot today, but denies any urgent concerns.  Will follow up next week, home visit scheduled for within the next 2 weeks.   [facility] CM Care Plan Problem One     Most Recent Value  Care Plan Problem One  Risk for readmission related to respiratory failure as evidenced by recent hospitalization requiring stay at rehab  Role Documenting the Problem One  Care Management [place] for Problem One  Active  [place] Term Goal   Member will not be readmitted to acute care facility within the next 31 days  [facility] Long Term Goal Start Date  02/22/18  Interventions for Problem One Long Term Goal  Discussed with member the importance of following discharge instructions, including follow up appointments, medications, diet, and home health involvement, to  decrease the risk of readmission  [facility] CM Short Term Goal #1   Member will keep and attend follow up appointment with primary MD within the next 2 weeks  [facility] CM Short Term Goal #1 Start Date  02/22/18  Interventions for Short Term Goal #1  Confirmed member has next appointment scheduled.  Advised family of importance of keeping appointment in effort to decrease risk of readmission  THN CM Short Term Goal #2   Daughter in law will report increased compliance with Bipap machine over the next 4 weeks  [facility]N CM Short Term Goal #2 Start Date  02/22/18  Interventions for Short Term Goal #2  daughter in law educated on importance of using machine and complications if not used.     Valente David, [place], MSN [address] [phone]

## 2018-02-22 NOTE — Telephone Encounter (Signed)
-----   Message from Shawnie Pons, LPN sent at 6/68/1594  1:34 PM EDT ----- Pt's son came in the office and Selinda Eon gave test result:   PN & TG-I spoke with Pt Son/he is in agreement with CT Abd/Pelvix W/Contrast/States that she has not had a BM and she has a H/O Colon CA with resectioning surgery/He came while ya'll were at lunch/thx dmfPN & TG-I spoke with Pt Son/he is in agreement with CT Abd/Pelvix W/Contrast/States that she has not had a BM and she has a H/O Colon CA with resectioning surgery/He came while ya'll were at lunch/thx dmf

## 2018-02-22 NOTE — Progress Notes (Signed)
Pt's son dropped of urine sample.

## 2018-02-22 NOTE — Telephone Encounter (Signed)
PN & TG-I spoke with Pt Son/he is in agreement with CT Abd/Pelvix W/Contrast/States that she has not had a BM and she has a H/O Colon CA with resectioning surgery/He came while ya'll were at lunch/thx dmf

## 2018-02-23 ENCOUNTER — Telehealth: Payer: Self-pay | Admitting: Nurse Practitioner

## 2018-02-23 ENCOUNTER — Ambulatory Visit (INDEPENDENT_AMBULATORY_CARE_PROVIDER_SITE_OTHER)
Admission: RE | Admit: 2018-02-23 | Discharge: 2018-02-23 | Disposition: A | Discharge status: Home / Self Care | Payer: Medicare Other | Source: Ambulatory Visit | Attending: Nurse Practitioner | Admitting: Nurse Practitioner

## 2018-02-23 DIAGNOSIS — R634 Abnormal weight loss: Secondary | ICD-10-CM | POA: Diagnosis not present

## 2018-02-23 DIAGNOSIS — K5900 Constipation, unspecified: Secondary | ICD-10-CM | POA: Diagnosis not present

## 2018-02-23 DIAGNOSIS — N200 Calculus of kidney: Secondary | ICD-10-CM | POA: Diagnosis not present

## 2018-02-23 MED ORDER — IOPAMIDOL (ISOVUE-300) INJECTION 61%
80.0000 mL | Freq: Once | INTRAVENOUS | Status: AC | PRN
  Administered 2018-02-23: 80 mL via INTRAVENOUS

## 2018-02-23 NOTE — Telephone Encounter (Signed)
Copied from [place] [id number]. Topic: Inquiry >> Feb 23, 2018 12:49 PM Margot Ables wrote: Reason for CRM: [name] calling to f/u on lab results from yesterday bc pt is not acting herself (as noted in [place] notes 02/21/18)

## 2018-02-23 NOTE — Telephone Encounter (Signed)
Left vm for stacey to call back.

## 2018-02-23 NOTE — Telephone Encounter (Signed)
I received a call from [facility] about scheduling of STAT order. She contacted Samantha Zamora to schedule. While scheduling appointment with family member, Samantha Zamora was in the background screaming and agitated. Samantha Zamora family informed Erline Levine she has been this way for a couple of days. Samantha Zamora family is going to office now to pick up contrast. [facility] is asking for a order for IV as well, if Samantha Zamora is unable to tolerate or drink contrast. Samantha Zamora has an appointment today 02/23/18 @ 2pm. Please advise.

## 2018-02-23 NOTE — Telephone Encounter (Signed)
Spoke with Samantha Zamora, he is aware that [name] is not in the office but he is concern about Mrs. [place].   Can you please help on this in charlotte's absence?

## 2018-02-23 NOTE — Telephone Encounter (Signed)
Hgb has dropped from previous check, it appears this is one of the reasons why Samantha Zamora has ordered CT of abdomen as well. Renal function stable.  Urine culture from yesterday has not returned yet.  As I am not familiar with her baseline and if family feels that she is having significant change to her mental status I would recommend that she be seen in ED.

## 2018-02-23 NOTE — Telephone Encounter (Signed)
Ok to use IV contrast in place of PO contrast if unable to tolerate.  Recent creatinine reviewed/no allergies to iodinated contrast noted.

## 2018-02-23 NOTE — Telephone Encounter (Signed)
Spoke with Erline Levine, she is aware of response. Will call back if needed.

## 2018-02-23 NOTE — Telephone Encounter (Signed)
Please advise in charlotte's absence.  

## 2018-02-23 NOTE — Telephone Encounter (Signed)
[name] is aware of response below.   FYI to [place].

## 2018-02-24 ENCOUNTER — Encounter: Payer: Self-pay | Admitting: *Deleted

## 2018-02-24 LAB — URINALYSIS W MICROSCOPIC + REFLEX CULTURE
Bilirubin Urine: NEGATIVE
Glucose, UA: NEGATIVE
Ketones, ur: NEGATIVE
NITRITES URINE, INITIAL: NEGATIVE
PH: 6.5 (ref 5.0–8.0)
SPECIFIC GRAVITY, URINE: 1.014 (ref 1.001–1.03)

## 2018-02-24 LAB — URINE CULTURE
MICRO NUMBER:: 90393414
SPECIMEN QUALITY: ADEQUATE

## 2018-02-24 LAB — CULTURE INDICATED

## 2018-02-26 ENCOUNTER — Telehealth: Payer: Self-pay | Admitting: Neurology

## 2018-02-26 ENCOUNTER — Telehealth: Payer: Self-pay | Admitting: Nurse Practitioner

## 2018-02-26 DIAGNOSIS — J9611 Chronic respiratory failure with hypoxia: Secondary | ICD-10-CM | POA: Diagnosis not present

## 2018-02-26 DIAGNOSIS — J101 Influenza due to other identified influenza virus with other respiratory manifestations: Secondary | ICD-10-CM | POA: Diagnosis not present

## 2018-02-26 DIAGNOSIS — J9612 Chronic respiratory failure with hypercapnia: Secondary | ICD-10-CM | POA: Diagnosis not present

## 2018-02-26 DIAGNOSIS — E1151 Type 2 diabetes mellitus with diabetic peripheral angiopathy without gangrene: Secondary | ICD-10-CM | POA: Diagnosis not present

## 2018-02-26 DIAGNOSIS — J441 Chronic obstructive pulmonary disease with (acute) exacerbation: Secondary | ICD-10-CM | POA: Diagnosis not present

## 2018-02-26 DIAGNOSIS — S2241XD Multiple fractures of ribs, right side, subsequent encounter for fracture with routine healing: Secondary | ICD-10-CM | POA: Diagnosis not present

## 2018-02-26 NOTE — Telephone Encounter (Signed)
I've scheduled pt for Thursday, April 11 @ 8:30am.  The next available is May 23rd

## 2018-02-26 NOTE — Telephone Encounter (Signed)
Patient's son called and needs to get his mom a follow up appt soon. He said things are worse. The next available is in October? Please Advise. Thanks

## 2018-02-26 NOTE — Telephone Encounter (Signed)
Copied from [place] [id number]. Topic: Quick Communication - See Telephone Encounter >> Feb 26, 2018  4:12 PM Boyd Kerbs wrote: CRM for notification.   Mamie Nick - [facility] - [phone] verbal orders : PT 2 x 4; & ST eval. 1 x 1   See Telephone encounter for: 02/26/18.

## 2018-02-27 ENCOUNTER — Ambulatory Visit: Payer: Self-pay | Admitting: *Deleted

## 2018-02-27 DIAGNOSIS — J9611 Chronic respiratory failure with hypoxia: Secondary | ICD-10-CM | POA: Diagnosis not present

## 2018-02-27 DIAGNOSIS — J441 Chronic obstructive pulmonary disease with (acute) exacerbation: Secondary | ICD-10-CM | POA: Diagnosis not present

## 2018-02-27 DIAGNOSIS — J9612 Chronic respiratory failure with hypercapnia: Secondary | ICD-10-CM | POA: Diagnosis not present

## 2018-02-27 DIAGNOSIS — E1151 Type 2 diabetes mellitus with diabetic peripheral angiopathy without gangrene: Secondary | ICD-10-CM | POA: Diagnosis not present

## 2018-02-27 DIAGNOSIS — J101 Influenza due to other identified influenza virus with other respiratory manifestations: Secondary | ICD-10-CM | POA: Diagnosis not present

## 2018-02-27 DIAGNOSIS — S2241XD Multiple fractures of ribs, right side, subsequent encounter for fracture with routine healing: Secondary | ICD-10-CM | POA: Diagnosis not present

## 2018-02-27 NOTE — Telephone Encounter (Signed)
Samantha Zamora verbalized understand.

## 2018-02-27 NOTE — Telephone Encounter (Signed)
Patient's son is concerned because he has been checking his mother's glucose readings today and she has been 193, 235. He is concerned because while his mother was in the hospital she was getting insulin- but she is not on anything now. He is also concerned that they have not heard back from the office about the orders requested by hospice today- vitals for mother were: BP 102/60 O2 sat 93/94 T 97.4 they would like a call back today to make a plan for her. Discussed keeping good records- checking fasting glucose levels and 2 hours after meals and watching diet. Caryl [phone]  Reason for Disposition . [1] Blood glucose > 300 mg/dl (16.5 mmol/l) AND [2] two or more times in a row  Answer Assessment - Initial Assessment Questions 1. BLOOD GLUCOSE: "What is your blood glucose level?"      172 before eating  2. ONSET: "When did you check the blood glucose?"     3:00 3. USUAL RANGE: "What is your glucose level usually?" (e.g., usual fasting morning value, usual evening value)     Family has just started checking today 4. KETONES: "Do you check for ketones (urine or blood test strips)?" If yes, ask: "What does the test show now?"      no 5. TYPE 1 or 2:  "Do you know what type of diabetes you have?"  (e.g., Type 1, Type 2, Gestational; doesn't know)      Diet controled 6. INSULIN: "Do you take insulin?" If yes, ask: "Have you missed any shots recently?"     No 7. DIABETES PILLS: "Do you take any pills for your diabetes?" If yes, ask: "Have you missed taking any pills recently?"     no 8. OTHER SYMPTOMS: "Do you have any symptoms?" (e.g., fever, frequent urination, difficulty breathing, dizziness, weakness, vomiting)     Extreme thrist, dry skin, whinning- complaining 9. PREGNANCY: "Is there any chance you are pregnant?" "When was your last menstrual period?"     n/a  Protocols used: DIABETES - HIGH BLOOD SUGAR-A-AH

## 2018-02-27 NOTE — Telephone Encounter (Signed)
ok 

## 2018-02-27 NOTE — Telephone Encounter (Signed)
I am assuming he is referring to Home health orders. If so, that order was placed directly with the agency, so they should be reaching out to him. He should reach out to agency if he does not hear from them in 48hrs.  With regard to glucose reading, I will like to focus on fasting glucose (before breakfast) at this time. Due to her frail state, I do not recommend use of insulin or any glucose lowering tablet at this time unless fasting glucose is persistently >300. Please bring glucose reading to 2weeks f/up appt as discussed.

## 2018-02-28 ENCOUNTER — Telehealth: Payer: Self-pay | Admitting: Nurse Practitioner

## 2018-02-28 ENCOUNTER — Ambulatory Visit: Payer: Self-pay | Admitting: *Deleted

## 2018-02-28 DIAGNOSIS — J9602 Acute respiratory failure with hypercapnia: Secondary | ICD-10-CM

## 2018-02-28 DIAGNOSIS — D649 Anemia, unspecified: Secondary | ICD-10-CM

## 2018-02-28 DIAGNOSIS — J9611 Chronic respiratory failure with hypoxia: Secondary | ICD-10-CM

## 2018-02-28 DIAGNOSIS — F39 Unspecified mood [affective] disorder: Secondary | ICD-10-CM

## 2018-02-28 DIAGNOSIS — Z9981 Dependence on supplemental oxygen: Secondary | ICD-10-CM

## 2018-02-28 DIAGNOSIS — J9601 Acute respiratory failure with hypoxia: Secondary | ICD-10-CM

## 2018-02-28 DIAGNOSIS — E119 Type 2 diabetes mellitus without complications: Secondary | ICD-10-CM

## 2018-02-28 NOTE — Telephone Encounter (Signed)
Mr. Doristine Johns, Community relations representative with [employer] came to office for clinical notes and hospice care order. I informed me that he was contacted By [facility]. They informed him that they could no longer help Ms. [place] and think she will be a hospice candidate. Mr. Tamala Julian states he has no clinical notes from [facility], so he wanted to get information to determine if Ms. [place] was a hospice candidate. He states they have not yet discussed with [name] nor Caryl Ada about their services. I informed him about my conversation with Caryl Ada this morning and with [name] during the last office visit with regards to their expectations. Based on my conversations with [name] and Caryl Ada, I am afraid they do not understand the difference between hospice and palliative care. Especially when they told me they are expecting a hospital bed and 24hrs nursing care from hospice care. Mr. Tamala Julian stated he will reach out to Anitra Lauth to explain their services (palliative care vs hospice care vs home health care). He will then let me know via telephone if [facility]itra Lauth do want to proceed with their services or not.

## 2018-02-28 NOTE — Telephone Encounter (Signed)
Spoke with Pt son and reviewed [place]'s instructions. Son would like to speak with [name]. TLG

## 2018-02-28 NOTE — Telephone Encounter (Signed)
[name] and Manti had questions about Ms. [place]'s glucose readings and need for Hospice care through [facility]. I spoke primarily to [place] with [name] also talking in background.  1) Caryl Ada stated they were not sure about time to check glucose and how glucose was to be managed. I advised her to check glucose fasting (before first meal of the day or before breakfast). They are to contact me if fasting glucose if persistently >300 (>3readings). I also explained that diabetes management for frail elderly patient (as Ms. [place]) is not same as that for a younger patient, due to the risk of medication side effects and hypoglycemia. It is important to get a trend of glucose prior to implementing use of insulin and/or oral medications.This is even more important in presence of other co morbidities and decreased oral intake. They are to call office sooner if glucose is elevated prior to next office visit. LaShonda verbalized understanding  2)LaShonda also mentioned that Hospice care was recommended by [facility] health personnel, but was informed that I declined request. She stated hospice care services will include providing hospital bed and 24hrs nurse care. I informed her that I never received a request for Hospice care. I only received request for PT and ST, which was approved yesterday. She then stated that a personnel from [facility] will be calling my office or coming to office with Hospice forms to be signed. I informed her that I will contact her and [name] once i receive forms. She verbalized understanding.

## 2018-03-01 ENCOUNTER — Other Ambulatory Visit: Payer: Self-pay | Admitting: *Deleted

## 2018-03-01 DIAGNOSIS — D649 Anemia, unspecified: Secondary | ICD-10-CM | POA: Diagnosis not present

## 2018-03-01 DIAGNOSIS — F419 Anxiety disorder, unspecified: Secondary | ICD-10-CM | POA: Diagnosis not present

## 2018-03-01 DIAGNOSIS — E119 Type 2 diabetes mellitus without complications: Secondary | ICD-10-CM | POA: Diagnosis not present

## 2018-03-01 DIAGNOSIS — J449 Chronic obstructive pulmonary disease, unspecified: Secondary | ICD-10-CM | POA: Diagnosis not present

## 2018-03-01 DIAGNOSIS — F39 Unspecified mood [affective] disorder: Secondary | ICD-10-CM | POA: Diagnosis not present

## 2018-03-01 DIAGNOSIS — I10 Essential (primary) hypertension: Secondary | ICD-10-CM | POA: Diagnosis not present

## 2018-03-01 DIAGNOSIS — I4891 Unspecified atrial fibrillation: Secondary | ICD-10-CM | POA: Diagnosis not present

## 2018-03-01 DIAGNOSIS — J9601 Acute respiratory failure with hypoxia: Secondary | ICD-10-CM | POA: Diagnosis not present

## 2018-03-01 DIAGNOSIS — F0281 Dementia in other diseases classified elsewhere with behavioral disturbance: Secondary | ICD-10-CM | POA: Diagnosis not present

## 2018-03-01 NOTE — Telephone Encounter (Addendum)
Samantha Zamora called to say he spoke with the  family yesterday evening and they want to go ahead with hospice care.  Samantha Zamora's contact number is [phone]  He states the fax referral  can be faxed to  [phone]

## 2018-03-01 NOTE — Addendum Note (Signed)
Addended by: Wilfred Lacy L on: 03/01/2018 10:23 AM   Modules accepted: Orders

## 2018-03-01 NOTE — Telephone Encounter (Signed)
I called Samantha Zamora and Randal Buba to confirm their decision on Hospice vs palliative vs Home health care. Randal Buba informed me that they have decided to initiate Hospice care with [facility], and also maintain me as primary care provider. They also plan on maintaining upcoming appointment with me, pulmonary and neurology. I informed her that I will complete referral form and have it faxed.

## 2018-03-01 NOTE — Patient Outreach (Signed)
[facility] [facility]) Care Management  03/01/2018  Bailyn Spackman January 31, 1924 [id number]   Weekly transition of care call placed to member's daughter in law, no answer.  HIPAA compliant voice message left, will await call back.    Noted per chart that decision has been made to place member on hospice.  Will follow up with family tomorrow to confirm.  Will close case at that time.  Valente David, [place], MSN [place] [phone]

## 2018-03-02 ENCOUNTER — Other Ambulatory Visit: Payer: Self-pay | Admitting: *Deleted

## 2018-03-02 DIAGNOSIS — J9601 Acute respiratory failure with hypoxia: Secondary | ICD-10-CM | POA: Diagnosis not present

## 2018-03-02 DIAGNOSIS — E119 Type 2 diabetes mellitus without complications: Secondary | ICD-10-CM | POA: Diagnosis not present

## 2018-03-02 DIAGNOSIS — J449 Chronic obstructive pulmonary disease, unspecified: Secondary | ICD-10-CM | POA: Diagnosis not present

## 2018-03-02 DIAGNOSIS — I4891 Unspecified atrial fibrillation: Secondary | ICD-10-CM | POA: Diagnosis not present

## 2018-03-02 DIAGNOSIS — F0281 Dementia in other diseases classified elsewhere with behavioral disturbance: Secondary | ICD-10-CM | POA: Diagnosis not present

## 2018-03-02 DIAGNOSIS — I10 Essential (primary) hypertension: Secondary | ICD-10-CM | POA: Diagnosis not present

## 2018-03-02 NOTE — Patient Outreach (Signed)
[facility]) Care Management  03/02/2018  Samantha Zamora 11-08-1924 [id number]   Call placed to daughter in law, she confirms member is not active with hospice.  She is made aware that [facility] will now close case.  She verbalizes understanding.  Will notify primary MD.  Valente David, RN, MSN [place] Manager [phone]

## 2018-03-03 DIAGNOSIS — I10 Essential (primary) hypertension: Secondary | ICD-10-CM | POA: Diagnosis not present

## 2018-03-03 DIAGNOSIS — J449 Chronic obstructive pulmonary disease, unspecified: Secondary | ICD-10-CM | POA: Diagnosis not present

## 2018-03-03 DIAGNOSIS — F0281 Dementia in other diseases classified elsewhere with behavioral disturbance: Secondary | ICD-10-CM | POA: Diagnosis not present

## 2018-03-03 DIAGNOSIS — E119 Type 2 diabetes mellitus without complications: Secondary | ICD-10-CM | POA: Diagnosis not present

## 2018-03-03 DIAGNOSIS — J9601 Acute respiratory failure with hypoxia: Secondary | ICD-10-CM | POA: Diagnosis not present

## 2018-03-03 DIAGNOSIS — I4891 Unspecified atrial fibrillation: Secondary | ICD-10-CM | POA: Diagnosis not present

## 2018-03-06 DIAGNOSIS — I10 Essential (primary) hypertension: Secondary | ICD-10-CM | POA: Diagnosis not present

## 2018-03-06 DIAGNOSIS — E119 Type 2 diabetes mellitus without complications: Secondary | ICD-10-CM | POA: Diagnosis not present

## 2018-03-06 DIAGNOSIS — J449 Chronic obstructive pulmonary disease, unspecified: Secondary | ICD-10-CM | POA: Diagnosis not present

## 2018-03-06 DIAGNOSIS — I4891 Unspecified atrial fibrillation: Secondary | ICD-10-CM | POA: Diagnosis not present

## 2018-03-06 DIAGNOSIS — J9601 Acute respiratory failure with hypoxia: Secondary | ICD-10-CM | POA: Diagnosis not present

## 2018-03-06 DIAGNOSIS — F0281 Dementia in other diseases classified elsewhere with behavioral disturbance: Secondary | ICD-10-CM | POA: Diagnosis not present

## 2018-03-07 ENCOUNTER — Ambulatory Visit: Payer: Medicare Other | Admitting: Nurse Practitioner

## 2018-03-07 DIAGNOSIS — E119 Type 2 diabetes mellitus without complications: Secondary | ICD-10-CM | POA: Diagnosis not present

## 2018-03-07 DIAGNOSIS — J9601 Acute respiratory failure with hypoxia: Secondary | ICD-10-CM | POA: Diagnosis not present

## 2018-03-07 DIAGNOSIS — I4891 Unspecified atrial fibrillation: Secondary | ICD-10-CM | POA: Diagnosis not present

## 2018-03-07 DIAGNOSIS — F0281 Dementia in other diseases classified elsewhere with behavioral disturbance: Secondary | ICD-10-CM | POA: Diagnosis not present

## 2018-03-07 DIAGNOSIS — J449 Chronic obstructive pulmonary disease, unspecified: Secondary | ICD-10-CM | POA: Diagnosis not present

## 2018-03-07 DIAGNOSIS — I10 Essential (primary) hypertension: Secondary | ICD-10-CM | POA: Diagnosis not present

## 2018-03-08 ENCOUNTER — Ambulatory Visit: Payer: Medicare Other | Admitting: Neurology

## 2018-03-08 DIAGNOSIS — J9601 Acute respiratory failure with hypoxia: Secondary | ICD-10-CM | POA: Diagnosis not present

## 2018-03-08 DIAGNOSIS — J449 Chronic obstructive pulmonary disease, unspecified: Secondary | ICD-10-CM | POA: Diagnosis not present

## 2018-03-08 DIAGNOSIS — I4891 Unspecified atrial fibrillation: Secondary | ICD-10-CM | POA: Diagnosis not present

## 2018-03-08 DIAGNOSIS — F0281 Dementia in other diseases classified elsewhere with behavioral disturbance: Secondary | ICD-10-CM | POA: Diagnosis not present

## 2018-03-08 DIAGNOSIS — E119 Type 2 diabetes mellitus without complications: Secondary | ICD-10-CM | POA: Diagnosis not present

## 2018-03-08 DIAGNOSIS — I10 Essential (primary) hypertension: Secondary | ICD-10-CM | POA: Diagnosis not present

## 2018-03-10 DIAGNOSIS — E119 Type 2 diabetes mellitus without complications: Secondary | ICD-10-CM | POA: Diagnosis not present

## 2018-03-10 DIAGNOSIS — J449 Chronic obstructive pulmonary disease, unspecified: Secondary | ICD-10-CM | POA: Diagnosis not present

## 2018-03-10 DIAGNOSIS — I4891 Unspecified atrial fibrillation: Secondary | ICD-10-CM | POA: Diagnosis not present

## 2018-03-10 DIAGNOSIS — F0281 Dementia in other diseases classified elsewhere with behavioral disturbance: Secondary | ICD-10-CM | POA: Diagnosis not present

## 2018-03-10 DIAGNOSIS — I10 Essential (primary) hypertension: Secondary | ICD-10-CM | POA: Diagnosis not present

## 2018-03-10 DIAGNOSIS — J9601 Acute respiratory failure with hypoxia: Secondary | ICD-10-CM | POA: Diagnosis not present

## 2018-03-12 ENCOUNTER — Ambulatory Visit: Payer: Medicare Other | Admitting: Pulmonary Disease

## 2018-03-12 DIAGNOSIS — J449 Chronic obstructive pulmonary disease, unspecified: Secondary | ICD-10-CM | POA: Diagnosis not present

## 2018-03-12 DIAGNOSIS — I10 Essential (primary) hypertension: Secondary | ICD-10-CM | POA: Diagnosis not present

## 2018-03-12 DIAGNOSIS — F0281 Dementia in other diseases classified elsewhere with behavioral disturbance: Secondary | ICD-10-CM | POA: Diagnosis not present

## 2018-03-12 DIAGNOSIS — I4891 Unspecified atrial fibrillation: Secondary | ICD-10-CM | POA: Diagnosis not present

## 2018-03-12 DIAGNOSIS — E119 Type 2 diabetes mellitus without complications: Secondary | ICD-10-CM | POA: Diagnosis not present

## 2018-03-12 DIAGNOSIS — J9601 Acute respiratory failure with hypoxia: Secondary | ICD-10-CM | POA: Diagnosis not present

## 2018-03-13 ENCOUNTER — Other Ambulatory Visit: Payer: Self-pay | Admitting: Internal Medicine

## 2018-03-13 DIAGNOSIS — J9601 Acute respiratory failure with hypoxia: Secondary | ICD-10-CM | POA: Diagnosis not present

## 2018-03-13 DIAGNOSIS — J449 Chronic obstructive pulmonary disease, unspecified: Secondary | ICD-10-CM | POA: Diagnosis not present

## 2018-03-13 DIAGNOSIS — E119 Type 2 diabetes mellitus without complications: Secondary | ICD-10-CM | POA: Diagnosis not present

## 2018-03-13 DIAGNOSIS — F39 Unspecified mood [affective] disorder: Secondary | ICD-10-CM

## 2018-03-13 DIAGNOSIS — I10 Essential (primary) hypertension: Secondary | ICD-10-CM | POA: Diagnosis not present

## 2018-03-13 DIAGNOSIS — F0281 Dementia in other diseases classified elsewhere with behavioral disturbance: Secondary | ICD-10-CM | POA: Diagnosis not present

## 2018-03-13 DIAGNOSIS — I4891 Unspecified atrial fibrillation: Secondary | ICD-10-CM | POA: Diagnosis not present

## 2018-03-15 ENCOUNTER — Ambulatory Visit: Payer: Medicare Other | Admitting: Nurse Practitioner

## 2018-03-15 DIAGNOSIS — J449 Chronic obstructive pulmonary disease, unspecified: Secondary | ICD-10-CM | POA: Diagnosis not present

## 2018-03-15 DIAGNOSIS — I4891 Unspecified atrial fibrillation: Secondary | ICD-10-CM | POA: Diagnosis not present

## 2018-03-15 DIAGNOSIS — I10 Essential (primary) hypertension: Secondary | ICD-10-CM | POA: Diagnosis not present

## 2018-03-15 DIAGNOSIS — E119 Type 2 diabetes mellitus without complications: Secondary | ICD-10-CM | POA: Diagnosis not present

## 2018-03-15 DIAGNOSIS — J9601 Acute respiratory failure with hypoxia: Secondary | ICD-10-CM | POA: Diagnosis not present

## 2018-03-15 DIAGNOSIS — F0281 Dementia in other diseases classified elsewhere with behavioral disturbance: Secondary | ICD-10-CM | POA: Diagnosis not present

## 2018-03-16 DIAGNOSIS — I10 Essential (primary) hypertension: Secondary | ICD-10-CM | POA: Diagnosis not present

## 2018-03-16 DIAGNOSIS — J449 Chronic obstructive pulmonary disease, unspecified: Secondary | ICD-10-CM | POA: Diagnosis not present

## 2018-03-16 DIAGNOSIS — I4891 Unspecified atrial fibrillation: Secondary | ICD-10-CM | POA: Diagnosis not present

## 2018-03-16 DIAGNOSIS — J9601 Acute respiratory failure with hypoxia: Secondary | ICD-10-CM | POA: Diagnosis not present

## 2018-03-16 DIAGNOSIS — F0281 Dementia in other diseases classified elsewhere with behavioral disturbance: Secondary | ICD-10-CM | POA: Diagnosis not present

## 2018-03-16 DIAGNOSIS — E119 Type 2 diabetes mellitus without complications: Secondary | ICD-10-CM | POA: Diagnosis not present

## 2018-03-20 ENCOUNTER — Other Ambulatory Visit: Payer: Self-pay

## 2018-03-21 DIAGNOSIS — I4891 Unspecified atrial fibrillation: Secondary | ICD-10-CM | POA: Diagnosis not present

## 2018-03-21 DIAGNOSIS — J9601 Acute respiratory failure with hypoxia: Secondary | ICD-10-CM | POA: Diagnosis not present

## 2018-03-21 DIAGNOSIS — J449 Chronic obstructive pulmonary disease, unspecified: Secondary | ICD-10-CM | POA: Diagnosis not present

## 2018-03-21 DIAGNOSIS — I10 Essential (primary) hypertension: Secondary | ICD-10-CM | POA: Diagnosis not present

## 2018-03-21 DIAGNOSIS — E119 Type 2 diabetes mellitus without complications: Secondary | ICD-10-CM | POA: Diagnosis not present

## 2018-03-21 DIAGNOSIS — F0281 Dementia in other diseases classified elsewhere with behavioral disturbance: Secondary | ICD-10-CM | POA: Diagnosis not present

## 2018-03-22 DIAGNOSIS — J9601 Acute respiratory failure with hypoxia: Secondary | ICD-10-CM | POA: Diagnosis not present

## 2018-03-22 DIAGNOSIS — E119 Type 2 diabetes mellitus without complications: Secondary | ICD-10-CM | POA: Diagnosis not present

## 2018-03-22 DIAGNOSIS — I10 Essential (primary) hypertension: Secondary | ICD-10-CM | POA: Diagnosis not present

## 2018-03-22 DIAGNOSIS — I4891 Unspecified atrial fibrillation: Secondary | ICD-10-CM | POA: Diagnosis not present

## 2018-03-22 DIAGNOSIS — J449 Chronic obstructive pulmonary disease, unspecified: Secondary | ICD-10-CM | POA: Diagnosis not present

## 2018-03-22 DIAGNOSIS — F0281 Dementia in other diseases classified elsewhere with behavioral disturbance: Secondary | ICD-10-CM | POA: Diagnosis not present

## 2018-03-24 ENCOUNTER — Inpatient Hospital Stay (HOSPITAL_COMMUNITY)
Admission: EM | Admit: 2018-03-24 | Discharge: 2018-03-27 | DRG: 190 | Disposition: A | Discharge status: Hospice | Payer: Medicare Other | Attending: Internal Medicine | Admitting: Internal Medicine

## 2018-03-24 ENCOUNTER — Emergency Department (HOSPITAL_COMMUNITY): Payer: Medicare Other

## 2018-03-24 DIAGNOSIS — D638 Anemia in other chronic diseases classified elsewhere: Secondary | ICD-10-CM | POA: Diagnosis present

## 2018-03-24 DIAGNOSIS — F0391 Unspecified dementia with behavioral disturbance: Secondary | ICD-10-CM | POA: Diagnosis present

## 2018-03-24 DIAGNOSIS — J90 Pleural effusion, not elsewhere classified: Secondary | ICD-10-CM

## 2018-03-24 DIAGNOSIS — K5900 Constipation, unspecified: Secondary | ICD-10-CM

## 2018-03-24 DIAGNOSIS — R0902 Hypoxemia: Secondary | ICD-10-CM | POA: Diagnosis not present

## 2018-03-24 DIAGNOSIS — E119 Type 2 diabetes mellitus without complications: Secondary | ICD-10-CM | POA: Diagnosis not present

## 2018-03-24 DIAGNOSIS — E872 Acidosis: Secondary | ICD-10-CM | POA: Diagnosis present

## 2018-03-24 DIAGNOSIS — M810 Age-related osteoporosis without current pathological fracture: Secondary | ICD-10-CM | POA: Diagnosis present

## 2018-03-24 DIAGNOSIS — I10 Essential (primary) hypertension: Secondary | ICD-10-CM | POA: Diagnosis present

## 2018-03-24 DIAGNOSIS — Z7982 Long term (current) use of aspirin: Secondary | ICD-10-CM

## 2018-03-24 DIAGNOSIS — R0602 Shortness of breath: Secondary | ICD-10-CM | POA: Diagnosis not present

## 2018-03-24 DIAGNOSIS — J9622 Acute and chronic respiratory failure with hypercapnia: Secondary | ICD-10-CM | POA: Diagnosis not present

## 2018-03-24 DIAGNOSIS — R1310 Dysphagia, unspecified: Secondary | ICD-10-CM | POA: Diagnosis present

## 2018-03-24 DIAGNOSIS — F3289 Other specified depressive episodes: Secondary | ICD-10-CM

## 2018-03-24 DIAGNOSIS — K219 Gastro-esophageal reflux disease without esophagitis: Secondary | ICD-10-CM | POA: Diagnosis present

## 2018-03-24 DIAGNOSIS — R0603 Acute respiratory distress: Secondary | ICD-10-CM | POA: Diagnosis not present

## 2018-03-24 DIAGNOSIS — Z90710 Acquired absence of both cervix and uterus: Secondary | ICD-10-CM

## 2018-03-24 DIAGNOSIS — J441 Chronic obstructive pulmonary disease with (acute) exacerbation: Secondary | ICD-10-CM | POA: Diagnosis not present

## 2018-03-24 DIAGNOSIS — Z886 Allergy status to analgesic agent status: Secondary | ICD-10-CM

## 2018-03-24 DIAGNOSIS — Z85038 Personal history of other malignant neoplasm of large intestine: Secondary | ICD-10-CM

## 2018-03-24 DIAGNOSIS — Z79899 Other long term (current) drug therapy: Secondary | ICD-10-CM

## 2018-03-24 DIAGNOSIS — R600 Localized edema: Secondary | ICD-10-CM | POA: Diagnosis present

## 2018-03-24 DIAGNOSIS — J9621 Acute and chronic respiratory failure with hypoxia: Secondary | ICD-10-CM | POA: Diagnosis present

## 2018-03-24 DIAGNOSIS — Z9981 Dependence on supplemental oxygen: Secondary | ICD-10-CM

## 2018-03-24 DIAGNOSIS — Z66 Do not resuscitate: Secondary | ICD-10-CM | POA: Diagnosis present

## 2018-03-24 DIAGNOSIS — R609 Edema, unspecified: Secondary | ICD-10-CM | POA: Diagnosis not present

## 2018-03-24 DIAGNOSIS — F0390 Unspecified dementia without behavioral disturbance: Secondary | ICD-10-CM | POA: Diagnosis present

## 2018-03-24 DIAGNOSIS — L8990 Pressure ulcer of unspecified site, unspecified stage: Secondary | ICD-10-CM | POA: Diagnosis present

## 2018-03-24 DIAGNOSIS — Z515 Encounter for palliative care: Secondary | ICD-10-CM | POA: Diagnosis present

## 2018-03-24 DIAGNOSIS — Z87891 Personal history of nicotine dependence: Secondary | ICD-10-CM

## 2018-03-24 DIAGNOSIS — J449 Chronic obstructive pulmonary disease, unspecified: Secondary | ICD-10-CM | POA: Diagnosis present

## 2018-03-24 LAB — COMPREHENSIVE METABOLIC PANEL
ALK PHOS: 62 U/L (ref 38–126)
ALT: 12 U/L — AB (ref 14–54)
AST: 20 U/L (ref 15–41)
Albumin: 3.2 g/dL — ABNORMAL LOW (ref 3.5–5.0)
Anion gap: 6 (ref 5–15)
BILIRUBIN TOTAL: 0.8 mg/dL (ref 0.3–1.2)
BUN: 13 mg/dL (ref 6–20)
CO2: 42 mmol/L — ABNORMAL HIGH (ref 22–32)
CREATININE: 0.74 mg/dL (ref 0.44–1.00)
Calcium: 9.5 mg/dL (ref 8.9–10.3)
Chloride: 92 mmol/L — ABNORMAL LOW (ref 101–111)
GFR calc Af Amer: >60 mL/min (ref >60)
Glucose, Bld: 175 mg/dL — ABNORMAL HIGH (ref 65–99)
Potassium: 4.2 mmol/L (ref 3.5–5.1)
Sodium: 140 mmol/L (ref 135–145)
TOTAL PROTEIN: 5.5 g/dL — AB (ref 6.5–8.1)

## 2018-03-24 LAB — CBC
HEMATOCRIT: 33.6 % — AB (ref 36.0–46.0)
HEMOGLOBIN: 9.7 g/dL — AB (ref 12.0–15.0)
MCH: 27.9 pg (ref 26.0–34.0)
MCHC: 28.9 g/dL — ABNORMAL LOW (ref 30.0–36.0)
MCV: 96.6 fL (ref 78.0–100.0)
Platelets: 152 10*3/uL (ref 150–400)
RBC: 3.48 MIL/uL — AB (ref 3.87–5.11)
RDW: 13.6 % (ref 11.5–15.5)
WBC: 6.9 10*3/uL (ref 4.0–10.5)

## 2018-03-24 LAB — BLOOD GAS, ARTERIAL
ACID-BASE EXCESS: 16.7 mmol/L — AB (ref 0.0–2.0)
Bicarbonate: 44.5 mmol/L — ABNORMAL HIGH (ref 20.0–28.0)
Drawn by: 40415
O2 CONTENT: 2 L/min
O2 SAT: 94.9 %
PCO2 ART: 107 mmHg — AB (ref 32.0–48.0)
PH ART: 7.244 — AB (ref 7.350–7.450)
PO2 ART: 82.7 mmHg — AB (ref 83.0–108.0)
Patient temperature: 98.6

## 2018-03-24 LAB — CBG MONITORING, ED: Glucose-Capillary: 156 mg/dL — ABNORMAL HIGH (ref 65–99)

## 2018-03-24 NOTE — ED Provider Notes (Signed)
Level 5 caveat patient with dementia.  History is obtained from paramedics.  Patient was found by EMS to be dyspneic with respiratory rate of 4.  EMS treated patient with hard cervical collar for "airway positioning" albuterol nebulized treatment and Solu-Medrol 125 mg intravenously with market improvement of breathing. Patient is DNR /DNI CODE STATUS.  Patient placed on BiPAP, ordered by me.  I had a lengthy conversation with patient's son and daughter-in-law who request that she stay overnight.  She does have BiPAP at home but refuses to keep it on.  At 10:50 PM patient is more alert and moves all extremities.  Hospitalist service consulted to  arrange for overnight stay Chest x-ray viewed by me Arterial blood gas consistent with acute respiratory failure CRITICAL CARE Performed by: Orlie Dakin Total critical care time:  minutes Critical care time was exclusive of separately billable procedures and treating other patients. Critical care was necessary to treat or prevent imminent or life-threatening deterioration. Critical care was time spent personally by me on the following activities: development of treatment plan with patient and/or surrogate as well as nursing, discussions with consultants, evaluation of patient's response to treatment, examination of patient, obtaining history from patient or surrogate, ordering and performing treatments and interventions, ordering and review of laboratory studies, ordering and review of radiographic studies, pulse oximetry and re-evaluation of patient's condition.   Orlie Dakin, MD 03/24/18 2257

## 2018-03-24 NOTE — ED Triage Notes (Signed)
Pt was called by son for respiratory distress; Pt was found by fire department unresponsive to sternal rub, closed airway. Pt was placed in a c-collar to open airway, which was sucessful. Respiratory drive and responsiveness improved with repositioning, pt received 5 mg abuderol nebulizer 125 mg solumederol en route.  Pt's capnography 50-70 EtCo2 with hx of trapping  Pt has 20 Rforearm  Dementia at baseline

## 2018-03-24 NOTE — ED Triage Notes (Signed)
Hx of COPD; initial RR 4, increased to 10. Family declined to intubate in field.

## 2018-03-24 NOTE — H&P (Addendum)
History and Physical    Samantha Zamora ZPH:[id number] DOB: 01-14-1924 DOA: 03/24/2018  Referring MD/NP/PA: Franchot Heidelberg, PA-C PCP: Flossie Buffy, NP  Patient coming from: Home via EMS  Chief Complaint: Respiratory distress  I have personally briefly reviewed patient's old medical records in [place]   HPI: Samantha Zamora is a 84 y.o. female with medical history significant of End stage COPD, oxygen dependent on 3 L, HTN, DM type II, dementia, on hospice care, and osteoporosis; who presents with respiratory distress. History is obtained from the side as the patient is unable to give her own history at this time.  At baseline patient requires full assistance for all ADLs.  She had appeared to be less responsive and witnessed to have slumped over with increased work of breathing.  The patient had just recently been hospitalized last month for same.  Due to her dementia patient had been placed on Ativan prn and increased dose of Seroquel.  Son notes that she was recommended to be on BiPAP at night, but reports that the patient always takes this off and never keeps it on any significant part of the night.  She has had a intermittent cough, but remained unchanged.  Son notes that she has had increased difficulty in swallowing that they question may be related to her dementia and lower extremity swelling.  Son denies patient having any recent sick contacts, nausea, vomiting, or chest pain complaints.  Upon EMS patient was placed in a cervical collar with increase in respiratory drive and responsiveness.  Patient was given 5 mg of albuterol and 125 mg of Solu-Medrol in route. Patient's capnography 50-70 EtCo2.   Patient was given 125 mg of Solu-Medrol IV and albuterol breathing treatment. Patient is currently on hospice and wishes to be DNR/DNI.  ED Course: Upon admission patient was noted to be in significant respiratory distress and was placed on BiPAP.  Initial ABG noted pH  7.244, PCO2 107, PaO2 82.7 on BiPAP.   Patient was given breathing treatments and TRH called to admit.  Review of Systems  Unable to perform ROS: Mental status change  Respiratory: Positive for cough and shortness of breath.   Cardiovascular: Positive for leg swelling.  Gastrointestinal: Negative for nausea and vomiting.       Positive for difficulty swallowing    Past Medical History:  Diagnosis Date  . Allergic rhinitis   . COPD (chronic obstructive pulmonary disease) ([place])    O2 dependent  . Dementia    early stage  . DM (diabetes mellitus) ([place])   . Eczema   . GERD (gastroesophageal reflux disease)   . HTN (hypertension), benign   . Hx of colon cancer, stage I   . Osteoporosis     Past Surgical History:  Procedure Laterality Date  . ABDOMINAL HYSTERECTOMY  1975  . COLON SURGERY  01/2010  . UMBILICAL HERNIA REPAIR  01/2010     reports that she has quit smoking. She has a 60.00 pack-year smoking history. She has never used smokeless tobacco. She reports that she does not drink alcohol or use drugs.  Allergies  Allergen Reactions  . Ibuprofen Other (See Comments)    bleeding    Family History  Problem Relation Age of Onset  . Aneurysm Mother     Prior to Admission medications   Medication Sig Start Date End Date Taking? Authorizing Provider  acetaminophen (TYLENOL) 325 MG tablet Take 2 tablets (650 mg total) by mouth every 6 (six) hours as needed  for mild pain (or Fever >/= 101). 02/02/18  Yes Cherene Altes, MD  aspirin EC 81 MG tablet Take 81 mg by mouth daily.   Yes [provider]  blood glucose meter kit and supplies KIT Dispense based on patient and insurance preference. Use once daily as directed. ICD 10: R73.9 02/21/18  Yes Nche, [name], NP  busPIRone (BUSPAR) 15 MG tablet Take 5 mg by mouth daily.    Yes [provider]  docusate sodium (COLACE) 100 MG capsule Take 1 capsule (100 mg total) by mouth 2 (two) times daily. Patient  taking differently: Take 100 mg by mouth daily as needed for mild constipation.  02/21/18  Yes Nche, [name], NP  ipratropium-albuterol (DUONEB) 0.5-2.5 (3) MG/3ML SOLN Take 3 mLs by nebulization every 6 (six) hours as needed. Patient taking differently: Take 3 mLs by nebulization every 6 (six) hours as needed.  01/19/18  Yes Annita Brod, MD  LORazepam (ATIVAN) 1 MG tablet Take 0.5 mg by mouth every 4 (four) hours as needed for anxiety.   Yes [provider]  mirtazapine (REMERON) 15 MG tablet Take 0.5 tablets (7.5 mg total) by mouth at bedtime. Patient taking differently: Take 15 mg by mouth at bedtime.  02/21/18  Yes Nche, [name], NP  ondansetron (ZOFRAN) 4 MG tablet Take 4 mg by mouth every 8 (eight) hours as needed for nausea or vomiting.   Yes [provider]  ondansetron (ZOFRAN-ODT) 4 MG disintegrating tablet TK 1 T PO Q 8 H PRN N/V 02/16/18  Yes [provider]  polyethylene glycol powder (GLYCOLAX/MIRALAX) powder Take 17 g by mouth daily. Patient taking differently: Take 17 g by mouth daily as needed for mild constipation.  02/21/18  Yes Nche, [name], NP  QUEtiapine (SEROQUEL) 25 MG tablet Take 1 tablet (25 mg total) by mouth every morning. Patient taking differently: Take 50 mg by mouth 2 (two) times daily.  02/21/18  Yes Nche, [name], NP    Physical Exam:  Constitutional: Lethargic elderly female who does not respond to verbal commands at this time Vitals:   03/24/18 2230 03/24/18 2245 03/24/18 2300 03/24/18 2315  BP: (!) 114/45 (!) 120/51 (!) 109/51 (!) 115/51  Pulse: 99 90 89 87  Resp: _0 Temp:      TempSrc:      SpO2: 92% 93% 94% 92%  Weight:      Height:       Eyes: PERRL, lids and conjunctivae normal ENMT: Mucous membranes are dry. Posterior pharynx clear of any exudate or lesions. Poor dentition Neck: normal, supple, no masses, no thyromegaly Respiratory: currently on BiPAP. Patient mildly tachypneic with  significantly decreased air movement. Cardiovascular: Distant heart sounds, no murmurs / rubs / gallops.  2+ pitting lower extremity edema. 2+ pedal pulses. No carotid bruits.  Abdomen: no tenderness, no masses palpated. No hepatosplenomegaly. Bowel sounds positive.  Musculoskeletal: no clubbing / cyanosis. No joint deformity upper and lower extremities.   Bilateral lower extremity muscle wasting noted.  Skin: no rashes, lesions, ulcers. No induration Neurologic: CN 2-12 grossly intact.  Patient able to move all extremities Psychiatric: Lethargic, not easily arousable at this time   Labs on Admission: I have personally reviewed following labs and imaging studies  CBC: Recent Labs  Lab 03/24/18 2207  WBC 6.9  HGB 9.7*  HCT 33.6*  MCV 96.6  PLT 732   Basic Metabolic Panel: Recent Labs  Lab 03/24/18 2207  NA  140  K 4.2  CL 92*  CO2 42*  GLUCOSE 175*  BUN 13  CREATININE 0.74  CALCIUM 9.5   GFR: Estimated Creatinine Clearance: 34.8 mL/min (by C-G formula based on SCr of 0.74 mg/dL). Liver Function Tests: Recent Labs  Lab 03/24/18 2207  AST 20  ALT 12*  ALKPHOS 62  BILITOT 0.8  PROT 5.5*  ALBUMIN 3.2*   No results for input(s): LIPASE, AMYLASE in the last 168 hours. No results for input(s): AMMONIA in the last 168 hours. Coagulation Profile: No results for input(s): INR, PROTIME in the last 168 hours. Cardiac Enzymes: No results for input(s): CKTOTAL, CKMB, CKMBINDEX, TROPONINI in the last 168 hours. BNP (last 3 results) No results for input(s): PROBNP in the last 8760 hours. HbA1C: No results for input(s): HGBA1C in the last 72 hours. CBG: Recent Labs  Lab 03/24/18 2206  GLUCAP 156*   Lipid Profile: No results for input(s): CHOL, HDL, LDLCALC, TRIG, CHOLHDL, LDLDIRECT in the last 72 hours. Thyroid Function Tests: No results for input(s): TSH, T4TOTAL, FREET4, T3FREE, THYROIDAB in the last 72 hours. Anemia Panel: No results for input(s): VITAMINB12,  FOLATE, FERRITIN, TIBC, IRON, RETICCTPCT in the last 72 hours. Urine analysis:    Component Value Date/Time   COLORURINE YELLOW 02/22/2018 1240   APPEARANCEUR CLEAR 02/22/2018 1240   LABSPEC 1.014 02/22/2018 1240   PHURINE 6.5 02/22/2018 1240   GLUCOSEU NEGATIVE 02/22/2018 1240   HGBUR 3+ (A) 02/22/2018 1240   BILIRUBINUR NEGATIVE 01/28/2018 0420   KETONESUR NEGATIVE 02/22/2018 1240   PROTEINUR 1+ (A) 02/22/2018 1240   NITRITE NEGATIVE 01/28/2018 0420   LEUKOCYTESUR NEGATIVE 01/28/2018 0420   Sepsis Labs: No results found for this or any previous visit (from the past 240 hour(s)).   Radiological Exams on Admission: Dg Chest Portable 1 View  Result Date: 03/24/2018 CLINICAL DATA:  Shortness of breath. Respiratory distress. Found unresponsive. Improved respiration and responsiveness with repositioning. EXAM: PORTABLE CHEST 1 VIEW COMPARISON:  01/31/2018 FINDINGS: Mild cardiac enlargement. No vascular congestion or edema. Emphysematous changes in the lungs. Probable small bilateral pleural effusions. No pneumothorax. Mediastinal contours appear intact. Calcification of the aorta. Degenerative changes in the spine and shoulders. Old bilateral rib fractures. IMPRESSION: Mild cardiac enlargement. No vascular congestion or edema. Small bilateral pleural effusions. Emphysematous changes in the lungs. Aortic atherosclerosis. Electronically Signed   By: Lucienne Capers M.D.   On: 03/24/2018 22:34    EKG: Independently reviewed.  Sinus tachycardia 108 bpm QTc 436  Assessment/Plan Respiratory failure with hypoxia and hypercapnia, COPD exacerbation: Acute on chronic.  Patient presents in respiratory distress.  Initial ABG reveals respiratory acidosis with hypercapnia PCO2 107. - Admit to stepdown bed - Continuous pulse oximetry  - Bipap prn  - Neurochecks   - DuoNeb's 4 times daily and as needed - Brovana and budesonide nebs - Consider rechecking ABG in a.m. - Discussed goals of care with the  patient's family and they want to keep the patient comfortable  Peripheral edema/bilateral pleural effusion/possible CHF: Acute on chronic.  Patient with positive lower extremity edema noted.  Chest x-ray showing mild cardiac enlargement with small bilateral pleural effusion.  BNP was noted to be 100.6. - Give Lasix 10 mg IV x1 dose, may warrant further IV diuresis - Check daily weight  Dementia with behavioral disturbance - Low-dose Ativan IV prn anxiety/agitation overnight - Apply mittens    Anemia of chronic disease: Hemoglobin 9.7 on admission which appears slightly improved from previous discharge. - Recheck CBC in  a.m  Diabetes mellitus type 2 - Hypoglycemic protocols - CBGs every 4 hours with sensitive SSI   Dysphagia: Question if secondary to progressive dementia versus anatomical abnormality. GI prophylaxis: Pepcid IV DVT prophylaxis: lovenox Code Status: DNR/DNI Family Communication: Discussed plan of care with the patient and family present at bedside Disposition Plan: To be determined Consults called: none Admission status: Inpatient  Norval Morton MD [facility] Pager [phone]   If 7PM-7AM, please contact night-coverage [email or web address] Password [facility]  03/24/2018, 11:24 PM

## 2018-03-24 NOTE — ED Provider Notes (Signed)
[facility] EMERGENCY DEPARTMENT Provider Note   CSN: [id number] Arrival date & time: 03/24/18  2155     History   Chief Complaint Chief Complaint  Patient presents with  . Respiratory Distress    HPI Samantha Zamora is a 84 y.o. female presenting for evaluation of respiratory distress.  Level 5 caveat, patient with dementia unresponsive at this time.  Per EMS, patient was found bradypneic ~4 breaths/min, and was very altered.  With airway positioning and breathing treatment, patient breathing improved.  Patient given Solu-Medrol en route.  Family denies recent fevers, cough, shortness of breath, or precipitating event.  At baseline, patient talks, but is nonambulatory.  Patient on 3 L at home at baseline, and is supposed to use BiPAP at night, but does not tolerate it very well.  She is a hospice patient. Used to follow with Dr. Einar Gip from [facility], but does not any longer now that she's in hospice.  HPI  Past Medical History:  Diagnosis Date  . Allergic rhinitis   . COPD (chronic obstructive pulmonary disease) ([place])    O2 dependent  . Dementia    early stage  . DM (diabetes mellitus) ([place])   . Eczema   . GERD (gastroesophageal reflux disease)   . HTN (hypertension), benign   . Hx of colon cancer, stage I   . Osteoporosis     Patient Active Problem List   Diagnosis Date Noted  . Acute on chronic respiratory failure with hypoxia and hypercapnia ([place]) 03/25/2018  . Acute respiratory failure with hypoxia and hypercapnia ([place]) 02/14/2018  . Chronic anemia 02/10/2018  . Mood disorder ([place]) 02/10/2018  . Chronic respiratory failure with hypoxia ([place]) 02/01/2018  . AKI (acute kidney injury) ([place]) 02/01/2018  . Thrombocytopenia ([place]) 02/01/2018  . DM type 2 (diabetes mellitus, type 2) ([place]) 02/01/2018  . Hematuria 02/01/2018  . Hypoxia   . Dementia 03/31/2017  . Anxiety 03/31/2017  . Depression 03/31/2017  . Chronic rhinitis 03/24/2017  . O2 dependent  03/24/2017  . COPD (chronic obstructive pulmonary disease) ([place]) 03/20/2017    Past Surgical History:  Procedure Laterality Date  . ABDOMINAL HYSTERECTOMY  1975  . COLON SURGERY  01/2010  . UMBILICAL HERNIA REPAIR  01/2010     OB History   None      Home Medications    Prior to Admission medications   Medication Sig Start Date End Date Taking? Authorizing Provider  acetaminophen (TYLENOL) 325 MG tablet Take 2 tablets (650 mg total) by mouth every 6 (six) hours as needed for mild pain (or Fever >/= 101). 02/02/18  Yes Cherene Altes, MD  aspirin EC 81 MG tablet Take 81 mg by mouth daily.   Yes [provider]  blood glucose meter kit and supplies KIT Dispense based on patient and insurance preference. Use once daily as directed. ICD 10: R73.9 02/21/18  Yes Nche, [name], NP  busPIRone (BUSPAR) 15 MG tablet Take 5 mg by mouth daily.    Yes [provider]  docusate sodium (COLACE) 100 MG capsule Take 1 capsule (100 mg total) by mouth 2 (two) times daily. Patient taking differently: Take 100 mg by mouth daily as needed for mild constipation.  02/21/18  Yes Nche, [name], NP  ipratropium-albuterol (DUONEB) 0.5-2.5 (3) MG/3ML SOLN Take 3 mLs by nebulization every 6 (six) hours as needed. Patient taking differently: Take 3 mLs by nebulization every 6 (six) hours as needed.  01/19/18  Yes Annita Brod, MD  LORazepam (ATIVAN) 1 MG tablet Take 0.5 mg by mouth every 4 (four) hours as needed for anxiety.   Yes [provider]  mirtazapine (REMERON) 15 MG tablet Take 0.5 tablets (7.5 mg total) by mouth at bedtime. Patient taking differently: Take 15 mg by mouth at bedtime.  02/21/18  Yes Nche, [name], NP  ondansetron (ZOFRAN) 4 MG tablet Take 4 mg by mouth every 8 (eight) hours as needed for nausea or vomiting.   Yes [provider]  ondansetron (ZOFRAN-ODT) 4 MG disintegrating tablet TK 1 T PO Q 8 H PRN N/V 02/16/18  Yes [provider]  polyethylene glycol powder (GLYCOLAX/MIRALAX) powder Take 17 g by mouth daily. Patient taking differently: Take 17 g by mouth daily as needed for mild constipation.  02/21/18  Yes Nche, [name], NP  QUEtiapine (SEROQUEL) 25 MG tablet Take 1 tablet (25 mg total) by mouth every morning. Patient taking differently: Take 50 mg by mouth 2 (two) times daily.  02/21/18  Yes Nche, [name], NP    Family History Family History  Problem Relation Age of Onset  . Aneurysm Mother     Social History Social History   Tobacco Use  . Smoking status: Former Smoker    Packs/day: 2.00    Years: 30.00    Pack years: 60.00  . Smokeless tobacco: Never Used  . Tobacco comment: quit smoking over 50 years ago  Substance Use Topics  . Alcohol use: No  . Drug use: No     Allergies   Ibuprofen   Review of Systems Review of Systems  Respiratory: Positive for shortness of breath.   All other systems reviewed and are negative.    Physical Exam Updated Vital Signs BP (!) 142/60   Pulse 86   Temp 98.1 F (36.7 C) (Rectal)   Resp 17   Ht '5\' 3"'$  (1.6 m)   Wt 48.1 kg (106 lb)   SpO2 94%   BMI 18.78 kg/m   Physical Exam  Constitutional:  Patient appears acutely and chronically ill. Elderly F  HENT:  Head: Normocephalic and atraumatic.  Eyes: Pupils are equal, round, and reactive to light. Conjunctivae and EOM are normal.  Neck: Normal range of motion. Neck supple.  Cardiovascular: Regular rhythm and intact distal pulses. Tachycardia present.  Tachycardic ~115  Pulmonary/Chest: Accessory muscle usage present. Tachypnea noted. She is in respiratory distress. She has wheezes. She has rhonchi.  No signs of respiratory distress or increased muscle use.  Maintaining airway.  Scattered wheezing with intermittent ronchi.  Abdominal: Soft. She exhibits no distension. There is no tenderness.  Musculoskeletal: Normal range of motion. She exhibits edema.  2+ bilateral pitting  edema.  Pedal pulses intact bilaterally.  Radial and pulses equal bilaterally.    Neurological:  Will respond to name, but is not conversing.  Skin: Skin is warm and dry.  Nursing note and vitals reviewed.    ED Treatments / Results  Labs (all labs ordered are listed, but only abnormal results are displayed) Labs Reviewed  CBC - Abnormal; Notable for the following components:      Result Value   RBC 3.48 (*)    Hemoglobin 9.7 (*)    HCT 33.6 (*)    MCHC 28.9 (*)    All other components within normal limits  COMPREHENSIVE METABOLIC PANEL - Abnormal; Notable for the following components:   Chloride 92 (*)    CO2 42 (*)    Glucose, Bld 175 (*)  Total Protein 5.5 (*)    Albumin 3.2 (*)    ALT 12 (*)    All other components within normal limits  BLOOD GAS, ARTERIAL - Abnormal; Notable for the following components:   pH, Arterial 7.244 (*)    pCO2 arterial 107 (*)    pO2, Arterial 82.7 (*)    Bicarbonate 44.5 (*)    Acid-Base Excess 16.7 (*)    All other components within normal limits  BRAIN NATRIURETIC PEPTIDE - Abnormal; Notable for the following components:   B Natriuretic Peptide 100.6 (*)    All other components within normal limits  CBG MONITORING, ED - Abnormal; Notable for the following components:   Glucose-Capillary 156 (*)    All other components within normal limits  CBC  BASIC METABOLIC PANEL  I-STAT ARTERIAL BLOOD GAS, ED    EKG EKG Interpretation  Date/Time:  Saturday March 24 2018 21:57:57 EDT Ventricular Rate:  108 PR Interval:    QRS Duration: 71 QT Interval:  325 QTC Calculation: 436 R Axis:   78 Text Interpretation:  Sinus tachycardia Atrial premature complex Nonspecific T abnormalities, lateral leads No significant change since last tracing Confirmed by Orlie Dakin [phone]) on 03/24/2018 10:21:40 PM   Radiology Dg Chest Portable 1 View  Result Date: 03/24/2018 CLINICAL DATA:  Shortness of breath. Respiratory distress. Found  unresponsive. Improved respiration and responsiveness with repositioning. EXAM: PORTABLE CHEST 1 VIEW COMPARISON:  01/31/2018 FINDINGS: Mild cardiac enlargement. No vascular congestion or edema. Emphysematous changes in the lungs. Probable small bilateral pleural effusions. No pneumothorax. Mediastinal contours appear intact. Calcification of the aorta. Degenerative changes in the spine and shoulders. Old bilateral rib fractures. IMPRESSION: Mild cardiac enlargement. No vascular congestion or edema. Small bilateral pleural effusions. Emphysematous changes in the lungs. Aortic atherosclerosis. Electronically Signed   By: Lucienne Capers M.D.   On: 03/24/2018 22:34    Procedures .Critical Care Performed by: Franchot Heidelberg, PA-C Authorized by: Franchot Heidelberg, PA-C   Critical care provider statement:    Critical care time (minutes):  40   Critical care time was exclusive of:  Separately billable procedures and treating other patients and teaching time   Critical care was necessary to treat or prevent imminent or life-threatening deterioration of the following conditions:  Respiratory failure   Critical care was time spent personally by me on the following activities:  Blood draw for specimens, discussions with consultants, development of treatment plan with patient or surrogate, evaluation of patient's response to treatment, examination of patient, obtaining history from patient or surrogate, ordering and performing treatments and interventions, ordering and review of radiographic studies, ordering and review of laboratory studies, pulse oximetry, re-evaluation of patient's condition and review of old charts   I assumed direction of critical care for this patient from another provider in my specialty: no   Comments:     Pt presenting in respiratory distress. bipap started. Pt improved   (including critical care time)  Medications Ordered in ED Medications  LORazepam (ATIVAN) injection 0.25 mg  (has no administration in time range)  ipratropium-albuterol (DUONEB) 0.5-2.5 (3) MG/3ML nebulizer solution 3 mL (has no administration in time range)  methylPREDNISolone sodium succinate (SOLU-MEDROL) 125 mg/2 mL injection 60 mg (has no administration in time range)  ondansetron (ZOFRAN) tablet 4 mg (has no administration in time range)    Or  ondansetron (ZOFRAN) injection 4 mg (has no administration in time range)  acetaminophen (TYLENOL) tablet 650 mg (has no administration in time range)  Or  acetaminophen (TYLENOL) suppository 650 mg (has no administration in time range)  ipratropium-albuterol (DUONEB) 0.5-2.5 (3) MG/3ML nebulizer solution 3 mL (has no administration in time range)  budesonide (PULMICORT) nebulizer solution 0.5 mg (has no administration in time range)  arformoterol (BROVANA) nebulizer solution 15 mcg (has no administration in time range)  enoxaparin (LOVENOX) injection 40 mg (has no administration in time range)  famotidine (PEPCID) IVPB 20 mg premix (has no administration in time range)  furosemide (LASIX) injection 10 mg (has no administration in time range)  insulin aspart (novoLOG) injection 0-9 Units (has no administration in time range)     Initial Impression / Assessment and Plan / ED Course  I have reviewed the triage vital signs and the nursing notes.  Pertinent labs & imaging results that were available during my care of the patient were reviewed by me and considered in my medical decision making (see chart for details).     Patient presenting for evaluation of shortness of breath and respiratory distress.  Physical exam shows elderly ill-appearing female breathing easily on oxygen.  She will respond to name, but will not converse.  Pulmonary exam with wheezing and rhonchi.  Bilateral pitting edema. ?COPD exacerbation versus CHF exacerbation versus pneumonia.  Will obtain labs, chest x-ray, and EKG.  As symptoms improved with albuterol and steroids in  route with EMS, doubt ACS or PE.  Case discussed with attending, Dr. Winfred Leeds evaluated the patient.  ABG shows elevated PCO2 over 100.  Sats remained stable, will start BiPAP.  Labs reassuring, no leukocytosis, hemoglobin stable.  Chest x-ray reviewed and interpreted by me, no infection, shows bilateral pleural effusion.  EKG without sign of STEMI, unchanged from prior.  Will call to admit for COPD exacerbation.  Discussed with Dr. Tamala Julian from [facility] hospitalist service, patient be admitted.   Final Clinical Impressions(s) / ED Diagnoses   Final diagnoses:  COPD exacerbation ([employer])  Bilateral pleural effusion    ED Discharge Orders    None       Franchot Heidelberg, PA-C 03/25/18 0047    Orlie Dakin, MD 03/26/18 [id number]

## 2018-03-25 ENCOUNTER — Other Ambulatory Visit: Payer: Self-pay

## 2018-03-25 ENCOUNTER — Encounter (HOSPITAL_COMMUNITY): Payer: Self-pay

## 2018-03-25 DIAGNOSIS — K219 Gastro-esophageal reflux disease without esophagitis: Secondary | ICD-10-CM | POA: Diagnosis present

## 2018-03-25 DIAGNOSIS — R4182 Altered mental status, unspecified: Secondary | ICD-10-CM | POA: Diagnosis not present

## 2018-03-25 DIAGNOSIS — F39 Unspecified mood [affective] disorder: Secondary | ICD-10-CM | POA: Diagnosis not present

## 2018-03-25 DIAGNOSIS — Z886 Allergy status to analgesic agent status: Secondary | ICD-10-CM | POA: Diagnosis not present

## 2018-03-25 DIAGNOSIS — J9600 Acute respiratory failure, unspecified whether with hypoxia or hypercapnia: Secondary | ICD-10-CM | POA: Diagnosis not present

## 2018-03-25 DIAGNOSIS — Z9981 Dependence on supplemental oxygen: Secondary | ICD-10-CM | POA: Diagnosis not present

## 2018-03-25 DIAGNOSIS — F0391 Unspecified dementia with behavioral disturbance: Secondary | ICD-10-CM | POA: Diagnosis present

## 2018-03-25 DIAGNOSIS — I4891 Unspecified atrial fibrillation: Secondary | ICD-10-CM | POA: Diagnosis not present

## 2018-03-25 DIAGNOSIS — D649 Anemia, unspecified: Secondary | ICD-10-CM | POA: Diagnosis not present

## 2018-03-25 DIAGNOSIS — Z515 Encounter for palliative care: Secondary | ICD-10-CM | POA: Diagnosis present

## 2018-03-25 DIAGNOSIS — J9622 Acute and chronic respiratory failure with hypercapnia: Secondary | ICD-10-CM | POA: Diagnosis present

## 2018-03-25 DIAGNOSIS — M810 Age-related osteoporosis without current pathological fracture: Secondary | ICD-10-CM | POA: Diagnosis present

## 2018-03-25 DIAGNOSIS — J441 Chronic obstructive pulmonary disease with (acute) exacerbation: Secondary | ICD-10-CM | POA: Diagnosis present

## 2018-03-25 DIAGNOSIS — J9621 Acute and chronic respiratory failure with hypoxia: Secondary | ICD-10-CM | POA: Diagnosis present

## 2018-03-25 DIAGNOSIS — R609 Edema, unspecified: Secondary | ICD-10-CM | POA: Diagnosis present

## 2018-03-25 DIAGNOSIS — J9601 Acute respiratory failure with hypoxia: Secondary | ICD-10-CM | POA: Diagnosis not present

## 2018-03-25 DIAGNOSIS — Z79899 Other long term (current) drug therapy: Secondary | ICD-10-CM | POA: Diagnosis not present

## 2018-03-25 DIAGNOSIS — Z85038 Personal history of other malignant neoplasm of large intestine: Secondary | ICD-10-CM | POA: Diagnosis not present

## 2018-03-25 DIAGNOSIS — R600 Localized edema: Secondary | ICD-10-CM | POA: Diagnosis present

## 2018-03-25 DIAGNOSIS — F0281 Dementia in other diseases classified elsewhere with behavioral disturbance: Secondary | ICD-10-CM | POA: Diagnosis not present

## 2018-03-25 DIAGNOSIS — J90 Pleural effusion, not elsewhere classified: Secondary | ICD-10-CM | POA: Diagnosis present

## 2018-03-25 DIAGNOSIS — D638 Anemia in other chronic diseases classified elsewhere: Secondary | ICD-10-CM | POA: Diagnosis present

## 2018-03-25 DIAGNOSIS — R1310 Dysphagia, unspecified: Secondary | ICD-10-CM | POA: Diagnosis present

## 2018-03-25 DIAGNOSIS — R0603 Acute respiratory distress: Secondary | ICD-10-CM | POA: Diagnosis not present

## 2018-03-25 DIAGNOSIS — Z87891 Personal history of nicotine dependence: Secondary | ICD-10-CM | POA: Diagnosis not present

## 2018-03-25 DIAGNOSIS — F419 Anxiety disorder, unspecified: Secondary | ICD-10-CM | POA: Diagnosis not present

## 2018-03-25 DIAGNOSIS — Z7982 Long term (current) use of aspirin: Secondary | ICD-10-CM | POA: Diagnosis not present

## 2018-03-25 DIAGNOSIS — E119 Type 2 diabetes mellitus without complications: Secondary | ICD-10-CM | POA: Diagnosis present

## 2018-03-25 DIAGNOSIS — I10 Essential (primary) hypertension: Secondary | ICD-10-CM | POA: Diagnosis present

## 2018-03-25 DIAGNOSIS — Z90710 Acquired absence of both cervix and uterus: Secondary | ICD-10-CM | POA: Diagnosis not present

## 2018-03-25 DIAGNOSIS — E872 Acidosis: Secondary | ICD-10-CM | POA: Diagnosis present

## 2018-03-25 DIAGNOSIS — J449 Chronic obstructive pulmonary disease, unspecified: Secondary | ICD-10-CM | POA: Diagnosis not present

## 2018-03-25 DIAGNOSIS — Z66 Do not resuscitate: Secondary | ICD-10-CM | POA: Diagnosis present

## 2018-03-25 LAB — GLUCOSE, CAPILLARY
GLUCOSE-CAPILLARY: 117 mg/dL — AB (ref 65–99)
GLUCOSE-CAPILLARY: 124 mg/dL — AB (ref 65–99)
GLUCOSE-CAPILLARY: 92 mg/dL (ref 65–99)
Glucose-Capillary: 112 mg/dL — ABNORMAL HIGH (ref 65–99)

## 2018-03-25 LAB — BASIC METABOLIC PANEL
ANION GAP: 3 — AB (ref 5–15)
BUN: 14 mg/dL (ref 6–20)
CHLORIDE: 95 mmol/L — AB (ref 101–111)
CO2: 44 mmol/L — ABNORMAL HIGH (ref 22–32)
Calcium: 9.5 mg/dL (ref 8.9–10.3)
Creatinine, Ser: 0.76 mg/dL (ref 0.44–1.00)
GFR calc non Af Amer: >60 mL/min (ref >60)
Glucose, Bld: 156 mg/dL — ABNORMAL HIGH (ref 65–99)
POTASSIUM: 4.2 mmol/L (ref 3.5–5.1)
SODIUM: 142 mmol/L (ref 135–145)

## 2018-03-25 LAB — CBC
HCT: 33.1 % — ABNORMAL LOW (ref 36.0–46.0)
HEMOGLOBIN: 9.4 g/dL — AB (ref 12.0–15.0)
MCH: 27.3 pg (ref 26.0–34.0)
MCHC: 28.4 g/dL — ABNORMAL LOW (ref 30.0–36.0)
MCV: 96.2 fL (ref 78.0–100.0)
Platelets: 153 10*3/uL (ref 150–400)
RBC: 3.44 MIL/uL — AB (ref 3.87–5.11)
RDW: 13.7 % (ref 11.5–15.5)
WBC: 5.5 10*3/uL (ref 4.0–10.5)

## 2018-03-25 LAB — CBG MONITORING, ED
GLUCOSE-CAPILLARY: 162 mg/dL — AB (ref 65–99)
GLUCOSE-CAPILLARY: 134 mg/dL — AB (ref 65–99)

## 2018-03-25 LAB — BRAIN NATRIURETIC PEPTIDE: B Natriuretic Peptide: 100.6 pg/mL — ABNORMAL HIGH (ref 0.0–100.0)

## 2018-03-25 LAB — MRSA PCR SCREENING: MRSA by PCR: NEGATIVE

## 2018-03-25 MED ORDER — FAMOTIDINE IN NACL 20-0.9 MG/50ML-% IV SOLN
20.0000 mg | Freq: Two times a day (BID) | INTRAVENOUS | Status: DC
  Administered 2018-03-25 – 2018-03-26 (×4): 20 mg via INTRAVENOUS
  Filled 2018-03-25 (×4): qty 50

## 2018-03-25 MED ORDER — FUROSEMIDE 10 MG/ML IJ SOLN
10.0000 mg | Freq: Once | INTRAMUSCULAR | Status: AC
  Administered 2018-03-25: 10 mg via INTRAVENOUS
  Filled 2018-03-25: qty 2

## 2018-03-25 MED ORDER — BUDESONIDE 0.5 MG/2ML IN SUSP
0.5000 mg | Freq: Two times a day (BID) | RESPIRATORY_TRACT | Status: DC
  Administered 2018-03-25 – 2018-03-27 (×4): 0.5 mg via RESPIRATORY_TRACT
  Filled 2018-03-25 (×5): qty 2

## 2018-03-25 MED ORDER — LORAZEPAM 2 MG/ML IJ SOLN
0.2500 mg | INTRAMUSCULAR | Status: DC | PRN
  Administered 2018-03-25: 0.25 mg via INTRAVENOUS
  Filled 2018-03-25: qty 1

## 2018-03-25 MED ORDER — INSULIN ASPART 100 UNIT/ML ~~LOC~~ SOLN
0.0000 [IU] | SUBCUTANEOUS | Status: DC
  Administered 2018-03-25: 2 [IU] via SUBCUTANEOUS
  Administered 2018-03-25 – 2018-03-26 (×3): 1 [IU] via SUBCUTANEOUS
  Filled 2018-03-25 (×2): qty 1

## 2018-03-25 MED ORDER — ACETAMINOPHEN 650 MG RE SUPP: 650.0000 mg | Freq: Four times a day (QID) | RECTAL | Status: DC | PRN

## 2018-03-25 MED ORDER — ARFORMOTEROL TARTRATE 15 MCG/2ML IN NEBU
15.0000 ug | INHALATION_SOLUTION | Freq: Two times a day (BID) | RESPIRATORY_TRACT | Status: DC
  Administered 2018-03-25 – 2018-03-26 (×3): 15 ug via RESPIRATORY_TRACT
  Filled 2018-03-25 (×3): qty 2

## 2018-03-25 MED ORDER — ACETAMINOPHEN 325 MG PO TABS: 650.0000 mg | ORAL_TABLET | Freq: Four times a day (QID) | ORAL | Status: DC | PRN

## 2018-03-25 MED ORDER — IPRATROPIUM-ALBUTEROL 0.5-2.5 (3) MG/3ML IN SOLN: 3.0000 mL | RESPIRATORY_TRACT | Status: DC | PRN

## 2018-03-25 MED ORDER — MIRTAZAPINE 15 MG PO TABS
15.0000 mg | ORAL_TABLET | Freq: Every day | ORAL | Status: DC
  Administered 2018-03-25 – 2018-03-26 (×2): 15 mg via ORAL
  Filled 2018-03-25 (×2): qty 1

## 2018-03-25 MED ORDER — ONDANSETRON HCL 4 MG PO TABS
4.0000 mg | ORAL_TABLET | Freq: Four times a day (QID) | ORAL | Status: DC | PRN
  Filled 2018-03-25: qty 1

## 2018-03-25 MED ORDER — METHYLPREDNISOLONE SODIUM SUCC 125 MG IJ SOLR
60.0000 mg | Freq: Three times a day (TID) | INTRAMUSCULAR | Status: DC
  Administered 2018-03-25 (×3): 60 mg via INTRAVENOUS
  Filled 2018-03-25 (×3): qty 2

## 2018-03-25 MED ORDER — METHYLPREDNISOLONE SODIUM SUCC 125 MG IJ SOLR
60.0000 mg | Freq: Two times a day (BID) | INTRAMUSCULAR | Status: DC
  Administered 2018-03-26: 60 mg via INTRAVENOUS
  Filled 2018-03-25: qty 2

## 2018-03-25 MED ORDER — QUETIAPINE FUMARATE 25 MG PO TABS
50.0000 mg | ORAL_TABLET | Freq: Every day | ORAL | Status: DC
  Administered 2018-03-25 – 2018-03-26 (×2): 50 mg via ORAL
  Filled 2018-03-25 (×2): qty 2

## 2018-03-25 MED ORDER — IPRATROPIUM-ALBUTEROL 0.5-2.5 (3) MG/3ML IN SOLN
3.0000 mL | Freq: Four times a day (QID) | RESPIRATORY_TRACT | Status: DC
  Administered 2018-03-25 – 2018-03-27 (×10): 3 mL via RESPIRATORY_TRACT
  Filled 2018-03-25 (×12): qty 3

## 2018-03-25 MED ORDER — LORAZEPAM 2 MG/ML IJ SOLN
0.2500 mg | INTRAMUSCULAR | Status: DC | PRN
  Administered 2018-03-25 – 2018-03-26 (×2): 0.5 mg via INTRAVENOUS
  Filled 2018-03-25 (×2): qty 1

## 2018-03-25 MED ORDER — ENOXAPARIN SODIUM 40 MG/0.4ML ~~LOC~~ SOLN
40.0000 mg | Freq: Every day | SUBCUTANEOUS | Status: DC
  Administered 2018-03-25 – 2018-03-27 (×3): 40 mg via SUBCUTANEOUS
  Filled 2018-03-25 (×3): qty 0.4

## 2018-03-25 MED ORDER — ONDANSETRON HCL 4 MG/2ML IJ SOLN: 4.0000 mg | Freq: Four times a day (QID) | INTRAMUSCULAR | Status: DC | PRN

## 2018-03-25 NOTE — Progress Notes (Signed)
Patient disconnected herself from the bipap. RT took bipap mask off and put patient on a 3L [place]. Patient seems to be tolerating well at this time. RT will monitor patient.

## 2018-03-25 NOTE — ED Triage Notes (Signed)
PT found to be sitting on end of bed. Pt assisted up into bed and RT at bed side to assist with O2 sts . Neb treatment given by RT.

## 2018-03-25 NOTE — Progress Notes (Signed)
[facility] TEAM 1 - Stepdown/ICU TEAM  [place]  [id number] DOB: 26-Mar-1924 DOA: 03/24/2018 PCP: Flossie Buffy, NP    Brief Narrative:  84yo F w/ a hx of End Stage COPD (on 3 L), HTN, DM2, dementia, and osteoporosis on Hospice care who presented with acute respiratory distress after she became less responsive and slumped over with increased work of breathing.  Due to her dementia w/ agitation she had been placed on Ativan prn and an increased dose of Seroquel.  It was also reported that she does not consistently use her QHS BIPAP.    Upon arrival in the ED the patient was noted to be in significant respiratory distress and was placed on BiPAP.  Initial ABG noted pH 7.244, PCO2 107, PaO2 83.  Significant Events: 4/27 admit   Subjective: The pt is resting comfortably in her room.  She is not in resp distress, and there is no evidence of uncontrolled pain.    Assessment & Plan:  End Stage COPD - Acute on chronic hypoxic and hypercarbic resp failure  Acute flair appears to be settling down at this time - cont supportive care  Peripheral edema / bilateral pleural effusion / possible CHF No TTE on record - minimal benefit to be gained by obtaining one at this time - hold on further diuresis for now and follow  Dementia with behavioral disturbance Continue usual meds as necessary to ameliorate her agitation   Anemia of chronic disease Hgb is stable   DM2 CBG well controlled   HTN Reasonably controlled at this time   DVT prophylaxis: lovenox  Code Status: DNR - NO CODE Family Communication: no family present at time of exam  Disposition Plan:   Consultants:  none  Antimicrobials:  none   Objective: Blood pressure (!) 152/68, pulse 83, temperature 98.1 F (36.7 C), temperature source Rectal, resp. rate 18, height 5\' 3"  (1.6 m), weight 48.1 kg (106 lb), SpO2 98 %.  Intake/Output Summary (Last 24 hours) at 03/25/2018 0840 Last data filed at 03/25/2018  0313 Gross per 24 hour  Intake 50 ml  Output -  Net 50 ml   Filed Weights   03/24/18 2200  Weight: 48.1 kg (106 lb)    Examination: General: No acute respiratory distress Lungs: No active wheezing  Cardiovascular: Regular rate and rhythm  Extremities: No signif edema B LE   CBC: Recent Labs  Lab 03/24/18 2207 03/25/18 0317  WBC 6.9 5.5  HGB 9.7* 9.4*  HCT 33.6* 33.1*  MCV 96.6 96.2  PLT 152 702   Basic Metabolic Panel: Recent Labs  Lab 03/24/18 2207 03/25/18 0317  NA 140 142  K 4.2 4.2  CL 92* 95*  CO2 42* 44*  GLUCOSE 175* 156*  BUN 13 14  CREATININE 0.74 0.76  CALCIUM 9.5 9.5   GFR: Estimated Creatinine Clearance: 34.8 mL/min (by C-G formula based on SCr of 0.76 mg/dL).  Liver Function Tests: Recent Labs  Lab 03/24/18 2207  AST 20  ALT 12*  ALKPHOS 62  BILITOT 0.8  PROT 5.5*  ALBUMIN 3.2*    HbA1C: Hgb A1c MFr Bld  Date/Time Value Ref Range Status  02/21/2018 12:43 PM 6.3 4.6 - 6.5 % Final    Comment:    Glycemic Control Guidelines for People with Diabetes:Non Diabetic:  <6%Goal of Therapy: <7%Additional Action Suggested:  >8%     CBG: Recent Labs  Lab 03/24/18 2206 03/25/18 0130 03/25/18 0423  GLUCAP 156* 162* 134*    Scheduled  Meds: . arformoterol  15 mcg Nebulization BID  . budesonide (PULMICORT) nebulizer solution  0.5 mg Nebulization BID  . enoxaparin (LOVENOX) injection  40 mg Subcutaneous Daily  . insulin aspart  0-9 Units Subcutaneous Q4H  . ipratropium-albuterol  3 mL Nebulization QID  . methylPREDNISolone (SOLU-MEDROL) injection  60 mg Intravenous Q8H     LOS: 0 days   Cherene Altes, MD [facility] Office  [phone] Pager - Text Page per Amion as per below:  On-Call/Text Page:      [email or web address]      password [email or web address]  If 7PM-7AM, please contact night-coverage [email or web address] Password [email or web address] 03/25/2018, 8:40 AM

## 2018-03-25 NOTE — Progress Notes (Signed)
Patient is currently on 4LNC with sats of 100% and is in no distress. BIPAP is in room on standby but is not needed at this time. Will continue to monitor.

## 2018-03-25 NOTE — ED Triage Notes (Signed)
PT restless ,PRN given for restless.

## 2018-03-25 NOTE — ED Notes (Signed)
Bladder scan- 583 mL   MD paged.

## 2018-03-25 NOTE — ED Notes (Signed)
Checked CBG 162, RN Seth Bake informed

## 2018-03-26 DIAGNOSIS — L8990 Pressure ulcer of unspecified site, unspecified stage: Secondary | ICD-10-CM | POA: Diagnosis present

## 2018-03-26 LAB — GLUCOSE, CAPILLARY
GLUCOSE-CAPILLARY: 103 mg/dL — AB (ref 65–99)
Glucose-Capillary: 115 mg/dL — ABNORMAL HIGH (ref 65–99)
Glucose-Capillary: 137 mg/dL — ABNORMAL HIGH (ref 65–99)
Glucose-Capillary: 95 mg/dL (ref 65–99)

## 2018-03-26 MED ORDER — LORAZEPAM 0.5 MG PO TABS: 0.5000 mg | ORAL_TABLET | ORAL | Status: DC | PRN

## 2018-03-26 MED ORDER — ALBUTEROL SULFATE (2.5 MG/3ML) 0.083% IN NEBU: 2.5000 mg | INHALATION_SOLUTION | RESPIRATORY_TRACT | Status: DC | PRN

## 2018-03-26 MED ORDER — BUSPIRONE HCL 5 MG PO TABS
5.0000 mg | ORAL_TABLET | Freq: Every day | ORAL | Status: DC
  Administered 2018-03-26 – 2018-03-27 (×2): 5 mg via ORAL
  Filled 2018-03-26 (×2): qty 1

## 2018-03-26 MED ORDER — LORAZEPAM 2 MG/ML IJ SOLN
0.2500 mg | INTRAMUSCULAR | Status: DC | PRN
  Administered 2018-03-26: 0.5 mg via INTRAVENOUS
  Filled 2018-03-26: qty 1

## 2018-03-26 MED ORDER — METHYLPREDNISOLONE SODIUM SUCC 125 MG IJ SOLR
60.0000 mg | Freq: Two times a day (BID) | INTRAMUSCULAR | Status: AC
  Administered 2018-03-26: 60 mg via INTRAVENOUS
  Filled 2018-03-26: qty 2

## 2018-03-26 NOTE — Progress Notes (Signed)
[facility] TEAM 1 - Stepdown/ICU TEAM  [place]  [id number]:[id number] DOB: Jan 14, 1924 DOA: 03/24/2018 PCP: Flossie Buffy, NP    Brief Narrative:  84yo F w/ a hx of End Stage COPD (on 3 L), HTN, DM2, dementia, and osteoporosis on Hospice care who presented with acute respiratory distress after she became less responsive and slumped over with increased work of breathing.  Due to her dementia w/ agitation she had been placed on Ativan prn and an increased dose of Seroquel.  It was also reported that she does not consistently use her QHS BIPAP.    Upon arrival in the ED the patient was noted to be in significant respiratory distress and was placed on BiPAP.  Initial ABG noted pH 7.244, PCO2 107, PaO2 83.  Significant Events: 4/27 admit   Subjective: The patient is in bed.  She is awake but not interactive.  She does not appear to be in any acute respiratory distress.  She does not appear to be uncomfortable.  There is no family present at the time of visit.  She has been titrated off of BiPAP with sats maintaining within our target range on modest nasal cannula support at this time.  Assessment & Plan:  End Stage COPD - Acute on chronic hypoxic and hypercarbic resp failure  Acute flair appears to be settling down at this time - cont supportive care - may be stable enough to return home w/ hospice care in next 24hrs   Peripheral edema / small bilateral pleural effusion / possible CHF No TTE on record - minimal benefit to be gained by obtaining one at this time - hold on further diuresis for now and follow  - no clinical evidence of signif volume overload at this time   Dementia with behavioral disturbance Continue usual meds as necessary to ameliorate her agitation   Anemia of chronic disease Hgb is stable - no indication for checking labs again   DM2 CBG controlled   HTN Strict monitoring and management of BP not indicated at this time - avoid extremes of BP which could impact  her sx management   DVT prophylaxis: lovenox  Code Status: DNR - NO CODE Family Communication: no family present at time of exam  Disposition Plan: anticipate d/c back home w/ hospice 4/30 if remains stable on [place]   Consultants:  none  Antimicrobials:  none   Objective: Blood pressure (!) 163/90, pulse 70, temperature 98.2 F (36.8 C), resp. rate 20, height 5\' 3"  (1.6 m), weight 47 kg (103 lb 9.9 oz), SpO2 100 %.  Intake/Output Summary (Last 24 hours) at 03/26/2018 1105 Last data filed at 03/26/2018 0012 Gross per 24 hour  Intake -  Output 775 ml  Net -775 ml   Filed Weights   03/24/18 2200 03/25/18 0900 03/26/18 0500  Weight: 48.1 kg (106 lb) 47.5 kg (104 lb 11.5 oz) 47 kg (103 lb 9.9 oz)    Examination: General: No acute respiratory distress evident on [place] only  Lungs: No active wheezing on exam today  Cardiovascular: RRR Extremities: No edema B LE   CBC: Recent Labs  Lab 03/24/18 2207 03/25/18 0317  WBC 6.9 5.5  HGB 9.7* 9.4*  HCT 33.6* 33.1*  MCV 96.6 96.2  PLT 152 035   Basic Metabolic Panel: Recent Labs  Lab 03/24/18 2207 03/25/18 0317  NA 140 142  K 4.2 4.2  CL 92* 95*  CO2 42* 44*  GLUCOSE 175* 156*  BUN 13 14  CREATININE  0.74 0.76  CALCIUM 9.5 9.5   GFR: Estimated Creatinine Clearance: 34 mL/min (by C-G formula based on SCr of 0.76 mg/dL).  Liver Function Tests: Recent Labs  Lab 03/24/18 2207  AST 20  ALT 12*  ALKPHOS 62  BILITOT 0.8  PROT 5.5*  ALBUMIN 3.2*    HbA1C: Hgb A1c MFr Bld  Date/Time Value Ref Range Status  02/21/2018 12:43 PM 6.3 4.6 - 6.5 % Final    Comment:    Glycemic Control Guidelines for People with Diabetes:Non Diabetic:  <6%Goal of Therapy: <7%Additional Action Suggested:  >8%     CBG: Recent Labs  Lab 03/25/18 1729 03/25/18 1928 03/26/18 0018 03/26/18 0423 03/26/18 0856  GLUCAP 92 112* 137* 95 103*    Scheduled Meds: . arformoterol  15 mcg Nebulization BID  . budesonide (PULMICORT) nebulizer  solution  0.5 mg Nebulization BID  . enoxaparin (LOVENOX) injection  40 mg Subcutaneous Daily  . insulin aspart  0-9 Units Subcutaneous Q4H  . ipratropium-albuterol  3 mL Nebulization QID  . methylPREDNISolone (SOLU-MEDROL) injection  60 mg Intravenous Q12H  . mirtazapine  15 mg Oral QHS  . QUEtiapine  50 mg Oral QHS     LOS: 1 day   Cherene Altes, MD [facility] Office  [phone] Pager - Text Page per Amion as per below:  On-Call/Text Page:      [email or web address]      password [email or web address]  If 7PM-7AM, please contact night-coverage [email or web address] Password [email or web address] 03/26/2018, 11:05 AM

## 2018-03-27 MED ORDER — POLYETHYLENE GLYCOL 3350 17 GM/SCOOP PO POWD: 17.0000 g | Freq: Every day | ORAL | Status: AC | PRN

## 2018-03-27 NOTE — Discharge Instructions (Signed)
Hospice Introduction Hospice is a service that is designed to provide people who are terminally ill and their families with medical, spiritual, and psychological support. Its aim is to improve your quality of life by keeping you as alert and comfortable as possible. Who will be my providers when I begin hospice care? Hospice teams often include:  A nurse.  A doctor. The hospice doctor will be available for your care, but you can bring your regular doctor or nurse practitioner.  Social workers.  Religious leaders (such as a Clinical biochemist).  Trained volunteers.  What roles will providers play in my care? Hospice is performed by a team of health care professionals and volunteers who:  Help keep you comfortable: ? Hospice can be provided in your home or in a homelike setting. ? The hospice staff works with your family and friends to help meet your needs. ? You will enjoy the support of loved ones by receiving much of your basic care from family and friends.  Provide pain relief and manage your symptoms. The staff supply all necessary medicines and equipment.  Provide companionship when you are alone.  Allow you and your family to rest. They may do light housekeeping, prepare meals, and run errands.  Provide counseling. They will make sure your emotional, spiritual, and social needs and those of your family are being met.  Provide spiritual care: ? Spiritual care will be individualized to meet your needs and your family's needs. ? Spiritual care may involve:  Helping you look at what death means to you.  Helping you say goodbye to your family and friends.  Performing a specific religious ceremony or ritual.  When should hospice care begin? Most people who use hospice are believed to have fewer than 6 months to live.  Your family and health care providers can help you decide when hospice services should begin.  If your condition improves, you may discontinue the program.  What  should I consider before selecting a program? Most hospice programs are run by nonprofit, independent organizations. Some are affiliated with hospitals, nursing homes, or home health care agencies. Hospice programs can take place in the home or at a hospice center, hospital, or skilled nursing facility. When choosing a hospice program, ask the following questions:  What services are available to me?  What services will be offered to my loved ones?  How involved will my loved ones be?  How involved will my health care provider be?  Who makes up the hospice care team? How are they trained or screened?  How will my pain and symptoms be managed?  If my circumstances change, can the services be provided in a different setting, such as my home or in the hospital?  Is the program reviewed and licensed by the state or certified in some other way?  Where can I learn more about hospice? You can learn about existing hospice programs in your area from your health care providers. You can also read more about hospice online. The websites of the following organizations contain helpful information:  The [facility] and Palliative Care Organization [facility]).  The Hospice Association of America ([place]).  The [place].  The American Cancer Society (ACS).  Hospice Net.  This information is not intended to replace advice given to you by your health care provider. Make sure you discuss any questions you have with your health care provider. Document Released: 03/02/2004 Document Revised: 06/30/2016 Document Reviewed: 09/24/2013 Elsevier Interactive Patient Education  2017 [employer].

## 2018-03-27 NOTE — Care Management Note (Addendum)
Case Management Note  Patient Details  Name: Rhaya Coale MRN: [id number] Date of Birth: Oct 18, 1924  Subjective/Objective:   From home with [facility], resp failure with hypoxia and hypercapnia, she is on 3 liters of oxygen at home, she also has a nebulizer machine at home.  She will need ambulance transport at discharge, NCM has confirmed address. Transport set up for 3:15. Ambulance forms on chart.  Will need DNR form on chart.                   Action/Plan: DC home with resumption of home hospice with [facility].   Expected Discharge Date:  03/27/18               Expected Discharge Plan:  Home w Hospice Care  In-House Referral:     Discharge planning Services  CM Consult  Post Acute Care Choice:  Hospice, Resumption of Svcs/PTA Provider Choice offered to:     DME Arranged:    DME Agency:     HH Arranged:    HH Agency:     Status of Service:  Completed, signed off  If discussed at [employer] of Stay Meetings, dates discussed:    Additional Comments:  Zenon Mayo, RN 03/27/2018, 1:57 PM

## 2018-03-27 NOTE — Progress Notes (Signed)
Patient's family called and notified that patient's is getting ready for transport back to home residence.

## 2018-03-27 NOTE — Discharge Summary (Signed)
DISCHARGE SUMMARY  Samantha Zamora  MR#: [id number]  DOB:02-23-1924  Date of Admission: 03/24/2018 Date of Discharge: 03/27/2018  Attending Physician:Taiana Temkin Hennie Duos, MD   Patient's SWF:UXNA, [name], NP  Consults:  None   Disposition: D/C home w/ hospice care   Follow-up Appts: Follow-up Information    Nche, [name], NP. Schedule an appointment as soon as possible for a visit in 7 day(s).   Specialty:  Internal Medicine Contact information: [address] [address] [phone]           Tests Needing Follow-up: -exercise caution in utilization of sedating medications as they will rapidly lead to worsening hypercarbia  Discharge Diagnoses: End Stage COPD Acute on chronic hypoxic and hypercarbic resp failure  Peripheral edema / small bilateral pleural effusion / possible CHF Dementia with behavioral disturbance Anemia of chronic disease DM2 HTN  Initial presentation: 84yo F w/ a hx of EndStageCOPD (on 3 L), HTN, DM2, dementia, and osteoporosis on Hospice care who presented with acute respiratory distress after she became less responsive and slumped over with increased work of breathing.  Due to her dementia w/ agitation she had been placed on Ativan prn andan increased dose of Seroquel. It was also reported that she does not consistently use her QHS BIPAP.   Upon arrival in the ED the patient was noted to be in significant respiratory distress and was placed on BiPAP.Initial ABG noted pH 7.244,PCO2 107,PaO2 83.  Hospital Course:  End Stage COPD - Acute on chronic hypoxic and hypercarbic resp failure  Acute flair appears to be settling down at this time - cont supportive care - now stable enough to return home w/ hospice care - change O2 dose to 5 LPM at all times   Peripheral edema / small bilateral pleural effusion / possible CHF No TTE on record - minimal benefit to be gained by obtaining one at this time - no clinical  evidence of signif volume overload at the time of d/c   Dementia with behavioral disturbance Continue usual meds as necessary to ameliorate her agitation - exercise caution in utilization of sedating medications as they will rapidly lead to worsening hypercarbia   Anemia of chronic disease Hgb was stable during hospital stay  DM2 CBG controlled   HTN Strict monitoring and management of BP not indicated at this time - avoid extremes of BP which could impact her sx management    Allergies as of 03/27/2018      Reactions   Ibuprofen Other (See Comments)   bleeding      Medication List    STOP taking these medications   aspirin EC 81 MG tablet   blood glucose meter kit and supplies Kit     TAKE these medications   acetaminophen 325 MG tablet Commonly known as:  TYLENOL Take 2 tablets (650 mg total) by mouth every 6 (six) hours as needed for mild pain (or Fever >/= 101).   busPIRone 15 MG tablet Commonly known as:  BUSPAR Take 5 mg by mouth daily.   docusate sodium 100 MG capsule Commonly known as:  COLACE Take 1 capsule (100 mg total) by mouth 2 (two) times daily. What changed:    when to take this  reasons to take this   ipratropium-albuterol 0.5-2.5 (3) MG/3ML Soln Commonly known as:  DUONEB Take 3 mLs by nebulization every 6 (six) hours as needed.   LORazepam 1 MG tablet Commonly known as:  ATIVAN Take 0.5 mg by mouth every  4 (four) hours as needed for anxiety.   mirtazapine 15 MG tablet Commonly known as:  REMERON Take 0.5 tablets (7.5 mg total) by mouth at bedtime. What changed:  how much to take   ondansetron 4 MG disintegrating tablet Commonly known as:  ZOFRAN-ODT TK 1 T PO Q 8 H PRN N/V   ondansetron 4 MG tablet Commonly known as:  ZOFRAN Take 4 mg by mouth every 8 (eight) hours as needed for nausea or vomiting.   polyethylene glycol powder powder Commonly known as:  GLYCOLAX/MIRALAX Take 17 g by mouth daily as needed for mild  constipation.   QUEtiapine 25 MG tablet Commonly known as:  SEROQUEL Take 1 tablet (25 mg total) by mouth every morning. What changed:    how much to take  when to take this            Durable Medical Equipment  (From admission, onward)        Start     Ordered   03/27/18 1322  For home use only DME oxygen  Once    Question Answer Comment  Mode or (Route) Nasal cannula   Liters per Minute 5   Frequency Continuous (stationary and portable oxygen unit needed)   Oxygen conserving device Yes   Oxygen delivery system Gas      03/27/18 1321      Day of Discharge BP (!) 143/82   Pulse 68   Temp (!) 97.5 F (36.4 C) (Oral)   Resp 20   Ht _0  (1.6 m)   Wt 47.1 kg (103 lb 13.4 oz)   SpO2 (!) 89%   BMI 18.39 kg/m   Physical Exam: General: No acute respiratory distress - non-communicative  Lungs: poor air movement in all fields - no wheezing  Cardiovascular: Regular rate and rhythm without murmur Abdomen: Nondistended, soft, bowel sounds positive Extremities: No significant edema bilateral lower extremities  Basic Metabolic Panel: Recent Labs  Lab 03/24/18 2207 03/25/18 0317  NA 140 142  K 4.2 4.2  CL 92* 95*  CO2 42* 44*  GLUCOSE 175* 156*  BUN 13 14  CREATININE 0.74 0.76  CALCIUM 9.5 9.5    Liver Function Tests: Recent Labs  Lab 03/24/18 2207  AST 20  ALT 12*  ALKPHOS 62  BILITOT 0.8  PROT 5.5*  ALBUMIN 3.2*    CBC: Recent Labs  Lab 03/24/18 2207 03/25/18 0317  WBC 6.9 5.5  HGB 9.7* 9.4*  HCT 33.6* 33.1*  MCV 96.6 96.2  PLT 152 153    CBG: Recent Labs  Lab 03/25/18 1928 03/26/18 0018 03/26/18 0423 03/26/18 0856 03/26/18 1234  GLUCAP 112* 137* 95 103* 115*    Recent Results (from the past 240 hour(s))  MRSA PCR Screening     Status: None   Collection Time: 03/25/18  1:30 PM  Result Value Ref Range Status   MRSA by PCR NEGATIVE NEGATIVE Final    Comment:        The GeneXpert MRSA Assay (FDA approved for NASAL  specimens only), is one component of a comprehensive MRSA colonization surveillance program. It is not intended to diagnose MRSA infection nor to guide or monitor treatment for MRSA infections. Performed at [facility], [place] [address] [place], [place] [address]      Time spent in discharge (includes decision making & examination of pt): 30 minutes  03/27/2018, 1:22 PM   Cherene Altes, MD [facility] Office  [phone] Pager [phone]  On-Call/Text  Page:      [email or web address]      password [facility]

## 2018-03-27 NOTE — Progress Notes (Signed)
Patient received orders for discharge. Family called and made aware. This RN called the son and daughter in law. They are in agreement to have patient discharged. This RN reviewed discharged instructions over the phone with daughter in law, [place]. The son was unable to take the call and gave the OK to speak with his wife regarding his mother's discharged.  Family was given the option to leave foley in. Family gave the OK for patient to be discharged with foley. She reported she had no further questions and said she would be home to receive patient.

## 2018-03-28 ENCOUNTER — Telehealth: Payer: Self-pay | Admitting: Emergency Medicine

## 2018-03-28 NOTE — Telephone Encounter (Signed)
Transition Care Management Follow-up Telephone Call   Date discharged? 03/27/2018   How have you been since you were released from the hospital? Spoke with patient son [name] and he stated that patient is not doing good and she is not responding as well verbally since she left the hospital. Also the patient has not been eating well since she has been discharged from the hospital. [name] would like PCP to know that patient does have a colostomy bag now. Hospice will begin to come to patient house to assist in care.    Do you understand why you were in the hospital? yes   Do you understand the discharge instructions? yes   Where were you discharged to? Home    Items Reviewed:  Medications reviewed: yes, patient son stated that patient oxygen has been increased to 2 1/2 to 5 liters.   Allergies reviewed: yes  Dietary changes reviewed: no dietary changes were made for patient to continue when she was released home.   Referrals reviewed: yes, patients family decided to proceed with hospice.    Functional Questionnaire:   Activities of Daily Living (ADLs):   She states they are independent in the following: patient is not independent. She is assisted in daily ADL's States they require assistance with the following: All ADL's   Any transportation issues/concerns?: no, patient son decline an appointment due to patient condition.    Any patient concerns? Yes, patient son has concerns regarding patient not eating and would like to know what PCP recommends? [name] stated that patient did do well in the hospital on the liquid diet but is not tolerating the soft diet since she has been home.     Confirmed importance and date/time of follow-up visits scheduled yes, patient son decline appointment.     Confirmed with patient if condition begins to worsen call PCP or go to the ER.  Patient was given the office number and encouraged to call back with question or concerns.  : yes

## 2018-03-28 NOTE — Telephone Encounter (Signed)
Patient son is aware that PCP will fill out FMLA paperwork. Paperwork will be dropped off at the front desk.

## 2018-03-28 NOTE — Telephone Encounter (Signed)
Patient son would like to know if PCP would be willing to fill out FMLA paperwork. Also patient son decline a hospital follow up for patient due to current condition. Please advise.

## 2018-03-28 NOTE — Telephone Encounter (Signed)
I will be glad to complete FMLA paper for him.

## 2018-03-29 DIAGNOSIS — Z0279 Encounter for issue of other medical certificate: Secondary | ICD-10-CM

## 2018-04-06 ENCOUNTER — Telehealth: Payer: Self-pay | Admitting: Nurse Practitioner

## 2018-04-06 NOTE — Telephone Encounter (Signed)
I thought this patient had Hospice services at this time. Why is palliative care needed?

## 2018-04-06 NOTE — Telephone Encounter (Signed)
Copied from [place] [id number]. Topic: Quick Communication - See Telephone Encounter >> Apr 06, 2018 12:42 PM Synthia Innocent wrote: CRM for notification. See Telephone encounter for: 04/06/18. Requesting verbal order for Palliative Care for Nursing and Social Worker, provider agree? Please advise

## 2018-04-06 NOTE — Telephone Encounter (Signed)
Inez Catalina from [place] [phone] was calling back she states that she didn't know the pt was under hospice Care that she will let her provider know

## 2018-04-06 NOTE — Telephone Encounter (Signed)
Left vm for Samantha Zamora to call back, need a little more information for this order.   Samantha Zamora's family is not aware of this and they are happy with the hospice services.

## 2018-04-12 DIAGNOSIS — G309 Alzheimer's disease, unspecified: Secondary | ICD-10-CM | POA: Diagnosis not present

## 2018-04-12 DIAGNOSIS — R8290 Unspecified abnormal findings in urine: Secondary | ICD-10-CM | POA: Diagnosis not present

## 2018-04-12 DIAGNOSIS — F0280 Dementia in other diseases classified elsewhere without behavioral disturbance: Secondary | ICD-10-CM | POA: Diagnosis not present

## 2018-05-16 DIAGNOSIS — N390 Urinary tract infection, site not specified: Secondary | ICD-10-CM | POA: Diagnosis not present

## 2018-06-07 ENCOUNTER — Encounter: Payer: Self-pay | Admitting: Internal Medicine

## 2018-06-07 ENCOUNTER — Non-Acute Institutional Stay (SKILLED_NURSING_FACILITY): Payer: Medicare Other | Admitting: Internal Medicine

## 2018-06-07 DIAGNOSIS — F3289 Other specified depressive episodes: Secondary | ICD-10-CM

## 2018-06-07 DIAGNOSIS — Z9981 Dependence on supplemental oxygen: Secondary | ICD-10-CM

## 2018-06-07 DIAGNOSIS — F0391 Unspecified dementia with behavioral disturbance: Secondary | ICD-10-CM | POA: Diagnosis not present

## 2018-06-07 DIAGNOSIS — F39 Unspecified mood [affective] disorder: Secondary | ICD-10-CM

## 2018-06-07 DIAGNOSIS — J9612 Chronic respiratory failure with hypercapnia: Secondary | ICD-10-CM

## 2018-06-07 DIAGNOSIS — J439 Emphysema, unspecified: Secondary | ICD-10-CM | POA: Diagnosis not present

## 2018-06-07 DIAGNOSIS — F419 Anxiety disorder, unspecified: Secondary | ICD-10-CM | POA: Diagnosis not present

## 2018-06-07 DIAGNOSIS — J9611 Chronic respiratory failure with hypoxia: Secondary | ICD-10-CM

## 2018-06-07 NOTE — Progress Notes (Signed)
:  Location:  [facility] Room Number: 206D Place of Service:  SNF (31)  Noah Delaine. Sheppard Coil, MD  Patient Care Team: Nche, [name], NP as PCP - General (Internal Medicine)  Extended Emergency Contact Information Primary Emergency Contact: [place] of [place] Mobile Phone: [phone] Relation: Son Secondary Emergency Contact: Amelia Jo [place] of [place] Mobile Phone: [phone] Relation: Other     Allergies: Ibuprofen  Chief Complaint  Patient presents with  . Readmit To SNF    Admit to [facility]    HPI: Patient is 84 y.o. female with hypertension, diabetes mellitus type 2, dementia, osteoporosis, and end-stage COPD and hospice care who was admitted to [facility] on 06/07/2018 for respite care for 10 days.  Patient's last hospitalization was in April for acute on chronic hypoxic and hypercarbic respiratory failure.  It is recommended that patient get as little sedating medication as possible as this leads to worsening hypercarbia,  PCO2 last hospitalization was 107.Marland Kitchen  However patient does have dementia with agitation.  Patient is supposed use BiPAP nightly.  Today patient seems calm.  Past Medical History:  Diagnosis Date  . Acute respiratory failure ([place])   . Allergic rhinitis   . Anxiety   . Atrial fibrillation ([place])   . COPD (chronic obstructive pulmonary disease) ([place])    O2 dependent  . Dementia    early stage  . DM (diabetes mellitus) ([place])   . Eczema   . GERD (gastroesophageal reflux disease)   . HTN (hypertension), benign   . Hx of colon cancer, stage I   . Osteoporosis   . Unspecified mood (affective) disorder [facility])     Past Surgical History:  Procedure Laterality Date  . ABDOMINAL HYSTERECTOMY  1975  . COLON SURGERY  01/2010  . UMBILICAL HERNIA REPAIR  01/2010    Allergies as of 06/07/2018      Reactions   Ibuprofen Other (See Comments)   bleeding      Medication List        Accurate as of 06/07/18  4:15 PM. Always use your most recent med list.          acetaminophen 325 MG tablet Commonly known as:  TYLENOL Take 2 tablets (650 mg total) by mouth every 6 (six) hours as needed for mild pain (or Fever >/= 101).   ALPRAZolam 0.25 MG tablet Commonly known as:  XANAX Take 0.25 mg by mouth at bedtime as needed for anxiety.   aspirin EC 81 MG tablet Take 81 mg by mouth daily.   busPIRone 15 MG tablet Commonly known as:  BUSPAR Take 5 mg by mouth daily.   docusate sodium 100 MG capsule Commonly known as:  COLACE Take 1 capsule (100 mg total) by mouth 2 (two) times daily.   ipratropium-albuterol 0.5-2.5 (3) MG/3ML Soln Commonly known as:  DUONEB Take 3 mLs by nebulization every 6 (six) hours as needed.   LORazepam 1 MG tablet Commonly known as:  ATIVAN Take 0.5 mg by mouth every 4 (four) hours as needed for anxiety.   MORPHINE SULFATE (CONCENTRATE) PO Take 5 mg by mouth.   ondansetron 4 MG disintegrating tablet Commonly known as:  ZOFRAN-ODT TK 1 T PO Q 8 H PRN N/V   ondansetron 4 MG tablet Commonly known as:  ZOFRAN Take 4 mg by mouth every 8 (eight) hours as needed for nausea or vomiting.   OXYGEN Inhale into the lungs. HUMIDIFIED   polyethylene  glycol powder powder Commonly known as:  GLYCOLAX/MIRALAX Take 17 g by mouth daily as needed for mild constipation.   REMERON 15 MG tablet Generic drug:  mirtazapine Take 15 mg by mouth at bedtime.   sennosides-docusate sodium 8.6-50 MG tablet Commonly known as:  SENOKOT-S Take 1 tablet by mouth daily.   SEROQUEL 50 MG tablet Generic drug:  QUEtiapine Take 50 mg by mouth 2 (two) times daily.       No orders of the defined types were placed in this encounter.   Immunization History  Administered Date(s) Administered  . Pneumococcal-Unspecified 05/20/2010  . Td 10/11/2013  . Zoster 05/20/2010    Social History   Tobacco Use  . Smoking status: Former Smoker    Packs/day:  2.00    Years: 30.00    Pack years: 60.00  . Smokeless tobacco: Never Used  . Tobacco comment: quit smoking over 50 years ago  Substance Use Topics  . Alcohol use: No    Family history is   Family History  Problem Relation Age of Onset  . Aneurysm Mother       Review of Systems  DATA OBTAINED: from patient-very limited; nursing- no acute concerns GENERAL:  no fevers, fatigue, appetite changes SKIN: No itching, or rash EYES: No eye pain, redness, discharge EARS: No earache, tinnitus, change in hearing NOSE: No congestion, drainage or bleeding  MOUTH/THROAT: No mouth or tooth pain, No sore throat RESPIRATORY: No cough, wheezing, SOB CARDIAC: No chest pain, palpitations, lower extremity edema  GI: No abdominal pain, No N/V/D or constipation, No heartburn or reflux  GU: No dysuria, frequency or urgency, or incontinence  MUSCULOSKELETAL: No unrelieved bone/joint pain NEUROLOGIC: No headache, dizziness or focal weakness PSYCHIATRIC: No c/o anxiety or sadness   Vitals:   06/07/18 1613  BP: 120/66  Pulse: 78  Resp: 18  Temp: (!) 97 F (36.1 C)    SpO2 Readings from Last 1 Encounters:  03/27/18 (!) 89%   Body mass index is 18.25 kg/m.     Physical Exam  GENERAL APPEARANCE: Alert, minimally conversant,  No acute distress.  SKIN: No diaphoresis rash HEAD: Normocephalic, atraumatic  EYES: Conjunctiva/lids clear. Pupils round, reactive. EOMs intact.  EARS: External exam WNL, canals clear. Hearing grossly normal.  NOSE: No deformity or discharge.  MOUTH/THROAT: Lips w/o lesions  RESPIRATORY: Breathing is even, unlabored. Lung sounds are diffusely decreased CARDIOVASCULAR: Heart RRR no murmurs, rubs or gallops. No peripheral edema.   GASTROINTESTINAL: Abdomen is soft, non-tender, not distended w/ normal bowel sounds. GENITOURINARY: Bladder non tender, not distended  MUSCULOSKELETAL: No abnormal joints or musculature except very thin NEUROLOGIC:  Cranial nerves 2-12  grossly intact. Moves all extremities  PSYCHIATRIC: Mood and affect appropriate to situation with dementia, no behavioral issues  Patient Active Problem List   Diagnosis Date Noted  . Pressure injury of skin 03/26/2018  . Acute on chronic respiratory failure with hypoxia and hypercapnia ([place]) 03/25/2018  . Acute respiratory failure with hypoxia and hypercapnia ([place]) 02/14/2018  . Chronic anemia 02/10/2018  . Mood disorder ([place]) 02/10/2018  . Chronic respiratory failure with hypoxia ([place]) 02/01/2018  . AKI (acute kidney injury) ([place]) 02/01/2018  . Thrombocytopenia ([place]) 02/01/2018  . DM type 2 (diabetes mellitus, type 2) ([place]) 02/01/2018  . Hematuria 02/01/2018  . Hypoxia   . Dementia 03/31/2017  . Anxiety 03/31/2017  . Depression 03/31/2017  . Chronic rhinitis 03/24/2017  . 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 COPD (chronic obstructive pulmonary disease) ([place]) 03/20/2017  Labs reviewed: Basic Metabolic Panel:    Component Value Date/Time   NA 142 03/25/2018 0317   NA 146 02/06/2018   K 4.2 03/25/2018 0317   K 3.8 02/06/2018   CL 95 (L) 03/25/2018 0317   CO2 44 (H) 03/25/2018 0317   GLUCOSE 156 (H) 03/25/2018 0317   BUN 14 03/25/2018 0317   BUN 21 02/06/2018   CREATININE 0.76 03/25/2018 0317   CREATININE 1.23 02/06/2018   CALCIUM 9.5 03/25/2018 0317   CALCIUM 8.6 02/06/2018   PROT 5.5 (L) 03/24/2018 2207   ALBUMIN 3.2 (L) 03/24/2018 2207   AST 20 03/24/2018 2207   ALT 12 (L) 03/24/2018 2207   ALKPHOS 62 03/24/2018 2207   BILITOT 0.8 03/24/2018 2207   GFRNONAA >60 03/25/2018 0317   GFRAA >60 03/25/2018 0317    Recent Labs    01/27/18 1556  02/21/18 1243 03/24/18 2207 03/25/18 0317  NA  --    < > 140 140 142  K  --    < > 4.8 4.2 4.2  CL  --    < > 96 92* 95*  CO2  --    < > 38* 42* 44*  GLUCOSE  --    < > 171* 175* 156*  BUN  --    < > 17 13 14   CREATININE  --    < > 0.77 0.74 0.76  CALCIUM  --    < > 9.1 9.5 9.5  MG 2.3  --   --   --   --   PHOS 4.1  --    --   --   --    < > = values in this interval not displayed.   Liver Function Tests: Recent Labs    09/06/17 1409 01/14/18 0756 03/24/18 2207  AST 22 41 20  ALT 13* 20 12*  ALKPHOS 64 68 62  BILITOT 0.6 0.8 0.8  PROT 6.4* 6.4* 5.5*  ALBUMIN 3.5 3.4* 3.2*   No results for input(s): LIPASE, AMYLASE in the last 8760 hours. No results for input(s): AMMONIA in the last 8760 hours. CBC: Recent Labs    01/16/18 0213 01/17/18 0702 01/27/18 1152  02/21/18 1243 03/24/18 2207 03/25/18 0317  WBC 5.2 4.6 11.2*   < > 3.5* 6.9 5.5  NEUTROABS 4.4 3.1 8.5*  --   --   --   --   HGB 8.9* 9.4* 11.1*   < > 8.5 Repeated and verified X2.* 9.7* 9.4*  HCT 30.6* 31.9* 37.1   < > 27.4* 33.6* 33.1*  MCV 93.6 93.0 93.7   < > 91.1 96.6 96.2  PLT 124* 143* 163   < > 143.0* 152 153   < > = values in this interval not displayed.   Lipid No results for input(s): CHOL, HDL, LDLCALC, TRIG in the last 8760 hours.  Cardiac Enzymes: No results for input(s): CKTOTAL, CKMB, CKMBINDEX, TROPONINI in the last 8760 hours. BNP: Recent Labs    03/24/18 2207  BNP 100.6*   No results found for: [facility] Lab Results  Component Value Date   HGBA1C 6.3 02/21/2018   Lab Results  Component Value Date   TSH 0.84 03/20/2017   No results found for: VITAMINB12 No results found for: FOLATE No results found for: IRON, TIBC, FERRITIN  Imaging and Procedures obtained prior to SNF admission: Dg Chest Portable 1 View  Result Date: 03/24/2018 CLINICAL DATA:  Shortness of breath. Respiratory distress. Found unresponsive. Improved respiration and responsiveness with repositioning.  EXAM: PORTABLE CHEST 1 VIEW COMPARISON:  01/31/2018 FINDINGS: Mild cardiac enlargement. No vascular congestion or edema. Emphysematous changes in the lungs. Probable small bilateral pleural effusions. No pneumothorax. Mediastinal contours appear intact. Calcification of the aorta. Degenerative changes in the spine and shoulders. Old bilateral  rib fractures. IMPRESSION: Mild cardiac enlargement. No vascular congestion or edema. Small bilateral pleural effusions. Emphysematous changes in the lungs. Aortic atherosclerosis. Electronically Signed   By: Lucienne Capers M.D.   On: 03/24/2018 22:34     Not all labs, radiology exams or other studies done during hospitalization come through on my EPIC note; however they are reviewed by me.    Assessment and Plan  End-stage COPD with hypercarbia- patient to use BiPAP at night for CO2 control; watch for too much sedation which will increase her hypercarbia or mean that she already has too much hypercarbia me: Continue DuoNeb twice daily and O2 3 L nasal cannula continuously  Dementia with mood disorder-continue Seroquel 50 mg twice daily  Anxiety- continue alprazolam 0.25 mg 1 tablet as needed 3 times daily and BuSpar 5 mg 1 tab twice daily scheduled  Depression- continue Remeron 15 mg nightly  Constipation- continue senna S2 tabs 3 times daily and MiraLAX 17 g daily   Spent greater than 35 minutes;> 50% of time with patient was spent reviewing records, labs, tests and studies, counseling and developing plan of care  Webb Silversmith D. Sheppard Coil, MD

## 2018-06-10 ENCOUNTER — Encounter: Payer: Self-pay | Admitting: Internal Medicine

## 2018-06-20 DIAGNOSIS — J449 Chronic obstructive pulmonary disease, unspecified: Secondary | ICD-10-CM | POA: Diagnosis not present

## 2018-06-28 DIAGNOSIS — F39 Unspecified mood [affective] disorder: Secondary | ICD-10-CM | POA: Diagnosis not present

## 2018-06-28 DIAGNOSIS — F419 Anxiety disorder, unspecified: Secondary | ICD-10-CM | POA: Diagnosis not present

## 2018-06-28 DIAGNOSIS — I4891 Unspecified atrial fibrillation: Secondary | ICD-10-CM | POA: Diagnosis not present

## 2018-06-28 DIAGNOSIS — J449 Chronic obstructive pulmonary disease, unspecified: Secondary | ICD-10-CM | POA: Diagnosis not present

## 2018-06-28 DIAGNOSIS — D649 Anemia, unspecified: Secondary | ICD-10-CM | POA: Diagnosis not present

## 2018-06-28 DIAGNOSIS — E119 Type 2 diabetes mellitus without complications: Secondary | ICD-10-CM | POA: Diagnosis not present

## 2018-06-28 DIAGNOSIS — J9601 Acute respiratory failure with hypoxia: Secondary | ICD-10-CM | POA: Diagnosis not present

## 2018-06-28 DIAGNOSIS — I10 Essential (primary) hypertension: Secondary | ICD-10-CM | POA: Diagnosis not present

## 2018-06-28 DIAGNOSIS — F0281 Dementia in other diseases classified elsewhere with behavioral disturbance: Secondary | ICD-10-CM | POA: Diagnosis not present

## 2018-07-02 DIAGNOSIS — F0281 Dementia in other diseases classified elsewhere with behavioral disturbance: Secondary | ICD-10-CM | POA: Diagnosis not present

## 2018-07-02 DIAGNOSIS — I4891 Unspecified atrial fibrillation: Secondary | ICD-10-CM | POA: Diagnosis not present

## 2018-07-02 DIAGNOSIS — J9601 Acute respiratory failure with hypoxia: Secondary | ICD-10-CM | POA: Diagnosis not present

## 2018-07-02 DIAGNOSIS — I10 Essential (primary) hypertension: Secondary | ICD-10-CM | POA: Diagnosis not present

## 2018-07-02 DIAGNOSIS — E119 Type 2 diabetes mellitus without complications: Secondary | ICD-10-CM | POA: Diagnosis not present

## 2018-07-02 DIAGNOSIS — J449 Chronic obstructive pulmonary disease, unspecified: Secondary | ICD-10-CM | POA: Diagnosis not present

## 2018-07-04 DIAGNOSIS — E119 Type 2 diabetes mellitus without complications: Secondary | ICD-10-CM | POA: Diagnosis not present

## 2018-07-04 DIAGNOSIS — J449 Chronic obstructive pulmonary disease, unspecified: Secondary | ICD-10-CM | POA: Diagnosis not present

## 2018-07-04 DIAGNOSIS — F0281 Dementia in other diseases classified elsewhere with behavioral disturbance: Secondary | ICD-10-CM | POA: Diagnosis not present

## 2018-07-04 DIAGNOSIS — I4891 Unspecified atrial fibrillation: Secondary | ICD-10-CM | POA: Diagnosis not present

## 2018-07-04 DIAGNOSIS — J9601 Acute respiratory failure with hypoxia: Secondary | ICD-10-CM | POA: Diagnosis not present

## 2018-07-04 DIAGNOSIS — I10 Essential (primary) hypertension: Secondary | ICD-10-CM | POA: Diagnosis not present

## 2018-07-05 DIAGNOSIS — F0281 Dementia in other diseases classified elsewhere with behavioral disturbance: Secondary | ICD-10-CM | POA: Diagnosis not present

## 2018-07-05 DIAGNOSIS — E119 Type 2 diabetes mellitus without complications: Secondary | ICD-10-CM | POA: Diagnosis not present

## 2018-07-05 DIAGNOSIS — J9601 Acute respiratory failure with hypoxia: Secondary | ICD-10-CM | POA: Diagnosis not present

## 2018-07-05 DIAGNOSIS — I10 Essential (primary) hypertension: Secondary | ICD-10-CM | POA: Diagnosis not present

## 2018-07-05 DIAGNOSIS — J449 Chronic obstructive pulmonary disease, unspecified: Secondary | ICD-10-CM | POA: Diagnosis not present

## 2018-07-05 DIAGNOSIS — I4891 Unspecified atrial fibrillation: Secondary | ICD-10-CM | POA: Diagnosis not present

## 2018-07-06 DIAGNOSIS — F0281 Dementia in other diseases classified elsewhere with behavioral disturbance: Secondary | ICD-10-CM | POA: Diagnosis not present

## 2018-07-06 DIAGNOSIS — J449 Chronic obstructive pulmonary disease, unspecified: Secondary | ICD-10-CM | POA: Diagnosis not present

## 2018-07-06 DIAGNOSIS — J9601 Acute respiratory failure with hypoxia: Secondary | ICD-10-CM | POA: Diagnosis not present

## 2018-07-06 DIAGNOSIS — E119 Type 2 diabetes mellitus without complications: Secondary | ICD-10-CM | POA: Diagnosis not present

## 2018-07-06 DIAGNOSIS — I10 Essential (primary) hypertension: Secondary | ICD-10-CM | POA: Diagnosis not present

## 2018-07-06 DIAGNOSIS — I4891 Unspecified atrial fibrillation: Secondary | ICD-10-CM | POA: Diagnosis not present

## 2018-07-07 DIAGNOSIS — E119 Type 2 diabetes mellitus without complications: Secondary | ICD-10-CM | POA: Diagnosis not present

## 2018-07-07 DIAGNOSIS — I4891 Unspecified atrial fibrillation: Secondary | ICD-10-CM | POA: Diagnosis not present

## 2018-07-07 DIAGNOSIS — J9601 Acute respiratory failure with hypoxia: Secondary | ICD-10-CM | POA: Diagnosis not present

## 2018-07-07 DIAGNOSIS — J449 Chronic obstructive pulmonary disease, unspecified: Secondary | ICD-10-CM | POA: Diagnosis not present

## 2018-07-07 DIAGNOSIS — I10 Essential (primary) hypertension: Secondary | ICD-10-CM | POA: Diagnosis not present

## 2018-07-07 DIAGNOSIS — F0281 Dementia in other diseases classified elsewhere with behavioral disturbance: Secondary | ICD-10-CM | POA: Diagnosis not present

## 2018-07-08 DIAGNOSIS — J449 Chronic obstructive pulmonary disease, unspecified: Secondary | ICD-10-CM | POA: Diagnosis not present

## 2018-07-08 DIAGNOSIS — E119 Type 2 diabetes mellitus without complications: Secondary | ICD-10-CM | POA: Diagnosis not present

## 2018-07-08 DIAGNOSIS — F0281 Dementia in other diseases classified elsewhere with behavioral disturbance: Secondary | ICD-10-CM | POA: Diagnosis not present

## 2018-07-08 DIAGNOSIS — I10 Essential (primary) hypertension: Secondary | ICD-10-CM | POA: Diagnosis not present

## 2018-07-08 DIAGNOSIS — I4891 Unspecified atrial fibrillation: Secondary | ICD-10-CM | POA: Diagnosis not present

## 2018-07-08 DIAGNOSIS — J9601 Acute respiratory failure with hypoxia: Secondary | ICD-10-CM | POA: Diagnosis not present

## 2018-07-09 DIAGNOSIS — E119 Type 2 diabetes mellitus without complications: Secondary | ICD-10-CM | POA: Diagnosis not present

## 2018-07-09 DIAGNOSIS — J9601 Acute respiratory failure with hypoxia: Secondary | ICD-10-CM | POA: Diagnosis not present

## 2018-07-09 DIAGNOSIS — F0281 Dementia in other diseases classified elsewhere with behavioral disturbance: Secondary | ICD-10-CM | POA: Diagnosis not present

## 2018-07-09 DIAGNOSIS — J449 Chronic obstructive pulmonary disease, unspecified: Secondary | ICD-10-CM | POA: Diagnosis not present

## 2018-07-09 DIAGNOSIS — I4891 Unspecified atrial fibrillation: Secondary | ICD-10-CM | POA: Diagnosis not present

## 2018-07-09 DIAGNOSIS — I10 Essential (primary) hypertension: Secondary | ICD-10-CM | POA: Diagnosis not present

## 2018-07-10 DIAGNOSIS — F0281 Dementia in other diseases classified elsewhere with behavioral disturbance: Secondary | ICD-10-CM | POA: Diagnosis not present

## 2018-07-10 DIAGNOSIS — J9601 Acute respiratory failure with hypoxia: Secondary | ICD-10-CM | POA: Diagnosis not present

## 2018-07-10 DIAGNOSIS — I10 Essential (primary) hypertension: Secondary | ICD-10-CM | POA: Diagnosis not present

## 2018-07-10 DIAGNOSIS — I4891 Unspecified atrial fibrillation: Secondary | ICD-10-CM | POA: Diagnosis not present

## 2018-07-10 DIAGNOSIS — J449 Chronic obstructive pulmonary disease, unspecified: Secondary | ICD-10-CM | POA: Diagnosis not present

## 2018-07-10 DIAGNOSIS — E119 Type 2 diabetes mellitus without complications: Secondary | ICD-10-CM | POA: Diagnosis not present

## 2018-08-15 IMAGING — CT CT HEAD W/O CM
3 of 4 series · 15 of 47 positions shown, 18 images · non-contrast
Comparison: None.

CLINICAL DATA: Altered mental status for 5 days.

EXAM:
CT HEAD WITHOUT CONTRAST
TECHNIQUE: Contiguous axial images were obtained from the base of the skull
through the vertex without intravenous contrast.

[Series 2: head w/o · axial · non-contrast · 0.43mm/px · z∈[-43,+72]mm · 9 of 29 slices shown, 12 images]
[im 3/29  brain]
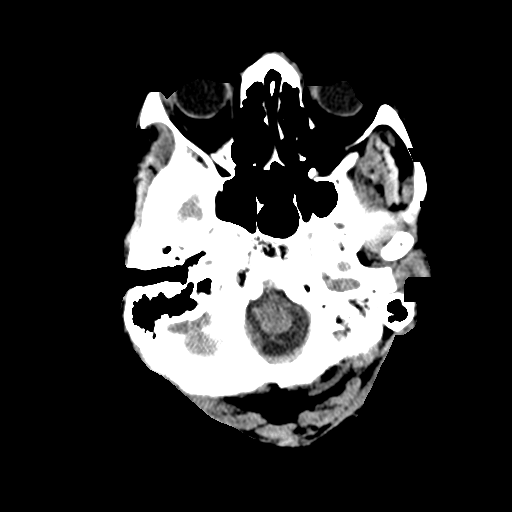
[im 3/29  bone]
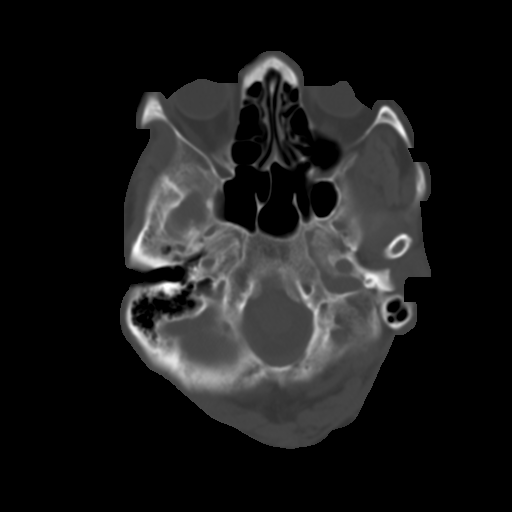
[im 6/29  brain]
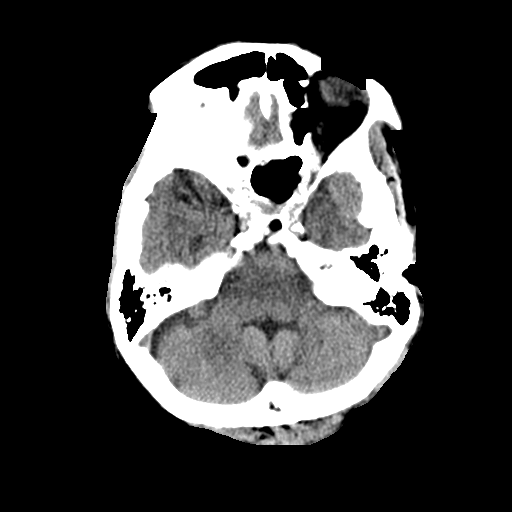
[im 9/29  brain]
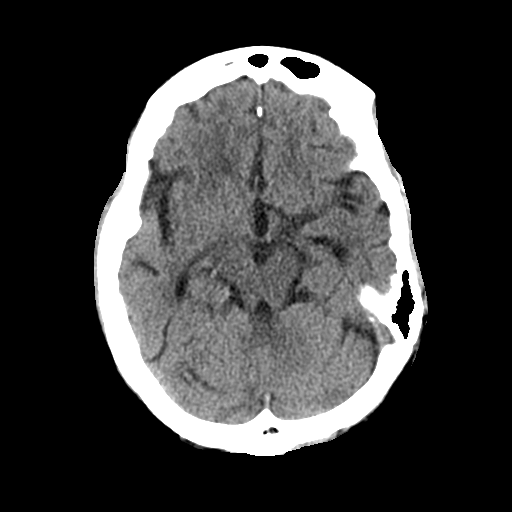
[im 12/29  brain]
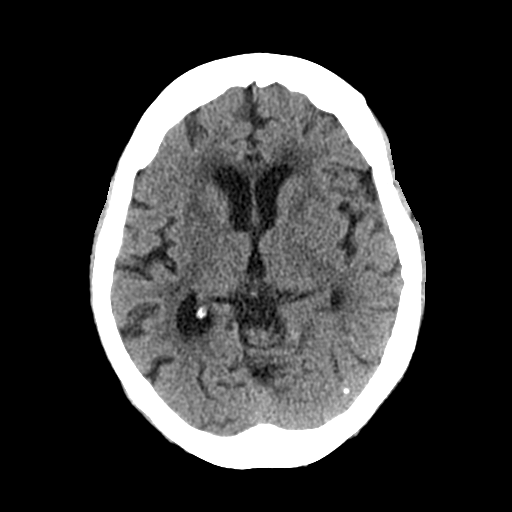
[im 15/29  brain]
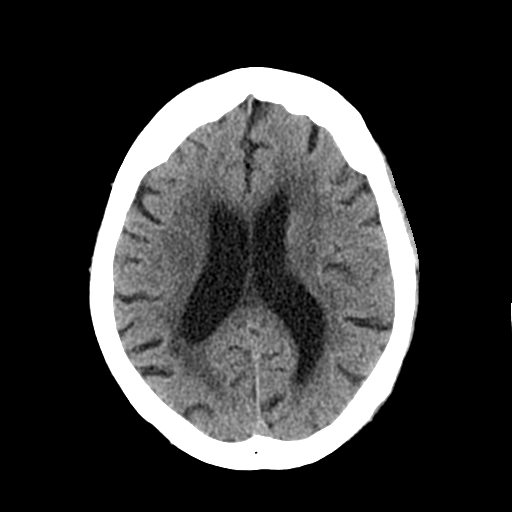
[im 15/29  bone]
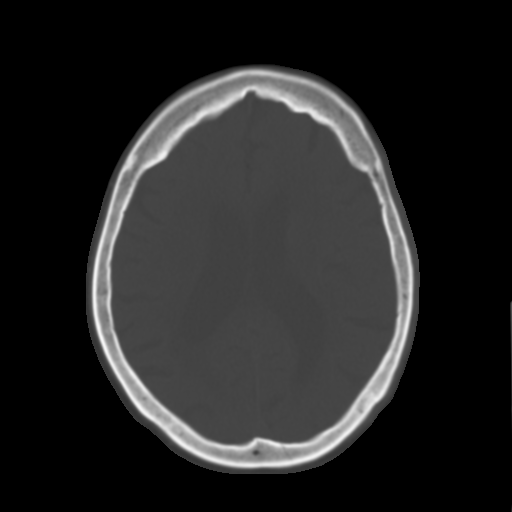
[im 17/29  brain]
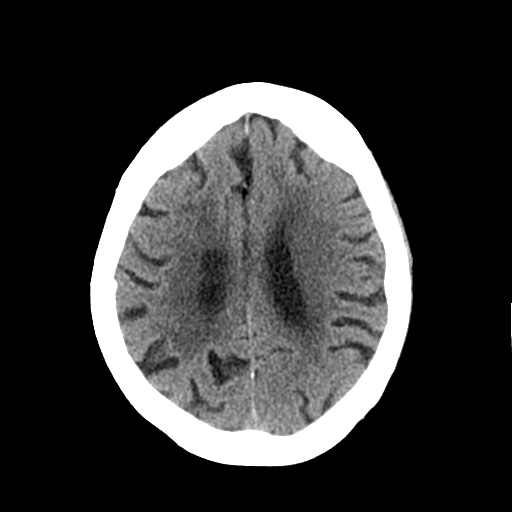
[im 20/29  brain]
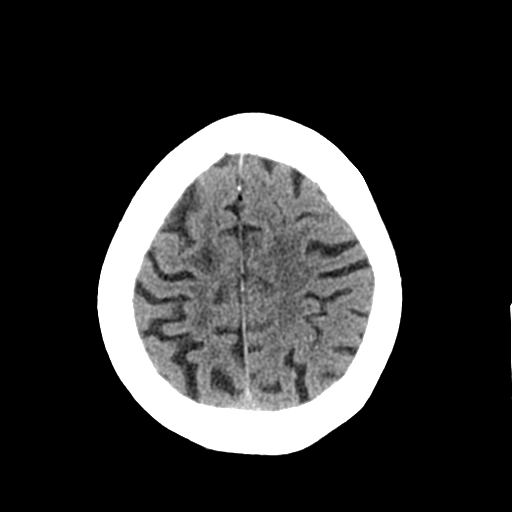
[im 23/29  brain]
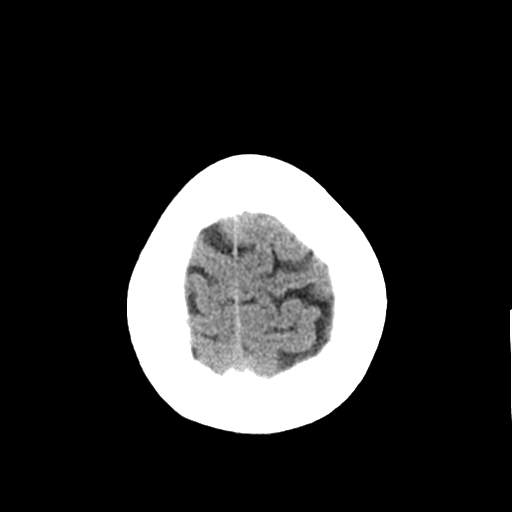
[im 26/29  brain]
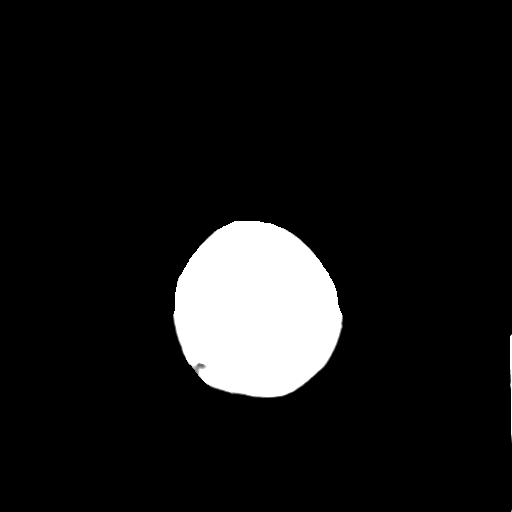
[im 26/29  bone]
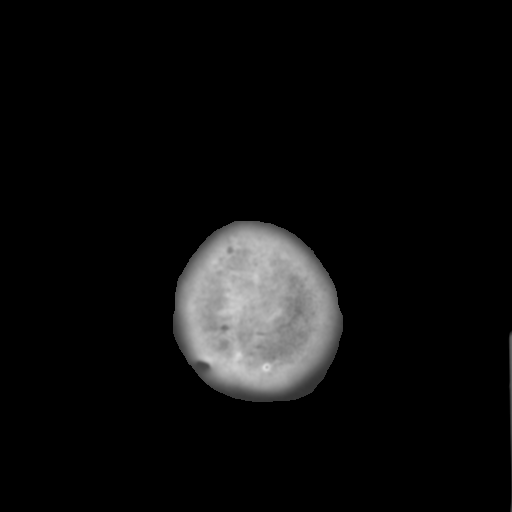

[Series 4: coronal · coronal · 0.28mm/px · 3 of 61 slices shown]
[im 21/61  brain]
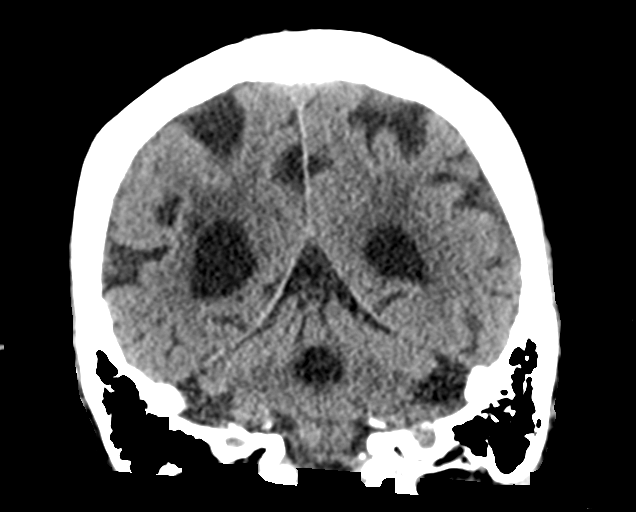
[im 27/61  brain]
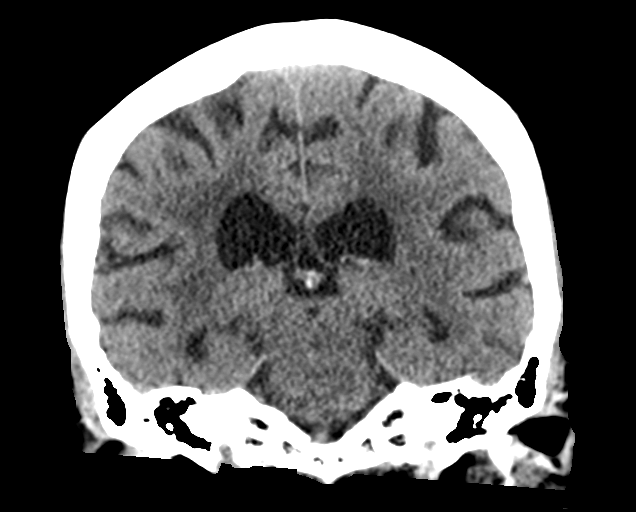
[im 34/61  brain]
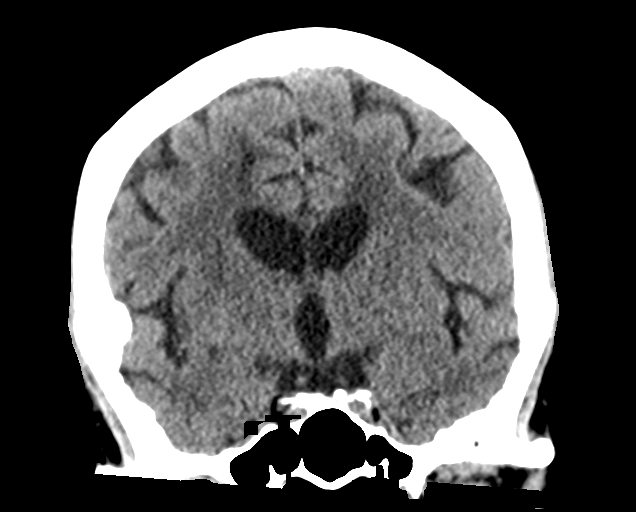

[Series 5: sagittal · sagittal · 0.30mm/px · 3 of 54 slices shown]
[im 18/54  brain]
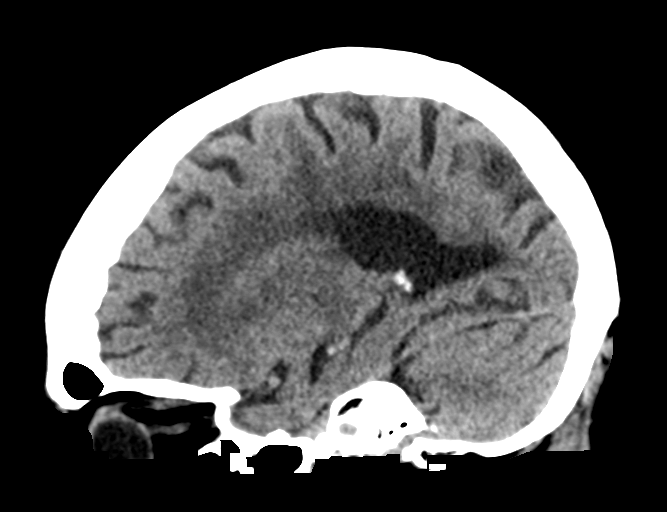
[im 27/54  brain]
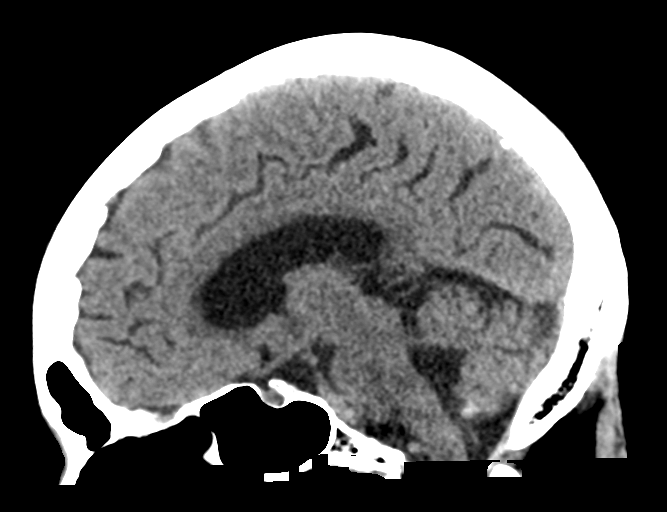
[im 36/54  brain]
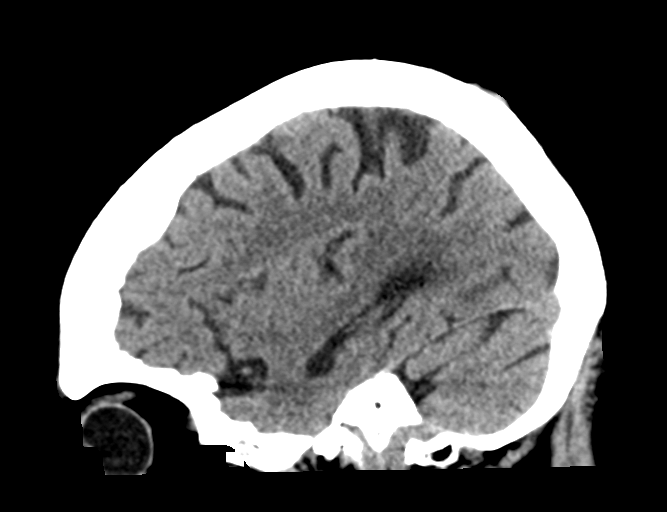

[15 of 47 positions shown; findings below may reference images not displayed]

FINDINGS: Brain: Stable age related cerebral atrophy, ventriculomegaly and
periventricular white matter disease. No extra-axial fluid
collections are identified. No CT findings for acute hemispheric
infarction or intracranial hemorrhage. No mass lesions. The
brainstem and cerebellum are normal.

Vascular: No hyperdense vessels or obvious aneurysm. Moderate
scattered atherosclerotic calcifications.

Skull: No acute fracture or bone lesion.

Sinuses/Orbits: The paranasal sinuses and mastoid air cells are
clear. The globes are intact.

Other: No scalp lesions or hematoma.
IMPRESSION: Age related cerebral atrophy, ventriculomegaly and periventricular
white matter disease. No definite acute infarction and no
intracranial hemorrhage or mass.
